# Patient Record
Sex: Female | Born: 1965
Health system: Southern US, Community
[De-identification: ages and names within clinical notes are randomized; demographics above are authoritative.]

## PROBLEM LIST (undated history)

## (undated) DIAGNOSIS — E785 Hyperlipidemia, unspecified: Secondary | ICD-10-CM

## (undated) DIAGNOSIS — N920 Excessive and frequent menstruation with regular cycle: Secondary | ICD-10-CM

## (undated) DIAGNOSIS — G5602 Carpal tunnel syndrome, left upper limb: Secondary | ICD-10-CM

## (undated) DIAGNOSIS — I10 Essential (primary) hypertension: Secondary | ICD-10-CM

## (undated) DIAGNOSIS — K439 Ventral hernia without obstruction or gangrene: Secondary | ICD-10-CM

## (undated) DIAGNOSIS — K5900 Constipation, unspecified: Secondary | ICD-10-CM

## (undated) DIAGNOSIS — M25511 Pain in right shoulder: Secondary | ICD-10-CM

## (undated) DIAGNOSIS — R0989 Other specified symptoms and signs involving the circulatory and respiratory systems: Secondary | ICD-10-CM

## (undated) DIAGNOSIS — M5030 Other cervical disc degeneration, unspecified cervical region: Secondary | ICD-10-CM

## (undated) DIAGNOSIS — R1013 Epigastric pain: Secondary | ICD-10-CM

## (undated) DIAGNOSIS — L0292 Furuncle, unspecified: Secondary | ICD-10-CM

## (undated) DIAGNOSIS — R011 Cardiac murmur, unspecified: Secondary | ICD-10-CM

## (undated) DIAGNOSIS — T7840XA Allergy, unspecified, initial encounter: Secondary | ICD-10-CM

## (undated) DIAGNOSIS — M25551 Pain in right hip: Secondary | ICD-10-CM

## (undated) DIAGNOSIS — M654 Radial styloid tenosynovitis [de Quervain]: Secondary | ICD-10-CM

## (undated) DIAGNOSIS — R12 Heartburn: Secondary | ICD-10-CM

## (undated) DIAGNOSIS — Z124 Encounter for screening for malignant neoplasm of cervix: Secondary | ICD-10-CM

## (undated) DIAGNOSIS — N83201 Unspecified ovarian cyst, right side: Secondary | ICD-10-CM

## (undated) DIAGNOSIS — R569 Unspecified convulsions: Secondary | ICD-10-CM

## (undated) DIAGNOSIS — M47812 Spondylosis without myelopathy or radiculopathy, cervical region: Secondary | ICD-10-CM

## (undated) DIAGNOSIS — R002 Palpitations: Secondary | ICD-10-CM

## (undated) DIAGNOSIS — R634 Abnormal weight loss: Secondary | ICD-10-CM

## (undated) DIAGNOSIS — R51 Headache: Secondary | ICD-10-CM

## (undated) HISTORY — DX: Essential (primary) hypertension: I10

## (undated) HISTORY — DX: Other specified symptoms and signs involving the circulatory and respiratory systems: R09.89

## (undated) HISTORY — DX: Allergy, unspecified, initial encounter: T78.40XA

## (undated) HISTORY — DX: Hyperlipidemia, unspecified: E78.5

## (undated) HISTORY — DX: Epigastric pain: R10.13

## (undated) HISTORY — DX: Encounter for screening for malignant neoplasm of cervix: Z12.4

## (undated) HISTORY — DX: Pain in right shoulder: M25.511

## (undated) HISTORY — DX: Cardiac murmur, unspecified: R01.1

---
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
## 1998-03-03 ENCOUNTER — Emergency Department (HOSPITAL_COMMUNITY): Admission: EM | Admit: 1998-03-03 | Discharge: 1998-03-03 | Payer: Self-pay | Admitting: Emergency Medicine

## 2000-04-14 ENCOUNTER — Emergency Department (HOSPITAL_COMMUNITY): Admission: EM | Admit: 2000-04-14 | Discharge: 2000-04-14 | Payer: Self-pay | Admitting: Emergency Medicine

## 2000-04-18 ENCOUNTER — Encounter: Admission: RE | Admit: 2000-04-18 | Discharge: 2000-04-18 | Payer: Self-pay | Admitting: Internal Medicine

## 2000-05-16 ENCOUNTER — Encounter: Admission: RE | Admit: 2000-05-16 | Discharge: 2000-05-16 | Payer: Self-pay | Admitting: Internal Medicine

## 2000-05-30 ENCOUNTER — Encounter: Admission: RE | Admit: 2000-05-30 | Discharge: 2000-05-30 | Payer: Self-pay | Admitting: Internal Medicine

## 2001-03-30 ENCOUNTER — Encounter: Admission: RE | Admit: 2001-03-30 | Discharge: 2001-03-30 | Payer: Self-pay

## 2001-06-29 ENCOUNTER — Encounter: Admission: RE | Admit: 2001-06-29 | Discharge: 2001-06-29 | Payer: Self-pay | Admitting: Obstetrics

## 2002-03-06 ENCOUNTER — Encounter: Admission: RE | Admit: 2002-03-06 | Discharge: 2002-03-06 | Payer: Self-pay | Admitting: Internal Medicine

## 2002-05-15 ENCOUNTER — Encounter: Admission: RE | Admit: 2002-05-15 | Discharge: 2002-05-15 | Payer: Self-pay | Admitting: Internal Medicine

## 2002-05-17 ENCOUNTER — Encounter: Admission: RE | Admit: 2002-05-17 | Discharge: 2002-05-17 | Payer: Self-pay | Admitting: Internal Medicine

## 2002-06-18 ENCOUNTER — Encounter: Admission: RE | Admit: 2002-06-18 | Discharge: 2002-06-18 | Payer: Self-pay | Admitting: Internal Medicine

## 2002-06-27 ENCOUNTER — Encounter: Admission: RE | Admit: 2002-06-27 | Discharge: 2002-06-27 | Payer: Self-pay | Admitting: Internal Medicine

## 2003-08-20 ENCOUNTER — Encounter: Admission: RE | Admit: 2003-08-20 | Discharge: 2003-08-20 | Payer: Self-pay | Admitting: Internal Medicine

## 2003-12-08 ENCOUNTER — Emergency Department (HOSPITAL_COMMUNITY): Admission: EM | Admit: 2003-12-08 | Discharge: 2003-12-08 | Payer: Self-pay | Admitting: *Deleted

## 2004-03-02 ENCOUNTER — Emergency Department (HOSPITAL_COMMUNITY): Admission: EM | Admit: 2004-03-02 | Discharge: 2004-03-03 | Payer: Self-pay | Admitting: Emergency Medicine

## 2004-03-03 ENCOUNTER — Emergency Department (HOSPITAL_COMMUNITY): Admission: EM | Admit: 2004-03-03 | Discharge: 2004-03-03 | Payer: Self-pay | Admitting: Family Medicine

## 2004-03-06 ENCOUNTER — Emergency Department (HOSPITAL_COMMUNITY): Admission: EM | Admit: 2004-03-06 | Discharge: 2004-03-06 | Payer: Self-pay | Admitting: Emergency Medicine

## 2004-10-29 ENCOUNTER — Encounter (INDEPENDENT_AMBULATORY_CARE_PROVIDER_SITE_OTHER): Payer: Self-pay | Admitting: Internal Medicine

## 2004-10-29 ENCOUNTER — Ambulatory Visit (HOSPITAL_COMMUNITY): Admission: RE | Admit: 2004-10-29 | Discharge: 2004-10-29 | Payer: Self-pay | Admitting: *Deleted

## 2004-11-27 ENCOUNTER — Ambulatory Visit: Payer: Self-pay | Admitting: Internal Medicine

## 2005-10-15 ENCOUNTER — Ambulatory Visit: Payer: Self-pay | Admitting: Internal Medicine

## 2005-11-17 ENCOUNTER — Ambulatory Visit: Payer: Self-pay | Admitting: Internal Medicine

## 2005-11-24 ENCOUNTER — Ambulatory Visit: Payer: Self-pay | Admitting: Internal Medicine

## 2005-11-24 ENCOUNTER — Encounter (INDEPENDENT_AMBULATORY_CARE_PROVIDER_SITE_OTHER): Payer: Self-pay | Admitting: Internal Medicine

## 2006-05-20 DIAGNOSIS — I10 Essential (primary) hypertension: Secondary | ICD-10-CM | POA: Insufficient documentation

## 2006-05-20 DIAGNOSIS — J301 Allergic rhinitis due to pollen: Secondary | ICD-10-CM | POA: Insufficient documentation

## 2006-05-20 DIAGNOSIS — R519 Headache, unspecified: Secondary | ICD-10-CM | POA: Insufficient documentation

## 2006-05-20 DIAGNOSIS — R51 Headache: Secondary | ICD-10-CM

## 2006-06-14 DIAGNOSIS — E785 Hyperlipidemia, unspecified: Secondary | ICD-10-CM

## 2006-06-14 DIAGNOSIS — G8929 Other chronic pain: Secondary | ICD-10-CM

## 2006-06-14 HISTORY — DX: Hyperlipidemia, unspecified: E78.5

## 2006-06-14 HISTORY — DX: Other chronic pain: G89.29

## 2007-01-17 ENCOUNTER — Telehealth (INDEPENDENT_AMBULATORY_CARE_PROVIDER_SITE_OTHER): Payer: Self-pay | Admitting: *Deleted

## 2007-02-15 ENCOUNTER — Telehealth (INDEPENDENT_AMBULATORY_CARE_PROVIDER_SITE_OTHER): Payer: Self-pay | Admitting: *Deleted

## 2007-03-06 ENCOUNTER — Encounter (INDEPENDENT_AMBULATORY_CARE_PROVIDER_SITE_OTHER): Payer: Self-pay | Admitting: Internal Medicine

## 2007-03-13 ENCOUNTER — Encounter (INDEPENDENT_AMBULATORY_CARE_PROVIDER_SITE_OTHER): Payer: Self-pay | Admitting: Internal Medicine

## 2007-03-13 ENCOUNTER — Ambulatory Visit: Payer: Self-pay | Admitting: Internal Medicine

## 2007-03-14 LAB — CONVERTED CEMR LAB
ALT: 18 units/L (ref 0–35)
AST: 16 units/L (ref 0–37)
Alkaline Phosphatase: 50 units/L (ref 39–117)
CO2: 24 meq/L (ref 19–32)
Calcium: 9.3 mg/dL (ref 8.4–10.5)
Chloride: 103 meq/L (ref 96–112)
Creatinine, Ser: 0.72 mg/dL (ref 0.40–1.20)
Eosinophils Relative: 3 % (ref 0–5)
Lymphocytes Relative: 25 % (ref 12–46)
Lymphs Abs: 3.6 10*3/uL — ABNORMAL HIGH (ref 0.7–3.3)
MCV: 94.1 fL (ref 78.0–100.0)
Monocytes Absolute: 0.7 10*3/uL (ref 0.2–0.7)
Monocytes Relative: 5 % (ref 3–11)
Platelets: 392 10*3/uL (ref 150–400)
Potassium: 3.9 meq/L (ref 3.5–5.3)
RBC: 4.21 M/uL (ref 3.87–5.11)
RDW: 14.1 % — ABNORMAL HIGH (ref 11.5–14.0)
TSH: 1.554 microintl units/mL (ref 0.350–5.50)
Total Bilirubin: 0.2 mg/dL — ABNORMAL LOW (ref 0.3–1.2)

## 2007-03-22 ENCOUNTER — Ambulatory Visit: Payer: Self-pay | Admitting: Infectious Diseases

## 2007-03-22 ENCOUNTER — Encounter (INDEPENDENT_AMBULATORY_CARE_PROVIDER_SITE_OTHER): Payer: Self-pay | Admitting: Internal Medicine

## 2007-03-23 LAB — CONVERTED CEMR LAB
Calcium: 10.4 mg/dL (ref 8.4–10.5)
HDL: 38 mg/dL — ABNORMAL LOW (ref >39)
LDL Cholesterol: 159 mg/dL — ABNORMAL HIGH (ref 0–99)
Sodium: 138 meq/L (ref 135–145)
Triglycerides: 111 mg/dL (ref <150)

## 2007-04-07 ENCOUNTER — Encounter (INDEPENDENT_AMBULATORY_CARE_PROVIDER_SITE_OTHER): Payer: Self-pay | Admitting: Internal Medicine

## 2007-04-07 ENCOUNTER — Ambulatory Visit: Payer: Self-pay | Admitting: Infectious Diseases

## 2007-04-07 DIAGNOSIS — N920 Excessive and frequent menstruation with regular cycle: Secondary | ICD-10-CM | POA: Insufficient documentation

## 2007-04-07 DIAGNOSIS — E785 Hyperlipidemia, unspecified: Secondary | ICD-10-CM | POA: Insufficient documentation

## 2007-04-13 ENCOUNTER — Ambulatory Visit (HOSPITAL_COMMUNITY): Admission: RE | Admit: 2007-04-13 | Discharge: 2007-04-13 | Payer: Self-pay | Admitting: Internal Medicine

## 2007-05-09 ENCOUNTER — Encounter: Admission: RE | Admit: 2007-05-09 | Discharge: 2007-05-09 | Payer: Self-pay | Admitting: Internal Medicine

## 2007-05-25 ENCOUNTER — Ambulatory Visit: Payer: Self-pay | Admitting: Internal Medicine

## 2007-05-26 ENCOUNTER — Encounter (INDEPENDENT_AMBULATORY_CARE_PROVIDER_SITE_OTHER): Payer: Self-pay | Admitting: Internal Medicine

## 2007-07-07 ENCOUNTER — Ambulatory Visit: Payer: Self-pay | Admitting: Infectious Disease

## 2007-07-12 ENCOUNTER — Ambulatory Visit: Payer: Self-pay | Admitting: Obstetrics and Gynecology

## 2007-07-14 ENCOUNTER — Telehealth (INDEPENDENT_AMBULATORY_CARE_PROVIDER_SITE_OTHER): Payer: Self-pay | Admitting: Internal Medicine

## 2007-07-17 ENCOUNTER — Telehealth: Payer: Self-pay | Admitting: Internal Medicine

## 2007-07-18 ENCOUNTER — Encounter (INDEPENDENT_AMBULATORY_CARE_PROVIDER_SITE_OTHER): Payer: Self-pay | Admitting: Internal Medicine

## 2007-09-19 ENCOUNTER — Encounter (INDEPENDENT_AMBULATORY_CARE_PROVIDER_SITE_OTHER): Payer: Self-pay | Admitting: Internal Medicine

## 2007-09-28 ENCOUNTER — Ambulatory Visit: Payer: Self-pay | Admitting: Infectious Disease

## 2007-11-14 ENCOUNTER — Telehealth (INDEPENDENT_AMBULATORY_CARE_PROVIDER_SITE_OTHER): Payer: Self-pay | Admitting: Internal Medicine

## 2007-12-27 ENCOUNTER — Ambulatory Visit: Payer: Self-pay | Admitting: Infectious Diseases

## 2008-01-16 ENCOUNTER — Telehealth: Payer: Self-pay | Admitting: *Deleted

## 2008-01-18 ENCOUNTER — Telehealth: Payer: Self-pay | Admitting: *Deleted

## 2008-02-22 ENCOUNTER — Telehealth: Payer: Self-pay | Admitting: *Deleted

## 2008-03-07 ENCOUNTER — Encounter (INDEPENDENT_AMBULATORY_CARE_PROVIDER_SITE_OTHER): Payer: Self-pay | Admitting: Internal Medicine

## 2008-03-07 ENCOUNTER — Ambulatory Visit: Payer: Self-pay | Admitting: Internal Medicine

## 2008-03-07 LAB — CONVERTED CEMR LAB
ALT: 14 units/L (ref 0–35)
BUN: 13 mg/dL (ref 6–23)
CO2: 23 meq/L (ref 19–32)
Chloride: 101 meq/L (ref 96–112)
Creatinine, Ser: 0.95 mg/dL (ref 0.40–1.20)
Glucose, Bld: 87 mg/dL (ref 70–99)
Potassium: 4.1 meq/L (ref 3.5–5.3)
Total Bilirubin: 0.3 mg/dL (ref 0.3–1.2)
Total Protein: 8 g/dL (ref 6.0–8.3)

## 2008-03-19 ENCOUNTER — Ambulatory Visit: Payer: Self-pay | Admitting: Internal Medicine

## 2008-03-19 ENCOUNTER — Encounter (INDEPENDENT_AMBULATORY_CARE_PROVIDER_SITE_OTHER): Payer: Self-pay | Admitting: Internal Medicine

## 2008-03-21 LAB — CONVERTED CEMR LAB
Cholesterol: 215 mg/dL — ABNORMAL HIGH (ref 0–200)
HDL: 38 mg/dL — ABNORMAL LOW (ref >39)
LDL Cholesterol: 151 mg/dL — ABNORMAL HIGH (ref 0–99)
Total CHOL/HDL Ratio: 5.7
VLDL: 26 mg/dL (ref 0–40)

## 2008-05-14 ENCOUNTER — Encounter: Admission: RE | Admit: 2008-05-14 | Discharge: 2008-05-14 | Payer: Self-pay | Admitting: Internal Medicine

## 2008-05-14 LAB — HM MAMMOGRAPHY

## 2008-05-21 ENCOUNTER — Encounter (INDEPENDENT_AMBULATORY_CARE_PROVIDER_SITE_OTHER): Payer: Self-pay | Admitting: Internal Medicine

## 2008-05-28 ENCOUNTER — Ambulatory Visit: Payer: Self-pay | Admitting: Internal Medicine

## 2008-05-28 ENCOUNTER — Ambulatory Visit (HOSPITAL_COMMUNITY): Admission: RE | Admit: 2008-05-28 | Discharge: 2008-05-28 | Payer: Self-pay | Admitting: Internal Medicine

## 2008-05-28 DIAGNOSIS — R002 Palpitations: Secondary | ICD-10-CM | POA: Insufficient documentation

## 2008-06-10 ENCOUNTER — Ambulatory Visit: Payer: Self-pay | Admitting: Infectious Diseases

## 2008-06-11 LAB — CONVERTED CEMR LAB
ALT: 14 units/L (ref 0–35)
AST: 13 units/L (ref 0–37)
Albumin: 4.5 g/dL (ref 3.5–5.2)
BUN: 11 mg/dL (ref 6–23)
CO2: 21 meq/L (ref 19–32)
Cholesterol: 180 mg/dL (ref 0–200)
Creatinine, Ser: 0.65 mg/dL (ref 0.40–1.20)
Eosinophils Relative: 3 % (ref 0–5)
HCT: 38.5 % (ref 36.0–46.0)
HDL: 38 mg/dL — ABNORMAL LOW (ref >39)
MCHC: 32.2 g/dL (ref 30.0–36.0)
Monocytes Absolute: 1.1 10*3/uL — ABNORMAL HIGH (ref 0.1–1.0)
Platelets: 351 10*3/uL (ref 150–400)
RBC: 4.14 M/uL (ref 3.87–5.11)
RDW: 13.6 % (ref 11.5–15.5)
Total Bilirubin: 0.2 mg/dL — ABNORMAL LOW (ref 0.3–1.2)
Total Protein: 7.6 g/dL (ref 6.0–8.3)
WBC: 13.5 10*3/uL — ABNORMAL HIGH (ref 4.0–10.5)

## 2008-07-15 DIAGNOSIS — M25511 Pain in right shoulder: Secondary | ICD-10-CM

## 2008-07-15 HISTORY — DX: Pain in right shoulder: M25.511

## 2008-07-23 ENCOUNTER — Encounter: Payer: Self-pay | Admitting: Internal Medicine

## 2008-07-23 ENCOUNTER — Ambulatory Visit (HOSPITAL_COMMUNITY): Admission: RE | Admit: 2008-07-23 | Discharge: 2008-07-23 | Payer: Self-pay | Admitting: Internal Medicine

## 2008-07-23 ENCOUNTER — Ambulatory Visit: Payer: Self-pay | Admitting: Internal Medicine

## 2008-07-23 DIAGNOSIS — M25519 Pain in unspecified shoulder: Secondary | ICD-10-CM | POA: Insufficient documentation

## 2008-07-23 LAB — CONVERTED CEMR LAB
AST: 18 units/L (ref 0–37)
Chloride: 102 meq/L (ref 96–112)
Glucose, Bld: 96 mg/dL (ref 70–99)
Sodium: 138 meq/L (ref 135–145)
Total Protein: 8.2 g/dL (ref 6.0–8.3)

## 2008-08-08 ENCOUNTER — Ambulatory Visit: Payer: Self-pay | Admitting: Internal Medicine

## 2008-08-08 ENCOUNTER — Encounter (INDEPENDENT_AMBULATORY_CARE_PROVIDER_SITE_OTHER): Payer: Self-pay | Admitting: Internal Medicine

## 2008-08-08 LAB — HM PAP SMEAR: HM Pap smear: NEGATIVE

## 2008-08-15 ENCOUNTER — Telehealth: Payer: Self-pay | Admitting: *Deleted

## 2008-08-16 ENCOUNTER — Telehealth (INDEPENDENT_AMBULATORY_CARE_PROVIDER_SITE_OTHER): Payer: Self-pay | Admitting: Internal Medicine

## 2008-08-26 ENCOUNTER — Telehealth (INDEPENDENT_AMBULATORY_CARE_PROVIDER_SITE_OTHER): Payer: Self-pay | Admitting: Internal Medicine

## 2008-10-31 ENCOUNTER — Ambulatory Visit: Payer: Self-pay | Admitting: Internal Medicine

## 2008-11-05 ENCOUNTER — Telehealth (INDEPENDENT_AMBULATORY_CARE_PROVIDER_SITE_OTHER): Payer: Self-pay | Admitting: Internal Medicine

## 2008-11-08 ENCOUNTER — Ambulatory Visit (HOSPITAL_COMMUNITY): Admission: RE | Admit: 2008-11-08 | Discharge: 2008-11-08 | Payer: Self-pay | Admitting: Internal Medicine

## 2008-11-12 ENCOUNTER — Encounter (INDEPENDENT_AMBULATORY_CARE_PROVIDER_SITE_OTHER): Payer: Self-pay | Admitting: Internal Medicine

## 2008-11-22 ENCOUNTER — Telehealth (INDEPENDENT_AMBULATORY_CARE_PROVIDER_SITE_OTHER): Payer: Self-pay | Admitting: Internal Medicine

## 2008-12-02 ENCOUNTER — Encounter: Admission: RE | Admit: 2008-12-02 | Discharge: 2008-12-11 | Payer: Self-pay | Admitting: Internal Medicine

## 2008-12-05 ENCOUNTER — Telehealth: Payer: Self-pay | Admitting: *Deleted

## 2008-12-17 ENCOUNTER — Telehealth: Payer: Self-pay | Admitting: Internal Medicine

## 2009-01-07 ENCOUNTER — Encounter: Payer: Self-pay | Admitting: Internal Medicine

## 2009-01-24 ENCOUNTER — Telehealth: Payer: Self-pay | Admitting: *Deleted

## 2009-01-24 ENCOUNTER — Emergency Department (HOSPITAL_COMMUNITY): Admission: EM | Admit: 2009-01-24 | Discharge: 2009-01-24 | Payer: Self-pay | Admitting: Emergency Medicine

## 2009-01-27 ENCOUNTER — Encounter: Payer: Self-pay | Admitting: Internal Medicine

## 2009-01-29 ENCOUNTER — Ambulatory Visit: Payer: Self-pay | Admitting: Internal Medicine

## 2009-03-03 ENCOUNTER — Ambulatory Visit: Payer: Self-pay | Admitting: Internal Medicine

## 2009-03-03 DIAGNOSIS — M25569 Pain in unspecified knee: Secondary | ICD-10-CM | POA: Insufficient documentation

## 2009-06-26 ENCOUNTER — Ambulatory Visit: Payer: Self-pay | Admitting: Internal Medicine

## 2009-08-01 ENCOUNTER — Ambulatory Visit: Payer: Self-pay | Admitting: Internal Medicine

## 2009-08-01 LAB — CONVERTED CEMR LAB
Albumin: 4.3 g/dL (ref 3.5–5.2)
Chloride: 106 meq/L (ref 96–112)
Cholesterol: 160 mg/dL (ref 0–200)
Creatinine, Ser: 0.69 mg/dL (ref 0.40–1.20)
Glucose, Bld: 82 mg/dL (ref 70–99)
HDL: 41 mg/dL (ref >39)
LDL Cholesterol: 97 mg/dL (ref 0–99)
Potassium: 4.3 meq/L (ref 3.5–5.3)
Sodium: 141 meq/L (ref 135–145)
Total CHOL/HDL Ratio: 3.9
Total Protein: 7.2 g/dL (ref 6.0–8.3)
Triglycerides: 111 mg/dL (ref <150)
VLDL: 22 mg/dL (ref 0–40)

## 2009-10-24 ENCOUNTER — Telehealth (INDEPENDENT_AMBULATORY_CARE_PROVIDER_SITE_OTHER): Payer: Self-pay | Admitting: Internal Medicine

## 2009-11-07 ENCOUNTER — Telehealth (INDEPENDENT_AMBULATORY_CARE_PROVIDER_SITE_OTHER): Payer: Self-pay | Admitting: Internal Medicine

## 2009-11-16 DIAGNOSIS — N83201 Unspecified ovarian cyst, right side: Secondary | ICD-10-CM

## 2009-11-16 HISTORY — DX: Unspecified ovarian cyst, right side: N83.201

## 2009-11-20 ENCOUNTER — Ambulatory Visit: Payer: Self-pay | Admitting: Internal Medicine

## 2009-11-20 LAB — CONVERTED CEMR LAB
HCT: 35.6 % — ABNORMAL LOW (ref 36.0–46.0)
Hemoglobin: 11.3 g/dL — ABNORMAL LOW (ref 12.0–15.0)
MCHC: 31.7 g/dL (ref 30.0–36.0)

## 2009-11-26 ENCOUNTER — Ambulatory Visit (HOSPITAL_COMMUNITY): Admission: RE | Admit: 2009-11-26 | Discharge: 2009-11-26 | Payer: Self-pay | Admitting: Internal Medicine

## 2010-01-15 ENCOUNTER — Other Ambulatory Visit: Admission: RE | Admit: 2010-01-15 | Discharge: 2010-01-15 | Payer: Self-pay | Admitting: Obstetrics & Gynecology

## 2010-01-15 ENCOUNTER — Ambulatory Visit: Payer: Self-pay | Admitting: Obstetrics & Gynecology

## 2010-05-15 ENCOUNTER — Telehealth: Payer: Self-pay | Admitting: Internal Medicine

## 2010-06-11 ENCOUNTER — Emergency Department (HOSPITAL_COMMUNITY)
Admission: EM | Admit: 2010-06-11 | Discharge: 2010-06-11 | Payer: Self-pay | Source: Home / Self Care | Admitting: Family Medicine

## 2010-06-17 ENCOUNTER — Telehealth: Payer: Self-pay | Admitting: Internal Medicine

## 2010-07-07 ENCOUNTER — Telehealth (INDEPENDENT_AMBULATORY_CARE_PROVIDER_SITE_OTHER): Payer: Self-pay | Admitting: *Deleted

## 2010-07-14 NOTE — Progress Notes (Signed)
Summary: refill/ hla  Phone Note Refill Request Message from:  Patient on Nov 07, 2009 11:18 AM  Refills Requested: Medication #1:  PRAVASTATIN SODIUM 20 MG TABS Take 1 tablet by mouth at bedtime for your cholesterol   Dosage confirmed as above?Dosage Confirmed pt has appt 6/9 at 1450, she will come to appt npo except h2o x 6-8 hrs for possible labs. last appt and labs 07/2009  Initial call taken by: Marin Roberts RN,  Nov 07, 2009 11:18 AM  Follow-up for Phone Call       Follow-up by: Silvestre Gunner MD,  Nov 09, 2009 3:36 PM    Prescriptions: PRAVASTATIN SODIUM 20 MG TABS (PRAVASTATIN SODIUM) Take 1 tablet by mouth at bedtime for your cholesterol  #30 x 6   Entered and Authorized by:   Silvestre Gunner MD   Signed by:   Silvestre Gunner MD on 11/09/2009   Method used:   Electronically to        [facility] [phone]* (retail)       [address]       [place], [place]  [address]       Ph: [phone]       Fax: [phone]   RxID:   [id number]

## 2010-07-14 NOTE — Progress Notes (Signed)
Summary: refill/gg  Phone Note Refill Request  on Oct 24, 2009 12:44 PM  Refills Requested: Medication #1:  LISINOPRIL 20 MG TABS Take one tablet daily for blood pressure..  Method Requested: Electronic Initial call taken by: Merrie Roof RN,  Oct 24, 2009 12:44 PM  Follow-up for Phone Call       Follow-up by: Silvestre Gunner MD,  Oct 25, 2009 10:34 AM    Prescriptions: LISINOPRIL 20 MG TABS (LISINOPRIL) Take one tablet daily for blood pressure.  #32 x 5   Entered and Authorized by:   Silvestre Gunner MD   Signed by:   Silvestre Gunner MD on 10/25/2009   Method used:   Electronically to        [facility] [phone]* (retail)       [address]       [place], [place]  [address]       Ph: [phone]       Fax: [phone]   RxID:   [id number]

## 2010-07-14 NOTE — Assessment & Plan Note (Signed)
Summary: checkup, labs/pcp-Audri Kozub/hla   Vital Signs:  Patient profile:   45 year old female Height:      66.75 inches Weight:      185.1 pounds BMI:     29.31 Temp:     98.3 degrees F oral Pulse rate:   92 / minute BP sitting:   117 / 75  (right arm)  Vitals Entered By: Filomena Jungling NT II (November 20, 2009 3:03 PM) CC: HEAVY PERIODS/ STAYED ON APRIL THROUH MAY /  CRAMPS/  KNOT  ABOVE  BELLY BOTTON Is Patient Diabetic? No Nutritional Status BMI of 25 - 29 = overweight  Have you ever been in a relationship where you felt threatened, hurt or afraid?No   Does patient need assistance? Functional Status Self care Ambulation Normal   CC:  HEAVY PERIODS/ STAYED ON APRIL THROUH MAY /  CRAMPS/  KNOT  ABOVE  BELLY BOTTON.  History of Present Illness: 44yof with pmh outlined in chart.  She is here with  1) has had heavy periods.  She reports starting menstrating in early April and bleeding continuously until mid May.  She started again recently.  She says that she has to change pads every 1-2 hours.  She is passing mostly bright red blood, no clots.  She has had heavy periods in the past, but usually they do not last this long.  She is feeling a little fatigued.  No syncope, palpatations.  No known family history of fibroids, endometrial or ovarian cancer.  No personal history of thyroid problems.  She does think that she is entering menopause because she is having night sweats.  2) a new bump on her abdomen that is occasionally tender.    Preventive Screening-Counseling & Management  Alcohol-Tobacco     Smoking Status: current     Smoking Cessation Counseling: yes     Packs/Day: 1/2     Year Started: AT THE AGE OF 16     Passive Smoke Exposure: yes  Caffeine-Diet-Exercise     Does Patient Exercise: no  Current Medications (verified): 1)  Multivitamins   Tabs (Multiple Vitamin) .... Take 1 Tablet By Mouth Once A Day 2)  Calcium 600/vitamin D 600-200 Mg-Unit  Tabs (Calcium  Carbonate-Vitamin D) .... Take 1 Tablet By Mouth Two Times A Day 3)  Pravastatin Sodium 20 Mg Tabs (Pravastatin Sodium) .... Take 1 Tablet By Mouth At Bedtime For Your Cholesterol 4)  Lisinopril 20 Mg Tabs (Lisinopril) .... Take One Tablet Daily For Blood Pressure.  Allergies (verified): 1)  ! Pcn 2)  ! * Bananas  Review of Systems       per hpi  Physical Exam  General:  alert and well-developed.   Lungs:  normal respiratory effort and normal breath sounds.   Heart:  normal rate, regular rhythm, no murmur, and no gallop.   Abdomen:  soft, non-tender, normal bowel sounds, and no distention.  there is a 4cmx4cm well circumscribed, mobile, soft mass in the midabdomenal adipose tissue, it does not protrude with sitting up, no tenderness, no skin changes. Pulses:  2+ Extremities:  no edema Neurologic:  alert & oriented X3, cranial nerves II-XII intact, and gait normal.   Psych:  Oriented X3, memory intact for recent and remote, normally interactive, good eye contact, and not anxious appearing.     Impression & Recommendations:  Problem # 1:  MENORRHAGIA (ICD-626.2) needs work up for AUB.  will order transvag ultrasound (she has not had one in 3 years),  CBC, TSH and FSH. Will refer to gyn for further eval and posible endometrial bx.  Orders: T-CBC No Diff ([id number]) T-FSH ([id number]) T-TSH ([id number]) Ultrasound (Ultrasound) Gynecologic Referral (Gyn)  Problem # 2:  HYPERLIPIDEMIA (ICD-272.4) lipids great at last check.  Her updated medication list for this problem includes:    Pravastatin Sodium 20 Mg Tabs (Pravastatin sodium) .Marland Kitchen... Take 1 tablet by mouth at bedtime for your cholesterol  Labs Reviewed: SGOT: 15 (08/01/2009)   SGPT: 17 (08/01/2009)   HDL:41 (08/01/2009), 38 (05/28/2008)  LDL:97 (08/01/2009), 128 (53/66/4403)  Chol:160 (08/01/2009), 180 (05/28/2008)  Trig:111 (08/01/2009), 72 (05/28/2008)  Problem # 3:  HYPERTENSION (ICD-401.9) BP controled.  No  changes.  Her updated medication list for this problem includes:    Lisinopril 20 Mg Tabs (Lisinopril) .Marland Kitchen... Take one tablet daily for blood pressure.  BP today: 117/75 Prior BP: 152/95 (08/01/2009)  Labs Reviewed: K+: 4.3 (08/01/2009) Creat: : 0.69 (08/01/2009)   Chol: 160 (08/01/2009)   HDL: 41 (08/01/2009)   LDL: 97 (08/01/2009)   TG: 111 (08/01/2009)  Problem # 4:  TOBACCO ABUSE (ICD-305.1) still smokes 1/2 ppd. not ready to quit.  Complete Medication List: 1)  Multivitamins Tabs (Multiple vitamin) .... Take 1 tablet by mouth once a day 2)  Calcium 600/vitamin D 600-200 Mg-unit Tabs (Calcium carbonate-vitamin d) .... Take 1 tablet by mouth two times a day 3)  Pravastatin Sodium 20 Mg Tabs (Pravastatin sodium) .... Take 1 tablet by mouth at bedtime for your cholesterol 4)  Lisinopril 20 Mg Tabs (Lisinopril) .... Take one tablet daily for blood pressure.  Patient Instructions: 1)  You had labwork done today, we will call you if there is anything that needs to be addressed. 2)  You will be set up for an ultrasound of your abdomen. 3)  You will be refered to [facility] for further evaluation of bleeding.  Prevention & Chronic Care Immunizations   Influenza vaccine: refuses  (03/07/2008)   Influenza vaccine deferral: Refused  (08/01/2009)    Tetanus booster: Not documented    Pneumococcal vaccine: Not documented  Other Screening   Pap smear: Specimen Adequacy: Satisfactory for evaluation.   Interpretation/Result:Negative for intraepithelial Lesion or Malignancy.   Location: [facility].    (08/08/2008)   Pap smear due: 08/2009    Mammogram: No specific mammographic evidence of malignancy.  Assessment: BIRADS 1.   (05/14/2008)   Mammogram action/deferral: Ordered  (08/01/2009)   Mammogram due: 06/04/2010   Smoking status: current  (11/20/2009)   Smoking cessation counseling: yes  (11/20/2009)  Lipids   Total Cholesterol: 160  (08/01/2009)   Lipid  panel action/deferral: Lipid Panel ordered   LDL: 97  (08/01/2009)   LDL Direct: Not documented   HDL: 41  (08/01/2009)   Triglycerides: 111  (08/01/2009)    SGOT (AST): 15  (08/01/2009)   BMP action: Ordered   SGPT (ALT): 17  (08/01/2009)   Alkaline phosphatase: 59  (08/01/2009)   Total bilirubin: 0.2  (08/01/2009)    Lipid flowsheet reviewed?: Yes   Progress toward LDL goal: Unchanged  Hypertension   Last Blood Pressure: 117 / 75  (11/20/2009)   Serum creatinine: 0.69  (08/01/2009)   BMP action: Ordered   Serum potassium 4.3  (08/01/2009)    Hypertension flowsheet reviewed?: Yes   Progress toward BP goal: At goal  Self-Management Support :    Patient will work on the following items until the next clinic visit to reach self-care goals:  Medications and monitoring: take my medicines every day, bring all of my medications to every visit  (11/20/2009)     Eating: drink diet soda or water instead of juice or soda, eat more vegetables, use fresh or frozen vegetables, eat foods that are low in salt, eat baked foods instead of fried foods, eat fruit for snacks and desserts  (11/20/2009)     Activity: take a 30 minute walk every day  (11/20/2009)    Hypertension self-management support: Education handout, Resources for patients handout  (11/20/2009)   Hypertension education handout printed    Lipid self-management support: Education handout, Resources for patients handout  (11/20/2009)     Lipid education handout printed      Resource handout printed.   Process Orders Check Orders Results:     [facility]: ABN not required for this insurance Tests Sent for requisitioning (November 21, 2009 3:55 PM):     11/20/2009: [facility] -- T-CBC No Diff [[id number]] (signed)     11/20/2009: [facility] -- T-FSH [[id number]] (signed)     11/20/2009: [facility] -- T-TSH [phone] (signed)

## 2010-07-14 NOTE — Assessment & Plan Note (Signed)
Summary: ACUTE-BP CHECK AND MEDICATION/([facility])/CFB   Vital Signs:  Patient profile:   45 year old female Height:      66.75 inches (169.55 cm) Weight:      183.8 pounds (83.55 kg) BMI:     29.11 Temp:     97.4 degrees F (36.33 degrees C) oral Pulse rate:   84 / minute BP sitting:   142 / 94  (right arm)  Vitals Entered By: Stanton Kidney Ditzler RN (June 26, 2009 4:23 PM) Is Patient Diabetic? No Pain Assessment Patient in pain? no      Nutritional Status BMI of 25 - 29 = overweight Nutritional Status Detail appetite good  Have you ever been in a relationship where you felt threatened, hurt or afraid?denies   Does patient need assistance? Functional Status Self care Ambulation Normal Comments FU BP - since off med - h/a are back.   History of Present Illness: This is a  45  year old woman with past medical history of   Hypertension Chronic headaches, in remission 9/09 Seasonal allergies Tobacco abuse, 1/2 ppd x 20yrs Hyperlipidemia, trying diet control Hx of a heart murmur as a child Right shoulder pain 07/2008  She is here today for BP check and to discuss headaches.  She was taken off of lisnipril hctz in 8/10 due to lower BP's.  Soon after being off the medications her headaches returned.  She has had no vision changes, no specific neurosymptoms.  She has no other complaints.  Headaches occur at anytime of day, occur in many different spots around her head, are usually throbbing and sharp.  No vision change, no aura or prodrome.  No photo or phono phobia.    Depression History:      The patient denies a depressed mood most of the day and a diminished interest in her usual daily activities.         Preventive Screening-Counseling & Management  Alcohol-Tobacco     Smoking Status: current     Smoking Cessation Counseling: yes     Packs/Day: 1/2     Year Started: AT THE AGE OF 16     Passive Smoke Exposure: yes  Caffeine-Diet-Exercise     Does Patient Exercise:  no  Current Medications (verified): 1)  Multivitamins   Tabs (Multiple Vitamin) .... Take 1 Tablet By Mouth Once A Day 2)  Calcium 600/vitamin D 600-200 Mg-Unit  Tabs (Calcium Carbonate-Vitamin D) .... Take 1 Tablet By Mouth Two Times A Day 3)  Pravastatin Sodium 20 Mg Tabs (Pravastatin Sodium) .... Take 1 Tablet By Mouth At Bedtime For Your Cholesterol  Allergies: 1)  ! Pcn 2)  ! * Bananas  Review of Systems       per hpi  Physical Exam  General:  alert and well-developed.   Eyes:  vision grossly intact, pupils equal, pupils round, and pupils reactive to light.   Mouth:  pharynx pink and moist.   Neck:  supple, full ROM, and no masses.   Lungs:  normal respiratory effort and normal breath sounds.   Heart:  normal rate, regular rhythm, and no murmur.   Extremities:  no edema Neurologic:  alert & oriented X3, cranial nerves II-XII intact, strength normal in all extremities, and gait normal.   Psych:  Oriented X3, memory intact for recent and remote, normally interactive, and good eye contact.     Impression & Recommendations:  Problem # 1:  HYPERTENSION (ICD-401.9) BP elevated.  Will restart lisinipril.  She will  rtc in 2 weeks for BP check.  Will need CMET and lipids at that time too so can check K that way.  BP today: 142/94 Prior BP: 147/91 (03/03/2009)  Labs Reviewed: K+: 4.4 (07/23/2008) Creat: : 0.60 (07/23/2008)   Chol: 180 (05/28/2008)   HDL: 38 (05/28/2008)   LDL: 128 (05/28/2008)   TG: 72 (05/28/2008)  Her updated medication list for this problem includes:    Lisinopril 20 Mg Tabs (Lisinopril) .Marland Kitchen... Take one tablet daily for blood pressure.  Problem # 2:  HYPERLIPIDEMIA (ICD-272.4)  Will need CMET and lipids at next visit in 2 weeks.  Her updated medication list for this problem includes:    Pravastatin Sodium 20 Mg Tabs (Pravastatin sodium) .Marland Kitchen... Take 1 tablet by mouth at bedtime for your cholesterol  Labs Reviewed: SGOT: 18 (07/23/2008)   SGPT: 24  (07/23/2008)   HDL:38 (05/28/2008), 38 (03/19/2008)  LDL:128 (05/28/2008), 151 (03/19/2008)  Chol:180 (05/28/2008), 215 (03/19/2008)  Trig:72 (05/28/2008), 130 (03/19/2008)  Future Orders: T-Lipid Profile ([id number]) ... 07/10/2009  Problem # 3:  HEADACHE, CHRONIC (ICD-784.0) Difficult to determine what kind of headache this is as symptoms are so variable.  Does not seem to be mrgraine.  Will treat BP and see if this improves the headache.  The following medications were removed from the medication list:    Propoxyphene N-apap 100-650 Mg Tabs (Propoxyphene n-apap) .Marland Kitchen... Take 1 tablet by mouth up to every 6 hours as needed for pain  Complete Medication List: 1)  Multivitamins Tabs (Multiple vitamin) .... Take 1 tablet by mouth once a day 2)  Calcium 600/vitamin D 600-200 Mg-unit Tabs (Calcium carbonate-vitamin d) .... Take 1 tablet by mouth two times a day 3)  Pravastatin Sodium 20 Mg Tabs (Pravastatin sodium) .... Take 1 tablet by mouth at bedtime for your cholesterol 4)  Lisinopril 20 Mg Tabs (Lisinopril) .... Take one tablet daily for blood pressure.  Other Orders: Future Orders: T-Comprehensive Metabolic Panel ([id number]) ... 07/10/2009  Patient Instructions: 1)  Please schedule a follow-up appointment in 2 weeks for blood pressure check and labs. 2)  You have a new prescription for lisinopril for blood pressure. Prescriptions: LISINOPRIL 20 MG TABS (LISINOPRIL) Take one tablet daily for blood pressure.  #32 x 3   Entered and Authorized by:   Elby Showers MD   Signed by:   Elby Showers MD on 06/26/2009   Method used:   Electronically to        [facility] [phone]* (retail)       [address]       [place], [place]  [address]       Ph: [phone]       Fax: [phone]   RxID:   [id number]  Process Orders Check Orders Results:     [facility]: Order checked:     Elby Showers MD NOT AUTHORIZED TO ORDER Tests Sent for requisitioning  (June 26, 2009 8:19 PM):     07/10/2009: [facility] -- T-Lipid Profile [phone] (signed)     07/10/2009: [facility] -- T-Comprehensive Metabolic Panel [phone] (signed)    Prevention & Chronic Care Immunizations   Influenza vaccine: refuses  (03/07/2008)    Tetanus booster: Not documented    Pneumococcal vaccine: Not documented  Other Screening   Pap smear: Specimen Adequacy: Satisfactory for evaluation.   Interpretation/Result:Negative for intraepithelial Lesion or Malignancy.   Location: [facility].    (08/08/2008)   Pap smear due: 08/2009  Mammogram: No specific mammographic evidence of malignancy.  Assessment: BIRADS 1.   (05/14/2008)   Mammogram action/deferral: Screening mammogram in 1 year.     (05/14/2008)   Mammogram due: 06/04/2010   Smoking status: current  (06/26/2009)   Smoking cessation counseling: yes  (06/26/2009)  Lipids   Total Cholesterol: 180  (05/28/2008)   Lipid panel action/deferral: Lipid Panel ordered   LDL: 128  (05/28/2008)   LDL Direct: Not documented   HDL: 38  (05/28/2008)   Triglycerides: 72  (05/28/2008)    SGOT (AST): 18  (07/23/2008)   BMP action: Ordered   SGPT (ALT): 24  (07/23/2008) CMP ordered    Alkaline phosphatase: 46  (07/23/2008)   Total bilirubin: 0.3  (07/23/2008)    Lipid flowsheet reviewed?: Yes   Progress toward LDL goal: Unchanged  Hypertension   Last Blood Pressure: 142 / 94  (06/26/2009)   Serum creatinine: 0.60  (07/23/2008)   BMP action: Ordered   Serum potassium 4.4  (07/23/2008) CMP ordered     Hypertension flowsheet reviewed?: Yes   Progress toward BP goal: Deteriorated  Self-Management Support :    Patient will work on the following items until the next clinic visit to reach self-care goals:     Medications and monitoring: take my medicines every day, check my blood pressure, bring all of my medications to every visit  (06/26/2009)      Eating: drink diet soda or water instead of juice or soda, eat more vegetables, use fresh or frozen vegetables, eat foods that are low in salt, eat fruit for snacks and desserts, limit or avoid alcohol  (06/26/2009)     Activity: take a 30 minute walk every day  (06/26/2009)    Hypertension self-management support: Written self-care plan  (03/03/2009)    Lipid self-management support: Not documented

## 2010-07-14 NOTE — Progress Notes (Signed)
Summary: refill/ hla  Phone Note Refill Request Message from:  Patient on May 15, 2010 3:03 PM  Refills Requested: Medication #1:  LISINOPRIL 20 MG TABS Take one tablet daily for blood pressure..   Dosage confirmed as above?Dosage Confirmed   Supply Requested: 3 months last visit/ labs 11/20/2009  Initial call taken by: Marin Roberts RN,  May 15, 2010 3:03 PM  Follow-up for Phone Call        Please schedule a follow-up appointment with Dr. Allena Katz at his next available appointment.  Thank You. Follow-up by: Doneen Poisson MD,  May 15, 2010 3:09 PM  Additional Follow-up for Phone Call Additional follow up Details #1::        Patient has been sch for an appointment with Dr. Allena Katz on 07/22/10 at 02/:15p.m.  Patient has also been notified by mail as well. Additional Follow-up by: Shon Hough,  May 18, 2010 2:52 PM    Prescriptions: LISINOPRIL 20 MG TABS (LISINOPRIL) Take one tablet daily for blood pressure.  #32 x 2   Entered and Authorized by:   Doneen Poisson MD   Signed by:   Doneen Poisson MD on 05/15/2010   Method used:   Electronically to        [facility] [phone]* (retail)       [address]       [place], [place]  [address]       Ph: [phone]       Fax: [phone]   RxID:   [id number]

## 2010-07-14 NOTE — Assessment & Plan Note (Signed)
Summary: 2wks f/u bp & lab [mkj]   Vital Signs:  Patient profile:   45 year old female Height:      66.75 inches Weight:      182.2 pounds BMI:     28.85 Temp:     98.1 degrees F oral Pulse rate:   91 / minute BP sitting:   152 / 95  (right arm)  Vitals Entered By: Filomena Jungling NT II (August 01, 2009 10:00 AM) CC: blood pressure and labs Is Patient Diabetic? No Pain Assessment Patient in pain? no      Nutritional Status BMI of 25 - 29 = overweight  Does patient need assistance? Functional Status Self care Ambulation Normal   CC:  blood pressure and labs.  History of Present Illness: This is a  year old woman with past medical history outlined in chart.  She is here for bp recheck and labs, has no new complaints.  Preventive Screening-Counseling & Management  Alcohol-Tobacco     Smoking Status: current     Smoking Cessation Counseling: yes     Packs/Day: 1/2     Year Started: AT THE AGE OF 16     Passive Smoke Exposure: yes  Caffeine-Diet-Exercise     Does Patient Exercise: no  Current Medications (verified): 1)  Multivitamins   Tabs (Multiple Vitamin) .... Take 1 Tablet By Mouth Once A Day 2)  Calcium 600/vitamin D 600-200 Mg-Unit  Tabs (Calcium Carbonate-Vitamin D) .... Take 1 Tablet By Mouth Two Times A Day 3)  Pravastatin Sodium 20 Mg Tabs (Pravastatin Sodium) .... Take 1 Tablet By Mouth At Bedtime For Your Cholesterol 4)  Lisinopril 20 Mg Tabs (Lisinopril) .... Take One Tablet Daily For Blood Pressure.  Allergies (verified): 1)  ! Pcn 2)  ! * Bananas  Review of Systems       per hpi  Physical Exam  General:  alert and well-developed.   Head:  normocephalic and atraumatic.   Eyes:  vision grossly intact and pupils equal.   Mouth:  pharynx pink and moist.   Lungs:  normal respiratory effort and normal breath sounds.   Heart:  normal rate, regular rhythm, and no murmur.   Pulses:  2+ Extremities:  no edema Neurologic:  alert & oriented X3,  cranial nerves II-XII intact, strength normal in all extremities, and gait normal.   Psych:  Oriented X3, memory intact for recent and remote, and normally interactive.     Impression & Recommendations:  Problem # 1:  HYPERLIPIDEMIA (ICD-272.4) check labs today.  Her updated medication list for this problem includes:    Pravastatin Sodium 20 Mg Tabs (Pravastatin sodium) .Marland Kitchen... Take 1 tablet by mouth at bedtime for your cholesterol  Orders: T-Lipid Profile ([id number])  Problem # 2:  TOBACCO ABUSE (ICD-305.1) still smoking, not ready to stop.  Problem # 3:  HYPERTENSION (ICD-401.9) initially 152/95, 130/80 on recheck. no changes.  Her updated medication list for this problem includes:    Lisinopril 20 Mg Tabs (Lisinopril) .Marland Kitchen... Take one tablet daily for blood pressure.  BP today: 152/95 recheck 130/80.  Prior BP: 142/94 (06/26/2009)  Labs Reviewed: K+: 4.4 (07/23/2008) Creat: : 0.60 (07/23/2008)   Chol: 180 (05/28/2008)   HDL: 38 (05/28/2008)   LDL: 128 (05/28/2008)   TG: 72 (05/28/2008)  Problem # 4:  SHOULDER PAIN, RIGHT (ICD-719.41) chronic, she is managing this on her own.  does not wish for further work up or treatment.  Complete Medication List: 1)  Multivitamins Tabs (  Multiple vitamin) .... Take 1 tablet by mouth once a day 2)  Calcium 600/vitamin D 600-200 Mg-unit Tabs (Calcium carbonate-vitamin d) .... Take 1 tablet by mouth two times a day 3)  Pravastatin Sodium 20 Mg Tabs (Pravastatin sodium) .... Take 1 tablet by mouth at bedtime for your cholesterol 4)  Lisinopril 20 Mg Tabs (Lisinopril) .... Take one tablet daily for blood pressure.  Other Orders: T-Comprehensive Metabolic Panel [id number])  Patient Instructions: 1)  You had lab work done today we will call you if there is anything that needs to be addressed. 2)  Please read the article about low salt diet. 3)  Please schedule a follow-up appointment in 3 months.  Prevention & Chronic  Care Immunizations   Influenza vaccine: refuses  (03/07/2008)   Influenza vaccine deferral: Refused  (08/01/2009)    Tetanus booster: Not documented    Pneumococcal vaccine: Not documented  Other Screening   Pap smear: Specimen Adequacy: Satisfactory for evaluation.   Interpretation/Result:Negative for intraepithelial Lesion or Malignancy.   Location: [facility].    (08/08/2008)   Pap smear due: 08/2009    Mammogram: No specific mammographic evidence of malignancy.  Assessment: BIRADS 1.   (05/14/2008)   Mammogram action/deferral: Ordered  (08/01/2009)   Mammogram due: 06/04/2010   Smoking status: current  (08/01/2009)   Smoking cessation counseling: yes  (08/01/2009)  Lipids   Total Cholesterol: 180  (05/28/2008)   Lipid panel action/deferral: Lipid Panel ordered   LDL: 128  (05/28/2008)   LDL Direct: Not documented   HDL: 38  (05/28/2008)   Triglycerides: 72  (05/28/2008)    SGOT (AST): 18  (07/23/2008)   BMP action: Ordered   SGPT (ALT): 24  (07/23/2008) CMP ordered    Alkaline phosphatase: 46  (07/23/2008)   Total bilirubin: 0.3  (07/23/2008)    Lipid flowsheet reviewed?: Yes   Progress toward LDL goal: Unchanged  Hypertension   Last Blood Pressure: 152 / 95  (08/01/2009)   Serum creatinine: 0.60  (07/23/2008)   BMP action: Ordered   Serum potassium 4.4  (07/23/2008) CMP ordered     Hypertension flowsheet reviewed?: Yes   Progress toward BP goal: Deteriorated  Self-Management Support :    Patient will work on the following items until the next clinic visit to reach self-care goals:     Medications and monitoring: take my medicines every day, check my blood pressure, bring all of my medications to every visit  (08/01/2009)     Eating: drink diet soda or water instead of juice or soda, eat more vegetables, use fresh or frozen vegetables, eat foods that are low in salt, eat baked foods instead of fried foods, eat fruit for snacks and desserts   (08/01/2009)     Activity: take a 30 minute walk every day  (08/01/2009)    Hypertension self-management support: Written self-care plan  (03/03/2009)    Lipid self-management support: Not documented    Nursing Instructions: Schedule screening mammogram (see order)    Process Orders Check Orders Results:     [facility]: ABN not required for this insurance Tests Sent for requisitioning (August 01, 2009 2:06 PM):     08/01/2009: [facility] -- T-Lipid Profile [phone] (signed)     08/01/2009: [facility] -- T-Comprehensive Metabolic Panel [phone] (signed)

## 2010-07-16 NOTE — Progress Notes (Signed)
Summary: refill/ hla  Phone Note Refill Request Message from:  Patient on June 17, 2010 5:26 PM  Refills Requested: Medication #1:  PRAVASTATIN SODIUM 20 MG TABS Take 1 tablet by mouth at bedtime for your cholesterol   Dosage confirmed as above?Dosage Confirmed   Last Refilled: 05/13/2010 Last office visit and labs was 11/20/2009.  Initial call taken by: Marin Roberts RN,  June 17, 2010 5:26 PM  Follow-up for Phone Call        Has Feb appt. Will refill. Follow-up by: Blanch Media MD,  June 18, 2010 10:07 AM    Prescriptions: PRAVASTATIN SODIUM 20 MG TABS (PRAVASTATIN SODIUM) Take 1 tablet by mouth at bedtime for your cholesterol  #30 x 1   Entered and Authorized by:   Blanch Media MD   Signed by:   Blanch Media MD on 06/18/2010   Method used:   Electronically to        [facility] [phone]* (retail)       [address]       [place], [place]  [address]       Ph: [phone]       Fax: [phone]   RxID:   [id number]

## 2010-07-16 NOTE — Progress Notes (Signed)
Summary: PAP result information  Phone Note Outgoing Call   Summary of Call: PAP new results found per [facility] August 4,2011 @ [facility]  Tracey Fulcher  July 07, 2010 11:54 AM

## 2010-07-22 ENCOUNTER — Ambulatory Visit (INDEPENDENT_AMBULATORY_CARE_PROVIDER_SITE_OTHER): Payer: PRIVATE HEALTH INSURANCE | Admitting: Internal Medicine

## 2010-07-22 ENCOUNTER — Encounter: Payer: Self-pay | Admitting: Internal Medicine

## 2010-07-22 VITALS — BP 133/86 | HR 88 | Temp 97.4°F | Ht 66.0 in | Wt 178.5 lb

## 2010-07-22 DIAGNOSIS — R51 Headache: Secondary | ICD-10-CM

## 2010-07-22 DIAGNOSIS — E785 Hyperlipidemia, unspecified: Secondary | ICD-10-CM

## 2010-07-22 DIAGNOSIS — N920 Excessive and frequent menstruation with regular cycle: Secondary | ICD-10-CM

## 2010-07-22 DIAGNOSIS — I10 Essential (primary) hypertension: Secondary | ICD-10-CM

## 2010-07-22 LAB — CBC
MCH: 29.2 pg (ref 26.0–34.0)
RDW: 13.8 % (ref 11.5–15.5)

## 2010-07-22 MED ORDER — PRAVASTATIN SODIUM 20 MG PO TABS: 20.0000 mg | ORAL_TABLET | Freq: Every day | ORAL | Status: DC

## 2010-07-22 MED ORDER — LISINOPRIL 20 MG PO TABS: 20.0000 mg | ORAL_TABLET | Freq: Every day | ORAL | Status: DC

## 2010-07-22 NOTE — Assessment & Plan Note (Signed)
Pt was not fasting, so will check FLP at next visit. Her last lipid panel was normal. Will refill her pravastatin for now.

## 2010-07-22 NOTE — Patient Instructions (Addendum)
Please make a follow up appointment in 3 months. If your bleeding gets worse next month, please follow up with your [facility] GYN doctor. Make an early appointment if felt needed.

## 2010-07-22 NOTE — Assessment & Plan Note (Signed)
No new complaint today.

## 2010-07-22 NOTE — Progress Notes (Signed)
  Subjective:    Patient ID: Lauren [name], female    DOB: May 20, 1966, 45 y.o.   MRN: [id number]  HPI Pt is a 45 yo woman with PMH of Menorrhagia, Hyperlipidemia, who comes to the clinic for a f/u appointment after she was seen at [facility] last month for her heavy menstrual periods. She has menorrhagia for about 6-7 months now. She started her last periods on 2/2 and they were very heavy and lasted for about 4 days. She had to change about 3-4 pads a day and was constantly bleeding. She also has crampy abdominal pain along with that. She had 5-6 days of bleeding last month when she went to [facility] hospital- where she was extensively worked up- per her- and had normal tests. She also feels more fatigue than before, but is not more sleepy.  Review of Systems  Constitutional: Positive for fatigue. Negative for fever, chills, diaphoresis, activity change, appetite change and unexpected weight change.  HENT: Negative.   Respiratory: Negative.   Cardiovascular: Negative.   Gastrointestinal: Negative.   Musculoskeletal: Negative.   Neurological: Negative.   Hematological: Negative.   Psychiatric/Behavioral: Negative.        Objective:   Physical Exam  Constitutional: She is oriented to person, place, and time. She appears well-developed and well-nourished.  HENT:  Head: Normocephalic and atraumatic.  Right Ear: External ear normal.  Left Ear: External ear normal.  Nose: Nose normal.  Mouth/Throat: Oropharynx is clear and moist.  Eyes: Conjunctivae and EOM are normal. Pupils are equal, round, and reactive to light. No scleral icterus.  Neck: Normal range of motion. Neck supple. No JVD present. No thyromegaly present.  Cardiovascular: Normal rate, regular rhythm, normal heart sounds and intact distal pulses.  Exam reveals no gallop and no friction rub.   No murmur heard. Pulmonary/Chest: Effort normal and breath sounds normal. No respiratory distress. She has no wheezes. She has  no rales.  Abdominal: Soft. Bowel sounds are normal.  Musculoskeletal: Normal range of motion. She exhibits no edema and no tenderness.  Neurological: She is alert and oriented to person, place, and time. She has normal reflexes.  Skin: Skin is warm and dry. No rash noted. No erythema.  Psychiatric: She has a normal mood and affect. Her behavior is normal.          Assessment & Plan:

## 2010-07-22 NOTE — Assessment & Plan Note (Signed)
BP well controlled with Lisinopril- will refill it and will not make any new changes.

## 2010-07-22 NOTE — Assessment & Plan Note (Signed)
Pt was followed up at [facility] last month for her heavy periods and so advised her to follow up with her OB GYN doctor for her Menorrhagia, if gets worse during next menstruation.  She had trans abdominal and vaginal U/S done- which showed early fibroid changes. Also she had pap smear and other diagnostic procedures done at [facility] hospital, which all were normal per pt. So she might be peri-menopausal considering her normal tests and her age.  She was offered Depo- provera shots by her OB GYN doc, but she is not willing to start it again. So will wait for nest period for now and see how she does at that time. Will check CBC for Hb as she is feeling more tired than usual.

## 2010-07-23 NOTE — Progress Notes (Signed)
I have reviewed and discussed the care of this patient in detail with the resident physician including pertinent patient records, physical exam findings and data. I agree with details of this encounter.   

## 2010-08-22 ENCOUNTER — Other Ambulatory Visit: Payer: Self-pay | Admitting: Internal Medicine

## 2010-09-04 ENCOUNTER — Inpatient Hospital Stay (INDEPENDENT_AMBULATORY_CARE_PROVIDER_SITE_OTHER)
Admission: RE | Admit: 2010-09-04 | Discharge: 2010-09-04 | Disposition: A | Discharge status: Home / Self Care | Payer: PRIVATE HEALTH INSURANCE | Source: Ambulatory Visit | Attending: Emergency Medicine | Admitting: Emergency Medicine

## 2010-09-04 ENCOUNTER — Ambulatory Visit (INDEPENDENT_AMBULATORY_CARE_PROVIDER_SITE_OTHER): Payer: PRIVATE HEALTH INSURANCE

## 2010-09-04 DIAGNOSIS — J039 Acute tonsillitis, unspecified: Secondary | ICD-10-CM

## 2010-09-04 LAB — POCT RAPID STREP A (OFFICE): Streptococcus, Group A Screen (Direct): NEGATIVE

## 2010-09-19 LAB — COMPREHENSIVE METABOLIC PANEL
ALT: 18 U/L (ref 0–35)
AST: 24 U/L (ref 0–37)
Alkaline Phosphatase: 48 U/L (ref 39–117)
BUN: 10 mg/dL (ref 6–23)
Calcium: 9.1 mg/dL (ref 8.4–10.5)
Chloride: 100 mEq/L (ref 96–112)
Creatinine, Ser: 0.62 mg/dL (ref 0.4–1.2)
Glucose, Bld: 112 mg/dL — ABNORMAL HIGH (ref 70–99)
Potassium: 3.4 mEq/L — ABNORMAL LOW (ref 3.5–5.1)
Sodium: 135 mEq/L (ref 135–145)
Total Protein: 7.7 g/dL (ref 6.0–8.3)

## 2010-09-19 LAB — URINALYSIS, ROUTINE W REFLEX MICROSCOPIC
Glucose, UA: NEGATIVE mg/dL
Specific Gravity, Urine: 1.011 (ref 1.005–1.030)
Urobilinogen, UA: 0.2 mg/dL (ref 0.0–1.0)
pH: 6 (ref 5.0–8.0)

## 2010-09-19 LAB — URINE MICROSCOPIC-ADD ON

## 2010-09-19 LAB — DIFFERENTIAL
Basophils Absolute: 0.1 10*3/uL (ref 0.0–0.1)
Eosinophils Absolute: 0.3 10*3/uL (ref 0.0–0.7)

## 2010-09-19 LAB — CBC
HCT: 36.7 % (ref 36.0–46.0)
Hemoglobin: 12.7 g/dL (ref 12.0–15.0)
RBC: 3.98 MIL/uL (ref 3.87–5.11)

## 2010-09-19 LAB — PROTIME-INR
INR: 0.9 (ref 0.00–1.49)
Prothrombin Time: 12.5 seconds (ref 11.6–15.2)

## 2010-09-19 LAB — POCT PREGNANCY, URINE: Preg Test, Ur: NEGATIVE

## 2010-10-27 NOTE — Group Therapy Note (Signed)
NAME:  JANEQUA, KIPNIS NO.:  [id number]   MEDICAL RECORD NO.:  [id number]          PATIENT TYPE:  [facility]   LOCATION:  [facility]                   FACILITY:  [facility]   PHYSICIAN:  Argentina Donovan, MD        DATE OF BIRTH:  1965/08/31   DATE OF SERVICE:  07/12/2007                                  CLINIC NOTE   The patient is a 45 year old African American female who has been four  years on Depo-Provera.  She is a gravida 3, para 1-0-2-1 who is on Depo-  Provera for 4 years. She had one episode of irregular bleeding several  months ago but has had no bleeding since that time. Was sent in by her  family practice doctor with a question whether she should could continue  on it.  She desires to continue if she can. She has been taking calcium  600 mg a day. I have told her to increase that to 1200 and the next time  she gets a mammogram to get a bone density scan that should be all she  should need.  I see no harm in her continuing on it.  However, I have  told her that irregular bleeding at her age could not be acceptable and  if she has more of that irregular starting again that she should come  and get an endometrial biopsy. Seems to understand that well.   IMPRESSION:  A 45 year old lady on Depo-Provera without problems.           ______________________________  Argentina Donovan, MD     PR/MEDQ  D:  07/12/2007  T:  07/13/2007  Job:  [id number]

## 2010-11-08 ENCOUNTER — Encounter: Payer: Self-pay | Admitting: Internal Medicine

## 2010-12-15 ENCOUNTER — Encounter: Payer: Self-pay | Admitting: Internal Medicine

## 2010-12-15 ENCOUNTER — Ambulatory Visit (INDEPENDENT_AMBULATORY_CARE_PROVIDER_SITE_OTHER): Payer: PRIVATE HEALTH INSURANCE | Admitting: Internal Medicine

## 2010-12-15 DIAGNOSIS — L0292 Furuncle, unspecified: Secondary | ICD-10-CM

## 2010-12-15 DIAGNOSIS — L02439 Carbuncle of limb, unspecified: Secondary | ICD-10-CM

## 2010-12-15 DIAGNOSIS — L02429 Furuncle of limb, unspecified: Secondary | ICD-10-CM | POA: Insufficient documentation

## 2010-12-15 DIAGNOSIS — I10 Essential (primary) hypertension: Secondary | ICD-10-CM

## 2010-12-15 HISTORY — DX: Furuncle, unspecified: L02.92

## 2010-12-15 MED ORDER — DOXYCYCLINE HYCLATE 100 MG PO TABS: 100.0000 mg | ORAL_TABLET | Freq: Two times a day (BID) | ORAL | Status: AC

## 2010-12-15 NOTE — Patient Instructions (Signed)
Please take Doxycycline 100mg  one tablet twice daily with meals x 2 weeks Follow up with Dr. Anselm Jungling in 2 weeks

## 2010-12-15 NOTE — Assessment & Plan Note (Signed)
Slightly elevated.  Likely 2/2 hot weather.  She reports medication compliance with her Lisinopril. -Will continue current med for now.

## 2010-12-15 NOTE — Progress Notes (Signed)
HPI: 45 yo woman presents with a boils under her right axilla x 2 weeks in duration.  She states that they are painful, erythematous but denies any fever, chills, n/v, or any other systemic symptoms.  She has never had similar symptoms in the past.  No other complaints today.  She is compliant with her medications.  She states that it is time for her mammogram and she will schedule an appointment soon.  ROS:  Negative for fever, chills, n/v, SOB, chestpain  PE: General: alert, well-developed, and cooperative to examination.  Lungs: normal respiratory effort, no accessory muscle use, normal breath sounds, no crackles, and no wheezes. Heart: normal rate, regular rhythm, no murmur, no gallop, and no rub.  Abdomen: soft, non-tender, normal bowel sounds, no distention, no guarding, no rebound tenderness, no hepatomegaly, and no splenomegaly.  Extremities: No cyanosis, clubbing, edema, Right axilla: multiple indurated skin lesion that is erythematous, tender, with purulent drainage in various sizes- 1-2cm.  Neurologic: alert & oriented X3, cranial nerves II-XII intact, strength normal in all extremities, sensation intact to light touch, and gait normal.

## 2010-12-15 NOTE — Assessment & Plan Note (Addendum)
Multiple boils under right axilla (5 total).  I manually drained one of the boils on medial aspect with purulent and bloody contents.  Will treat with Doxycycline 100mg  po bid with meals x 2 weeks. Will recheck in 2 weeks, if not resolved, may need to I&D.

## 2010-12-29 ENCOUNTER — Ambulatory Visit: Payer: PRIVATE HEALTH INSURANCE | Admitting: Internal Medicine

## 2010-12-31 ENCOUNTER — Encounter: Payer: PRIVATE HEALTH INSURANCE | Admitting: Internal Medicine

## 2011-08-31 ENCOUNTER — Other Ambulatory Visit: Payer: Self-pay | Admitting: Internal Medicine

## 2011-09-14 ENCOUNTER — Telehealth: Payer: Self-pay | Admitting: *Deleted

## 2011-09-14 NOTE — Telephone Encounter (Signed)
Call from pt. C/o headache; ears stopped up esp on the right side; started w/"tickle" in her throat yesterday. Not feeling any better.  No fever. Appt given for Thursday at 1:45PM.

## 2011-09-14 NOTE — Telephone Encounter (Signed)
Agree with the plan of giving patient a future appointment to reassess her clinically.

## 2011-09-16 ENCOUNTER — Ambulatory Visit (INDEPENDENT_AMBULATORY_CARE_PROVIDER_SITE_OTHER): Payer: PRIVATE HEALTH INSURANCE | Admitting: Internal Medicine

## 2011-09-16 ENCOUNTER — Encounter: Payer: Self-pay | Admitting: Internal Medicine

## 2011-09-16 VITALS — BP 144/92 | HR 98 | Temp 98.2°F | Ht 66.0 in | Wt 182.6 lb

## 2011-09-16 DIAGNOSIS — J019 Acute sinusitis, unspecified: Secondary | ICD-10-CM

## 2011-09-16 MED ORDER — SODIUM CHLORIDE 0.65 % NA SOLN: 1.0000 | NASAL | Status: DC | PRN

## 2011-09-16 MED ORDER — CYCLOBENZAPRINE HCL 5 MG PO TABS: 5.0000 mg | ORAL_TABLET | Freq: Three times a day (TID) | ORAL | Status: AC | PRN

## 2011-09-16 MED ORDER — PSEUDOEPHEDRINE-CODEINE-GG 30-10-100 MG/5ML PO SOLN: 10.0000 mL | Freq: Four times a day (QID) | ORAL | Status: AC | PRN

## 2011-09-16 NOTE — Progress Notes (Signed)
Patient ID: Lauren [name], female   DOB: 02/17/1966, 46 y.o.   MRN: [id number] 46 year old woman with past medical history listed below comes for a clinic visit Complaining of ear discomfort bilaterally, sore throat, runny nose, dry cough, fatigue for about 3 days Also complaining of left back/ flank pain on movement for past 2 days. Does not remember how it started. Hurts when she presses on that area. Never happened to her before.  Physical exam   General Appearance:     Filed Vitals:   09/16/11 1332  BP: 144/92  Pulse: 98  Temp: 98.2 F (36.8 C)  TempSrc: Oral  Height: 5\' 6"  (1.676 m)  Weight: 182 lb 9.6 oz (82.827 kg)     Alert, cooperative, no distress, appears stated age  Head:    Normocephalic, without obvious abnormality, atraumatic  Eyes:    PERRL, conjunctiva/corneas clear, EOM's intact, fundi    benign, both eyes       Neck:   Supple, symmetrical, trachea midline, no adenopathy;       thyroid:  No enlargement/tenderness/nodules; no carotid   bruit or JVD  Back    pain and muscle spasm on the left back , pain with movement   Lungs:     Clear to auscultation bilaterally, respirations unlabored  Chest wall:    No tenderness or deformity  Heart:    Regular rate and rhythm, S1 and S2 normal, no murmur, rub   or gallop  Abdomen:     Soft, non-tender, bowel sounds active all four quadrants,    no masses, no organomegaly  Extremities:   Extremities normal, atraumatic, no cyanosis or edema  Pulses:   2+ and symmetric all extremities  Skin:   Skin color, texture, turgor normal, no rashes or lesions  Neurologic:  nonfocal grossly   Review of system- as per history of present illness

## 2011-09-16 NOTE — Assessment & Plan Note (Signed)
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

## 2011-09-16 NOTE — Patient Instructions (Signed)

## 2011-09-20 ENCOUNTER — Encounter: Payer: PRIVATE HEALTH INSURANCE | Admitting: Internal Medicine

## 2011-11-03 ENCOUNTER — Other Ambulatory Visit: Payer: Self-pay | Admitting: *Deleted

## 2011-11-03 MED ORDER — PRAVASTATIN SODIUM 20 MG PO TABS: 20.0000 mg | ORAL_TABLET | Freq: Every day | ORAL | Status: DC

## 2011-12-22 ENCOUNTER — Encounter: Payer: Self-pay | Admitting: Internal Medicine

## 2011-12-22 ENCOUNTER — Ambulatory Visit (INDEPENDENT_AMBULATORY_CARE_PROVIDER_SITE_OTHER): Payer: PRIVATE HEALTH INSURANCE | Admitting: Internal Medicine

## 2011-12-22 ENCOUNTER — Ambulatory Visit (HOSPITAL_COMMUNITY)
Admission: RE | Admit: 2011-12-22 | Discharge: 2011-12-22 | Disposition: A | Discharge status: Home / Self Care | Payer: PRIVATE HEALTH INSURANCE | Source: Ambulatory Visit | Attending: Internal Medicine | Admitting: Internal Medicine

## 2011-12-22 VITALS — BP 156/99 | HR 90 | Temp 98.6°F | Ht 66.0 in | Wt 178.8 lb

## 2011-12-22 DIAGNOSIS — I10 Essential (primary) hypertension: Secondary | ICD-10-CM

## 2011-12-22 DIAGNOSIS — M25559 Pain in unspecified hip: Secondary | ICD-10-CM | POA: Insufficient documentation

## 2011-12-22 DIAGNOSIS — R51 Headache: Secondary | ICD-10-CM

## 2011-12-22 DIAGNOSIS — G8929 Other chronic pain: Secondary | ICD-10-CM

## 2011-12-22 DIAGNOSIS — M25551 Pain in right hip: Secondary | ICD-10-CM

## 2011-12-22 MED ORDER — ATENOLOL 100 MG PO TABS: 100.0000 mg | ORAL_TABLET | Freq: Every day | ORAL | Status: DC

## 2011-12-22 NOTE — Patient Instructions (Signed)
Make followup appointment in 6-8 weeks- earlier if needed. Please get the x-ray done for her right hip today. I will call you if the results are abnormal. Also start taking atenolol 100 mg once daily for her blood pressure and headaches. Keep taking all the other medications regularly.

## 2011-12-22 NOTE — Assessment & Plan Note (Signed)
Unclear etiology. Patient works at [employer]- in [place]. Has to stand 12 hours a day when she works. She's been working there for 10 years Unclear if patient has any arthritis- but wear and tear of joint along with muscle strain in differential. Discussed with her about this and will get right hip x-ray today to rule out arthritis/fracture. Meanwhile advised her to continue taking when necessary pain medications. -Discussed about physical therapy- the patient says that is not covered by insurance and she cannot afford it. Will evaluate her soon.

## 2011-12-22 NOTE — Assessment & Plan Note (Signed)
Lab Results  Component Value Date   NA 141 08/01/2009   K 4.3 08/01/2009   CL 106 08/01/2009   CO2 26 08/01/2009   BUN 13 08/01/2009   CREATININE 0.69 08/01/2009    BP Readings from Last 3 Encounters:  12/22/11 156/99  09/16/11 144/92  12/15/10 145/91    Assessment: Hypertension control:  mildly elevated  Progress toward goals:  deteriorated Barriers to meeting goals:  no barriers identified  Plan: Hypertension treatment:  We'll continue lisinopril and add atenolol 100 mg daily- heart rate 90s and also patient has chronic migraine like symptoms.

## 2011-12-22 NOTE — Assessment & Plan Note (Signed)
Chronic headache which resolved and now came back for last few months. History supports migraine-like headaches which gets better with Excedrin but not with Tylenol, ibuprofen or Aleve.   Patient has never tried sumatriptan or other abortive medications. Although her insurance doesn't cover for any prescriptions. Discussed with her in detail about acute and chronic management of migraine headaches. We'll start her on atenolol 100 mg daily for her mildly elevated blood pressure and migraine prophylaxis. Discussed with her that she has to wait for few weeks to see the results if any.  She verbalized understanding. Will evaluate in 6-8 weeks.

## 2011-12-22 NOTE — Progress Notes (Signed)
  Subjective:    Patient ID: Lauren [name], female    DOB: 1966/03/09, 46 y.o.   MRN: [id number]  HPI patient is a pleasant 50 year woman with past history of menorrhagia, hypertension, bilateral knee pain who comes the clinic with chief complaint of headache and right hip pain for past few months.  Headaches : She complains of migraine-like headache symptoms everyday for past few months which are intermittently associated with nausea but no vision changes, vomiting, weakness, neurological changes.  Right hip pain: Almost every day- has about 2-3 pain-free days a month. Usually starts with long-standing at work. Exacerbated by twisting or movements of right hip. Resting helps, although she also has hip pain a few days when she wakes up in morning. No radiation to the leg. Also has intermittent left hip pain which is not as frequent as right.  Also complains of enlarging right knee skin lesion. Present for many years-recently enlarging. Does not leg melanoma. Light cherry-colored Cystic lesion.  Denies any fever, chills, vomiting, abdominal pain, diarrhea, palpitations, chest pain, short of breath.  Review of Systems    As per history of present illness, all other systems reviewed and negative. Objective:   Physical Exam  General: NAD HEENT: PERRL, EOMI, no scleral icterus Cardiac: RRR, no rubs, murmurs or gallops Pulm: clear to auscultation bilaterally, moving normal volumes of air Abd: soft, nontender, nondistended, BS present Musculoskeletal: No tenderness to palpation of entire spine. Mild paraspinal tenderness to deep palpation right side- especially buttock area. Ext: warm and well perfused, no pedal edema. Right knee 0.5 x 0.5 cm cystic lesion. Nontender. Neuro: alert and oriented X3, cranial nerves II-XII grossly intact        Assessment & Plan:

## 2011-12-31 ENCOUNTER — Telehealth: Payer: Self-pay | Admitting: *Deleted

## 2011-12-31 NOTE — Telephone Encounter (Signed)
Pt states since starting atenolol he heart "has started fluttering", i am making her an appt for next week, anything else that you would like to do?

## 2011-12-31 NOTE — Telephone Encounter (Signed)
She should be seen soon and if any dizziness or LOC or SOB go to ED.

## 2011-12-31 NOTE — Telephone Encounter (Signed)
OK 

## 2012-01-05 ENCOUNTER — Encounter: Payer: PRIVATE HEALTH INSURANCE | Admitting: Internal Medicine

## 2012-03-14 ENCOUNTER — Other Ambulatory Visit: Payer: Self-pay | Admitting: *Deleted

## 2012-03-14 DIAGNOSIS — I10 Essential (primary) hypertension: Secondary | ICD-10-CM

## 2012-03-14 MED ORDER — ATENOLOL 100 MG PO TABS: 100.0000 mg | ORAL_TABLET | Freq: Every day | ORAL | Status: DC

## 2012-03-15 ENCOUNTER — Encounter: Payer: PRIVATE HEALTH INSURANCE | Admitting: Internal Medicine

## 2012-03-22 ENCOUNTER — Ambulatory Visit (INDEPENDENT_AMBULATORY_CARE_PROVIDER_SITE_OTHER): Payer: PRIVATE HEALTH INSURANCE | Admitting: Internal Medicine

## 2012-03-22 ENCOUNTER — Encounter: Payer: Self-pay | Admitting: Internal Medicine

## 2012-03-22 ENCOUNTER — Ambulatory Visit (HOSPITAL_COMMUNITY)
Admission: RE | Admit: 2012-03-22 | Discharge: 2012-03-22 | Disposition: A | Discharge status: Home / Self Care | Payer: PRIVATE HEALTH INSURANCE | Source: Ambulatory Visit | Attending: Internal Medicine | Admitting: Internal Medicine

## 2012-03-22 VITALS — BP 160/93 | HR 66 | Temp 98.3°F | Ht 66.0 in | Wt 179.9 lb

## 2012-03-22 DIAGNOSIS — R51 Headache: Secondary | ICD-10-CM

## 2012-03-22 DIAGNOSIS — M25559 Pain in unspecified hip: Secondary | ICD-10-CM

## 2012-03-22 DIAGNOSIS — R9431 Abnormal electrocardiogram [ECG] [EKG]: Secondary | ICD-10-CM | POA: Insufficient documentation

## 2012-03-22 DIAGNOSIS — I10 Essential (primary) hypertension: Secondary | ICD-10-CM

## 2012-03-22 DIAGNOSIS — I4902 Ventricular flutter: Secondary | ICD-10-CM | POA: Insufficient documentation

## 2012-03-22 DIAGNOSIS — R002 Palpitations: Secondary | ICD-10-CM | POA: Insufficient documentation

## 2012-03-22 DIAGNOSIS — M25551 Pain in right hip: Secondary | ICD-10-CM

## 2012-03-22 MED ORDER — LISINOPRIL 40 MG PO TABS: 40.0000 mg | ORAL_TABLET | Freq: Every day | ORAL | Status: DC

## 2012-03-22 MED ORDER — ATENOLOL 100 MG PO TABS: 100.0000 mg | ORAL_TABLET | Freq: Every day | ORAL | Status: DC

## 2012-03-22 NOTE — Assessment & Plan Note (Signed)
Patient reports heart palpitations or flutters sensations after starting atenolol. Unclear etiology.  Discussed with Dr. Eben Burow in detail. Patient does not have any history of A. fib or other rhythm abnormalities. She is a smoker- advised her to quit smoking. Will get 12-lead EKG today- which shows normal sinus rhythm, with possible left atrial enlargement per P wave in V1 but no other significant new changes. She might need further evaluation with event monitor, 2-D echo if she continues to have issues.

## 2012-03-22 NOTE — Assessment & Plan Note (Addendum)
Lab Results  Component Value Date   NA 141 08/01/2009   K 4.3 08/01/2009   CL 106 08/01/2009   CO2 26 08/01/2009   BUN 13 08/01/2009   CREATININE 0.69 08/01/2009    BP Readings from Last 3 Encounters:  03/22/12 160/93  12/22/11 156/99  09/16/11 144/92    Assessment: Hypertension control:  mildly elevated  Progress toward goals:  deteriorated Barriers to meeting goals:  no barriers identified  Plan: Hypertension treatment:  Continue atenolol at 100 mg daily and increase lisinopril to 40 mg daily. Check CMP today before increasing dose of lisinopril.

## 2012-03-22 NOTE — Assessment & Plan Note (Signed)
Unclear etiology. Differentials include referred pain from knee joint/back, intrinsic hip pathology ( ischemic necrosis- smoking), osteoarthritis. X-ray negative for any changes in July 2013. Will refer to sports medicine for further help in management and evaluation. No ROM abnormalities observed today.

## 2012-03-22 NOTE — Patient Instructions (Signed)
Please make a followup appointment in 4-6 weeks. Continue taking atenolol 100 mg daily.  increase the dose of lisinopril to 40 mg daily. Take 2 pills of 20 mg until your done with the pill box you have now and then get the new prescription refill. If you have further episodes of flutter, call us and make an early appointment or go to the ED if you have any chest pain, dizziness along with that. Also followup with sports medicine clinic for the hip pain.

## 2012-03-22 NOTE — Assessment & Plan Note (Signed)
Headaches improved post prophylaxis with atenolol. Continued as needed Excedrin and daily atenolol.

## 2012-03-22 NOTE — Progress Notes (Signed)
  Subjective:    Patient ID: Lauren [name], female    DOB: May 05, 1966, 46 y.o.   MRN: [id number]  HPI patient's pleasant 46 year old woman with past history of hypertension, lipidemia, migraine headaches who comes the clinic for followup visit.  Right hip pain- she still has this same right hip pain- which mostly stays in that area- and intermittently radiates to her outer thigh or sometimes to lower back. Sometimes also has left hip pain. X-ray done on 12/22/2011 was negative for any acute or chronic changes.  Headaches: Improved with atenolol. She still uses Excedrin which helps her pain. Now has 1-2 episodes per week which is better from everyday headaches.  Also she complains of heart flutter sensation since she started atenolol. She had about 4-5 spells- not associated with chest pain or dizziness. But does complain of mild short of breath with the episodes. Did not pass out.  She denies any fever, chills, nausea, vomiting, abdominal pain, diarrhea.    Review of Systems    as per history of present illness, all other systems reviewed and negative. Objective:   Physical Exam  General: NAD HEENT: PERRL, EOMI, no scleral icterus Cardiac: S1, S2, RRR, no rubs, murmurs or gallops Pulm: clear to auscultation bilaterally, moving normal volumes of air Abd: soft, nontender, nondistended, BS present Ext: warm and well perfused, no pedal edema Neuro: alert and oriented X3, cranial nerves II-XII grossly intact       Assessment & Plan:

## 2012-03-23 LAB — COMPLETE METABOLIC PANEL WITH GFR
ALT: 13 U/L (ref 0–35)
AST: 15 U/L (ref 0–37)
Creat: 0.62 mg/dL (ref 0.50–1.10)
Total Bilirubin: 0.3 mg/dL (ref 0.3–1.2)

## 2012-03-24 NOTE — Progress Notes (Signed)
Pt called and asked for her atenolol Rx be changed to # 30 a month instead of # 90 a month.   Change has been made with [facility].

## 2012-04-17 NOTE — Addendum Note (Signed)
Addended by: Neomia Dear on: 04/17/2012 07:03 PM   Modules accepted: Orders

## 2012-05-19 ENCOUNTER — Other Ambulatory Visit: Payer: Self-pay | Admitting: *Deleted

## 2012-05-19 MED ORDER — PRAVASTATIN SODIUM 20 MG PO TABS: 20.0000 mg | ORAL_TABLET | Freq: Every day | ORAL | Status: DC

## 2012-07-12 ENCOUNTER — Encounter: Payer: PRIVATE HEALTH INSURANCE | Admitting: Internal Medicine

## 2012-08-30 ENCOUNTER — Ambulatory Visit (INDEPENDENT_AMBULATORY_CARE_PROVIDER_SITE_OTHER): Payer: BC Managed Care – PPO | Admitting: Internal Medicine

## 2012-08-30 ENCOUNTER — Encounter: Payer: Self-pay | Admitting: Internal Medicine

## 2012-08-30 VITALS — BP 140/93 | HR 75 | Temp 98.4°F | Ht 66.0 in | Wt 185.9 lb

## 2012-08-30 DIAGNOSIS — Z1231 Encounter for screening mammogram for malignant neoplasm of breast: Secondary | ICD-10-CM

## 2012-08-30 DIAGNOSIS — I10 Essential (primary) hypertension: Secondary | ICD-10-CM

## 2012-08-30 DIAGNOSIS — R51 Headache: Secondary | ICD-10-CM

## 2012-08-30 DIAGNOSIS — Z0000 Encounter for general adult medical examination without abnormal findings: Secondary | ICD-10-CM

## 2012-08-30 NOTE — Assessment & Plan Note (Signed)
Pap smear due later this year. Mammogram will be scheduled today. Refused flu and tetanus shot.

## 2012-08-30 NOTE — Assessment & Plan Note (Signed)
Stable.  Probably from wrong prescription glasses. Waiting for the new ones to arrive. Will reevaluate if headaches continues after that.

## 2012-08-30 NOTE — Patient Instructions (Signed)
Please make a followup appointment in 6-8 months. Get the mammogram done. Keep taking all medications regularly. Call the clinic for any further questions, concerns or any refills.

## 2012-08-30 NOTE — Assessment & Plan Note (Signed)
BP Readings from Last 3 Encounters:  08/30/12 140/93  03/22/12 160/93  12/22/11 156/99    Lab Results  Component Value Date   NA 136 03/22/2012   K 4.4 03/22/2012   CREATININE 0.62 03/22/2012    Assessment:  Blood pressure control:   fair  Progress toward BP goal:    stable  Comments: Blood pressure marginally elevated. Would not make any new blood pressure medication changed today.   Plan:  Medications:  continue current medications. Continue atenolol, lisinopril.  Educational resources provided: Psychologist, counselling tools provided:    Other plans: none.

## 2012-08-30 NOTE — Progress Notes (Signed)
  Subjective:    Patient ID: Lauren [name], female    DOB: Feb 10, 1966, 47 y.o.   MRN: [id number]  HPI patient is a pleasant 47 year woman with past history of hypertension, hyperlipidemia, chronic headaches and other problems as per problem list who comes the clinic for regular followup visit. She denies any significant symptoms. Denies any fever, chills, nausea vomiting or abdominal pain, chest pain, short of breath, diarrhea. Her headaches are same as before, but she thinks it is probably from wrong prescriptions in her glasses. She is waiting for the prescriptions to arrive and hopes that would help her headaches.  Her last Pap smear along with endometrial biopsy was done in August 2011 at [facility] and was negative for malignancy.  Her last mammogram was in 2009- will schedule one for her today. She does not want to take flu or tetanus shot.   Review of Systems    as per history of present illness. Objective:   Physical Exam  General: NAD HEENT: PERRL, EOMI, no scleral icterus Cardiac: S1, S2, RRR, no rubs, murmurs or gallops Pulm: clear to auscultation bilaterally, moving normal volumes of air Abd: soft, nontender, nondistended, BS present Ext: warm and well perfused, no pedal edema Neuro: alert and oriented X3, cranial nerves II-XII grossly intact       Assessment & Plan:

## 2012-09-01 NOTE — Progress Notes (Signed)
Talked with pt about mammogram - will sch appt at [facility] due to work sch. Lauren Dullea RN 3.21/14 1:15PM

## 2012-12-12 ENCOUNTER — Ambulatory Visit
Admission: RE | Admit: 2012-12-12 | Discharge: 2012-12-12 | Disposition: A | Discharge status: Home / Self Care | Payer: PRIVATE HEALTH INSURANCE | Source: Ambulatory Visit | Attending: Internal Medicine | Admitting: Internal Medicine

## 2012-12-12 DIAGNOSIS — Z1231 Encounter for screening mammogram for malignant neoplasm of breast: Secondary | ICD-10-CM

## 2013-03-19 ENCOUNTER — Other Ambulatory Visit: Payer: Self-pay | Admitting: *Deleted

## 2013-03-19 DIAGNOSIS — I10 Essential (primary) hypertension: Secondary | ICD-10-CM

## 2013-03-19 MED ORDER — ATENOLOL 100 MG PO TABS: 100.0000 mg | ORAL_TABLET | Freq: Every day | ORAL | Status: DC

## 2013-04-11 ENCOUNTER — Other Ambulatory Visit: Payer: Self-pay | Admitting: *Deleted

## 2013-04-11 DIAGNOSIS — I10 Essential (primary) hypertension: Secondary | ICD-10-CM

## 2013-04-11 MED ORDER — LISINOPRIL 40 MG PO TABS: 40.0000 mg | ORAL_TABLET | Freq: Every day | ORAL | Status: DC

## 2013-04-12 ENCOUNTER — Encounter: Payer: Self-pay | Admitting: Internal Medicine

## 2013-06-01 ENCOUNTER — Ambulatory Visit (INDEPENDENT_AMBULATORY_CARE_PROVIDER_SITE_OTHER): Payer: BC Managed Care – PPO | Admitting: Internal Medicine

## 2013-06-01 ENCOUNTER — Encounter: Payer: Self-pay | Admitting: Internal Medicine

## 2013-06-01 VITALS — HR 68 | Temp 98.5°F | Wt 190.7 lb

## 2013-06-01 DIAGNOSIS — N920 Excessive and frequent menstruation with regular cycle: Secondary | ICD-10-CM

## 2013-06-01 DIAGNOSIS — K439 Ventral hernia without obstruction or gangrene: Secondary | ICD-10-CM

## 2013-06-01 DIAGNOSIS — M25519 Pain in unspecified shoulder: Secondary | ICD-10-CM

## 2013-06-01 DIAGNOSIS — M25551 Pain in right hip: Secondary | ICD-10-CM

## 2013-06-01 DIAGNOSIS — M25559 Pain in unspecified hip: Secondary | ICD-10-CM

## 2013-06-01 DIAGNOSIS — E785 Hyperlipidemia, unspecified: Secondary | ICD-10-CM

## 2013-06-01 DIAGNOSIS — I10 Essential (primary) hypertension: Secondary | ICD-10-CM

## 2013-06-01 DIAGNOSIS — Z0000 Encounter for general adult medical examination without abnormal findings: Secondary | ICD-10-CM

## 2013-06-01 DIAGNOSIS — Z23 Encounter for immunization: Secondary | ICD-10-CM

## 2013-06-01 LAB — LIPID PANEL
Cholesterol: 180 mg/dL (ref 0–200)
HDL: 42 mg/dL (ref >39)
LDL Cholesterol: 113 mg/dL — ABNORMAL HIGH (ref 0–99)
Total CHOL/HDL Ratio: 4.3 Ratio
Triglycerides: 127 mg/dL (ref <150)
VLDL: 25 mg/dL (ref 0–40)

## 2013-06-01 LAB — BASIC METABOLIC PANEL WITH GFR
BUN: 18 mg/dL (ref 6–23)
Chloride: 106 mEq/L (ref 96–112)
GFR, Est Non African American: >89 mL/min
Glucose, Bld: 87 mg/dL (ref 70–99)
Potassium: 4.4 mEq/L (ref 3.5–5.3)
Sodium: 140 mEq/L (ref 135–145)

## 2013-06-01 MED ORDER — AMLODIPINE BESYLATE 5 MG PO TABS: 5.0000 mg | ORAL_TABLET | Freq: Every day | ORAL | Status: DC

## 2013-06-01 NOTE — Progress Notes (Signed)
Patient ID: Lauren [name], [name]   DOB: [birth year], 47 y.o.   MRN: [id number]   Subjective:   Patient ID: Lauren [name]   DOB: Jul 15, 1965 47 y.o.   MRN: [id number]  HPI: [name] is a 47 y.o. woman with history of HTN, HL, chronic headaches who presents for follow-up.   Patient states that she has been having difficulty with her chronic bilateral hip pain for the past 2 weeks as well as her chronic right shoulder pain for the past week; no red flags.  She states she takes an Excedrin from time to time which relieves the pain, but she does not like to take medicines she doesn't absolutely need.   Patient's BP is elevated today at 173/97.  She reports good compliance with atenolol 100 mg daily and lisinopril 40 mg daily.  She has a BP cuff at home but has not been checking her BP at home.  No dizziness or nausea.  She still gets tension headaches about twice a week; only happens at work, resolves with Excedrin, no red flags.   Patient has not had a regular period in 6 months.  She has also been having hot flashes thus thinks she is entering menopause.  She knows she is due for a Pap smear but would rather do it at her next follow-up appointment.  She is unwilling to get a flu shot but will have a Tdap today.    Past Medical History  Diagnosis Date  . Hypertension   . HLD (hyperlipidemia) 2008  . Headache(784.0) 2008     improving  . Shoulder pain, right 07/2008  . Allergy     seasonal  . Heart murmur     History of Heart murmur as child.   Current Outpatient Prescriptions  Medication Sig Dispense Refill  . atenolol (TENORMIN) 100 MG tablet Take 1 tablet (100 mg total) by mouth daily.  90 tablet  4  . lisinopril (PRINIVIL,ZESTRIL) 40 MG tablet Take 1 tablet (40 mg total) by mouth daily.  30 tablet  1  . pravastatin (PRAVACHOL) 20 MG tablet Take 1 tablet (20 mg total) by mouth daily.  30 tablet  11  . amLODipine (NORVASC) 5 MG tablet Take 1 tablet (5 mg total) by  mouth daily.  30 tablet  11  . sodium chloride (OCEAN) 0.65 % nasal spray Place 1 spray into the nose as needed for congestion.  15 mL  12   No current facility-administered medications for this visit.   Family History  Problem Relation Age of Onset  . Heart disease Mother    History   Social History  . Marital Status: Single    Spouse Name: N/A    Number of Children: N/A  . Years of Education: N/A   Social History Main Topics  . Smoking status: Current Every Day Smoker -- 0.50 packs/day for 30 years    Types: Cigarettes  . Smokeless tobacco: None  . Alcohol Use: No  . Drug Use: No  . Sexual Activity: None   Other Topics Concern  . None   Social History Narrative   Works as Government social research officer at [employer], lives with significant other in [place]   No EToh, former beer drinker   No illicit drug use.   Current smoker, 1/2 PPDx 26 years   Review of Systems: Review of Systems  Constitutional: Negative for fever, chills, weight loss and malaise/fatigue.  Eyes: Negative for blurred vision.  Respiratory: Negative for cough and shortness of breath.   Cardiovascular: Negative for chest pain, palpitations and leg swelling.  Gastrointestinal: Negative for nausea, vomiting, abdominal pain, diarrhea, constipation and blood in stool.  Genitourinary: Negative for dysuria.  Musculoskeletal: Negative for falls.  Neurological: Negative for dizziness, loss of consciousness and headaches.    Objective:  Physical Exam: Filed Vitals:   06/01/13 1037  Pulse: 68  Temp: 98.5 F (36.9 C)  TempSrc: Oral  Weight: 190 lb 11.2 oz (86.501 kg)  SpO2: 100%   General: alert, cooperative, and in no apparent distress HEENT: pupils equal round and reactive to light, vision grossly intact, oropharynx clear and non-erythematous  Neck: supple, no lymphadenopathy Lungs: clear to ascultation bilaterally, normal work of respiration, no wheezes, rales, ronchi Heart: regular rate and  rhythm, no murmurs, gallops, or rubs Abdomen: soft, non-tender, non-distended, normal bowel sounds; small ventral hernia in upper abdomen, nontender Extremities: 2+ DP/PT pulses bilaterally, no cyanosis, clubbing, or edema Neurologic: alert & oriented X3, cranial nerves II-XII intact, strength grossly intact, sensation intact to light touch   Assessment & Plan:  Patient discussed with Dr. Kem Kays.  Please see problem-based assessment and plan.

## 2013-06-01 NOTE — Patient Instructions (Addendum)
Please follow-up with Dr. Aundria Rud in February.  We are prescribing you a new blood pressure medicine today called AMLODIPINE.  Please take this daily and try to record your blood pressures daily.   You got your tetanus booster shot today!  We are checking some lab tests today as well.  I will call you if the results are abnormal.   Please take ibuprofen 600 mg three times daily for the next 2 days to work on your joint pain.   We will do a Pap smear at your next visit. We will also refer you for a screening mammogram at that time.    Hypertension As your heart beats, it forces blood through your arteries. This force is your blood pressure. If the pressure is too high, it is called hypertension (HTN) or high blood pressure. HTN is dangerous because you may have it and not know it. High blood pressure may mean that your heart has to work harder to pump blood. Your arteries may be narrow or stiff. The extra work puts you at risk for heart disease, stroke, and other problems.  Blood pressure consists of two numbers, a higher number over a lower, 110/72, for example. It is stated as "110 over 72." The ideal is below 120 for the top number (systolic) and under 80 for the bottom (diastolic). Write down your blood pressure today. You should pay close attention to your blood pressure if you have certain conditions such as:  Heart failure.  Prior heart attack.  Diabetes  Chronic kidney disease.  Prior stroke.  Multiple risk factors for heart disease. To see if you have HTN, your blood pressure should be measured while you are seated with your arm held at the level of the heart. It should be measured at least twice. A one-time elevated blood pressure reading (especially in the Emergency Department) does not mean that you need treatment. There may be conditions in which the blood pressure is different between your right and left arms. It is important to see your caregiver soon for a recheck. Most  people have essential hypertension which means that there is not a specific cause. This type of high blood pressure may be lowered by changing lifestyle factors such as:  Stress.  Smoking.  Lack of exercise.  Excessive weight.  Drug/tobacco/alcohol use.  Eating less salt. Most people do not have symptoms from high blood pressure until it has caused damage to the body. Effective treatment can often prevent, delay or reduce that damage. TREATMENT  When a cause has been identified, treatment for high blood pressure is directed at the cause. There are a large number of medications to treat HTN. These fall into several categories, and your caregiver will help you select the medicines that are best for you. Medications may have side effects. You should review side effects with your caregiver. If your blood pressure stays high after you have made lifestyle changes or started on medicines,   Your medication(s) may need to be changed.  Other problems may need to be addressed.  Be certain you understand your prescriptions, and know how and when to take your medicine.  Be sure to follow up with your caregiver within the time frame advised (usually within two weeks) to have your blood pressure rechecked and to review your medications.  If you are taking more than one medicine to lower your blood pressure, make sure you know how and at what times they should be taken. Taking two medicines at the  same time can result in blood pressure that is too low. SEEK IMMEDIATE MEDICAL CARE IF:  You develop a severe headache, blurred or changing vision, or confusion.  You have unusual weakness or numbness, or a faint feeling.  You have severe chest or abdominal pain, vomiting, or breathing problems. MAKE SURE YOU:   Understand these instructions.  Will watch your condition.  Will get help right away if you are not doing well or get worse. Document Released: 05/31/2005 Document Revised: 08/23/2011  Document Reviewed: 01/19/2008 [facility] Patient Information 2014 [place], [place].

## 2013-06-02 DIAGNOSIS — K439 Ventral hernia without obstruction or gangrene: Secondary | ICD-10-CM | POA: Insufficient documentation

## 2013-06-02 NOTE — Assessment & Plan Note (Addendum)
Lipid profile today. Continue pravastatin 20 mg daily.  ADDENDUM: LDL 113 today.  Increase pravastatin dose to 40 mg daily at next visit.

## 2013-06-02 NOTE — Assessment & Plan Note (Signed)
Small, in upper abdomen.  Instructed patient to seek medical attention if she develops pain.

## 2013-06-02 NOTE — Assessment & Plan Note (Signed)
Tdap today.  Patient unwilling to take flu shot.  Normal screening mammogram in 08/2011.  Will do Pap smear at follow-up appointment in February and refer for for repeat screening mammogram at that time.

## 2013-06-02 NOTE — Assessment & Plan Note (Signed)
Instructed patient to take ibuprofen 600 mg TID x 2-3 days.  She should call and make sooner follow-up appt if pain not resolved.

## 2013-06-02 NOTE — Assessment & Plan Note (Signed)
Patient has not had a period in 6 months and also having hot flashes.  Will do Pap smear at next visit as patient deferred today.

## 2013-06-02 NOTE — Assessment & Plan Note (Signed)
Hip pain x 2 weeks.  No red flags or abnormalities on exam.  Negative xrays in 12/2011.  Instructed patient to take ibuprofen 600 mg TID x 2-3 days.  She should call and make sooner follow-up appt if pain not resolved.

## 2013-06-02 NOTE — Assessment & Plan Note (Signed)
Patient reports compliance with atenolol and lisinopril.  BP still elevated today.  Add amlodipine 5 mg daily.  Provided BP log, patient willing to check her BP daily and record it then bring log into next visit.  BMP and lipid profile today.  Follow-up in February.

## 2013-06-04 ENCOUNTER — Other Ambulatory Visit: Payer: Self-pay | Admitting: *Deleted

## 2013-06-05 MED ORDER — PRAVASTATIN SODIUM 40 MG PO TABS: 40.0000 mg | ORAL_TABLET | Freq: Every day | ORAL | Status: DC

## 2013-06-05 NOTE — Progress Notes (Signed)
I saw and evaluated the patient.  I personally confirmed the key portions of Dr. Roger's history and exam and reviewed pertinent patient test results.  The assessment, diagnosis, and plan were formulated together and I agree with the documentation in the resident's note. 

## 2013-06-18 ENCOUNTER — Other Ambulatory Visit: Payer: Self-pay | Admitting: Internal Medicine

## 2013-08-03 ENCOUNTER — Encounter: Payer: BC Managed Care – PPO | Admitting: Internal Medicine

## 2013-09-06 ENCOUNTER — Encounter: Payer: Self-pay | Admitting: Internal Medicine

## 2013-09-06 ENCOUNTER — Ambulatory Visit (INDEPENDENT_AMBULATORY_CARE_PROVIDER_SITE_OTHER): Payer: BC Managed Care – PPO | Admitting: Internal Medicine

## 2013-09-06 VITALS — BP 149/94 | HR 71 | Temp 98.3°F | Ht 66.0 in | Wt 189.3 lb

## 2013-09-06 DIAGNOSIS — Z0000 Encounter for general adult medical examination without abnormal findings: Secondary | ICD-10-CM

## 2013-09-06 DIAGNOSIS — B372 Candidiasis of skin and nail: Secondary | ICD-10-CM | POA: Insufficient documentation

## 2013-09-06 DIAGNOSIS — I10 Essential (primary) hypertension: Secondary | ICD-10-CM

## 2013-09-06 MED ORDER — AMLODIPINE BESYLATE 10 MG PO TABS: 5.0000 mg | ORAL_TABLET | Freq: Every day | ORAL | Status: DC

## 2013-09-06 MED ORDER — NYSTATIN 100000 UNIT/GM EX POWD: CUTANEOUS | Status: DC

## 2013-09-06 NOTE — Assessment & Plan Note (Signed)
BP Readings from Last 3 Encounters:  09/06/13 149/94  08/30/12 140/93  03/22/12 160/93    Lab Results  Component Value Date   NA 140 06/01/2013   K 4.4 06/01/2013   CREATININE 0.76 06/01/2013    Assessment: Blood pressure control:  slightly elevated Progress toward BP goal:   improved Comments: Started amlodipine 5 mg daily at last visit, patient reports good compliance with all anti-hypertensives.   Plan: Medications:  continue current medications with amlodipine increased to 10 mg daily (if she starts feeling dizzy or lightheaded, she will go back to amlodipine 5 mg daily and call clinic) Self management tools provided:  BP log Other plans: Follow-up in 2 months.  Reminded patient to bring her BP log at that time.

## 2013-09-06 NOTE — Assessment & Plan Note (Signed)
Pap smear, GC/chlamydia today.  Need to refer patient for screening mammogram at next visit (not showing up in health maintenance).

## 2013-09-06 NOTE — Progress Notes (Signed)
Patient ID: Lauren [name], female   DOB: 08-Jul-1965, 48 y.o.   MRN: [id number]   Subjective:   Patient ID: Lauren [name] female   DOB: [birth year] 48 y.o.   MRN: [id number]  HPI: [name] is a 48 y.o. woman with history of HTN, HL who presents for routine follow-up.   Patient states she is doing well overall, no complaints.  BP elevated at 148/92 today, repeat 149/94 but much improved from last visit (379K systolic).  She reports good compliance with atenolol, lisinopril, and amlodipine.  She has been keeping BP log at home but forgot to bring it with her today.  Reports BPs of 120s/70s at night and on weekends but up to 150s/80s-90s immediately after she gets home from work which she attributes to work stress.  Denies headache, nausea, dizziness.    States she has had an itchy rash on her upper right thigh in skin fold x 1 week which she thinks may be due to her laundry detergent though no recent change in detergent or soap, no rash elsewhere on her body.  She has been applying "medicated Vaseline" to the area with only some relief.  She is willing to get Pap today.  No period in 8 months now.  No vaginal discharge.  No history of abnormal Pap smear.  She is not interested in quitting smoking or in meeting with Butch Penny Plyler to discuss diet and weight loss.    Past Medical History  Diagnosis Date  . Hypertension   . HLD (hyperlipidemia) 2008  . Headache(784.0) 2008     improving  . Shoulder pain, right 07/2008  . Allergy     seasonal  . Heart murmur     History of Heart murmur as child.   Current Outpatient Prescriptions  Medication Sig Dispense Refill  . amLODipine (NORVASC) 10 MG tablet Take 0.5 tablets (5 mg total) by mouth daily.  30 tablet  11  . atenolol (TENORMIN) 100 MG tablet Take 1 tablet (100 mg total) by mouth daily.  90 tablet  4  . lisinopril (PRINIVIL,ZESTRIL) 40 MG tablet TAKE ONE TABLET BY MOUTH ONCE DAILY  30 tablet  5  . pravastatin (PRAVACHOL) 40 MG  tablet Take 1 tablet (40 mg total) by mouth daily.  30 tablet  11  . nystatin (MYCOSTATIN/NYSTOP) 100000 UNIT/GM POWD Apply to skin of upper right thigh twice daily.  60 g  1  . sodium chloride (OCEAN) 0.65 % nasal spray Place 1 spray into the nose as needed for congestion.  15 mL  12   No current facility-administered medications for this visit.   Family History  Problem Relation Age of Onset  . Heart disease Mother    History   Social History  . Marital Status: Single    Spouse Name: N/A    Number of Children: N/A  . Years of Education: N/A   Social History Main Topics  . Smoking status: Current Every Day Smoker -- 0.50 packs/day for 30 years    Types: Cigarettes  . Smokeless tobacco: None  . Alcohol Use: No  . Drug Use: No  . Sexual Activity: None   Other Topics Concern  . None   Social History Narrative   Works as Investment banker, corporate at [employer], lives with significant other in [place]   No EToh, former beer drinker   No illicit drug use.   Current smoker, 1/2 PPDx 26 years   Review of Systems: Review  of Systems  Constitutional: Negative for fever and weight loss.  Eyes: Negative for blurred vision.  Respiratory: Negative for cough and shortness of breath.   Cardiovascular: Negative for chest pain, palpitations and leg swelling.  Gastrointestinal: Negative for nausea, vomiting, abdominal pain, diarrhea and constipation.  Genitourinary: Negative for dysuria.  Musculoskeletal: Negative for falls.  Skin: Positive for itching and rash.       Right upper thigh in skin fold  Neurological: Negative for dizziness, loss of consciousness, weakness and headaches.    Objective:  Physical Exam: Filed Vitals:   09/06/13 1405 09/06/13 1420  BP: 148/92 149/94  Pulse: 71 71  Temp: 98.3 F (36.8 C)   TempSrc: Oral   Height: 5\' 6"  (1.676 m)   Weight: 189 lb 4.8 oz (85.866 kg)   SpO2: 100%    General: alert, cooperative, and in no apparent distress HEENT:  NCAT, vision grossly intact, oropharynx clear and non-erythematous  Neck: supple, no lymphadenopathy Lungs: clear to ascultation bilaterally, normal work of respiration, no wheezes, rales, ronchi Heart: regular rate and rhythm, no murmurs, gallops, or rubs Abdomen: soft, non-tender, non-distended, normal bowel sounds Extremities: 2+ DP/PT pulses bilaterally, no cyanosis, clubbing, or edema Neurologic: alert & oriented X3, cranial nerves II-XII intact, strength grossly intact, sensation intact to light touch Skin: hyperpigmented area with satellite lesions in skin fold of right upper thigh   Assessment & Plan:  Patient discussed with Dr. Lynnae January. Please see problem-based assessment and plan.

## 2013-09-06 NOTE — Patient Instructions (Addendum)
Follow-up with Dr. Stann Mainland in 1-2 months for blood pressure check.   Keep up the good work taking your medicines as prescribed.  We are increasing the dose of one of your blood pressure medicines called AMLODIPINE to 10 mg daily (sent in to [facility]).  If you feel dizzy or lightheaded while on this dose, please go back to 5 mg daily and call the clinic.  Work on exercising regularly and making healthy choices to keep your weight down.    We sent in a prescription for NYSTATIN powder that you should apply to the rash on your upper thigh.   We did a Pap smear and STD testing today.  I will call you if the results are abnormal.    Exercise to Lose Weight Exercise and a healthy diet may help you lose weight. Your doctor may suggest specific exercises. EXERCISE IDEAS AND TIPS  Choose low-cost things you enjoy doing, such as walking, bicycling, or exercising to workout videos.  Take stairs instead of the elevator.  Walk during your lunch break.  Park your car further away from work or school.  Go to a gym or an exercise class.  Start with 5 to 10 minutes of exercise each day. Build up to 30 minutes of exercise 4 to 6 days a week.  Wear shoes with good support and comfortable clothes.  Stretch before and after working out.  Work out until you breathe harder and your heart beats faster.  Drink extra water when you exercise.  Do not do so much that you hurt yourself, feel dizzy, or get very short of breath. Exercises that burn about 150 calories:  Running 1  miles in 15 minutes.  Playing volleyball for 45 to 60 minutes.  Washing and waxing a car for 45 to 60 minutes.  Playing touch football for 45 minutes.  Walking 1  miles in 35 minutes.  Pushing a stroller 1  miles in 30 minutes.  Playing basketball for 30 minutes.  Raking leaves for 30 minutes.  Bicycling 5 miles in 30 minutes.  Walking 2 miles in 30 minutes.  Dancing for 30 minutes.  Shoveling snow for 15  minutes.  Swimming laps for 20 minutes.  Walking up stairs for 15 minutes.  Bicycling 4 miles in 15 minutes.  Gardening for 30 to 45 minutes.  Jumping rope for 15 minutes.  Washing windows or floors for 45 to 60 minutes. Document Released: 07/03/2010 Document Revised: 08/23/2011 Document Reviewed: 07/03/2010 [facility] Patient Information 2014 [place], [place].  Hypotension As your heart beats, it forces blood through your body. This force is called blood pressure. If you have hypotension, you have low blood pressure. When your blood pressure is too low, you may not get enough blood to your brain. You may feel weak, feel lightheaded, have a fast heartbeat, or even pass out (faint). HOME CARE  Drink enough fluids to keep your pee (urine) clear or pale yellow.  Take all medicines as told by your doctor.  Get up slowly after sitting or lying down.  Wear support stockings as told by your doctor.  Maintain a healthy diet by including foods such as fruits, vegetables, nuts, whole grains, and lean meats. GET HELP IF:  You are throwing up (vomiting) or have watery poop (diarrhea).  You have a fever for more than 2 3 days.  You feel more thirsty than usual.  You feel weak and tired. GET HELP RIGHT AWAY IF:   You pass out (faint).  You  have chest pain or a fast or irregular heartbeat.  You lose feeling in part of your body.  You cannot move your arms or legs.  You have trouble speaking.  You get sweaty or feel lightheaded. MAKE SURE YOU:   Understand these instructions.  Will watch your condition.  Will get help right away if you are not doing well or get worse. Document Released: 08/25/2009 Document Revised: 01/31/2013 Document Reviewed: 12/01/2012 [facility] Patient Information 2014 [place].

## 2013-09-06 NOTE — Assessment & Plan Note (Signed)
Of right upper thigh skin fold.  Sent in nystatin powder, instructed patient to apply to affected area twice daily.

## 2013-09-07 LAB — CERVICOVAGINAL ANCILLARY ONLY
Chlamydia: NEGATIVE
NEISSERIA GONORRHEA: NEGATIVE

## 2013-09-10 NOTE — Progress Notes (Signed)
Case discussed with Dr. Rogers at the time of the visit.  We reviewed the resident's history and exam and pertinent patient test results.  I agree with the assessment, diagnosis, and plan of care documented in the resident's note. 

## 2013-10-25 ENCOUNTER — Encounter (HOSPITAL_COMMUNITY): Payer: Self-pay | Admitting: Emergency Medicine

## 2013-10-25 ENCOUNTER — Emergency Department (INDEPENDENT_AMBULATORY_CARE_PROVIDER_SITE_OTHER)
Admission: EM | Admit: 2013-10-25 | Discharge: 2013-10-25 | Disposition: A | Discharge status: Home / Self Care | Payer: PRIVATE HEALTH INSURANCE | Source: Home / Self Care | Attending: Family Medicine | Admitting: Family Medicine

## 2013-10-25 DIAGNOSIS — R002 Palpitations: Secondary | ICD-10-CM

## 2013-10-25 NOTE — ED Provider Notes (Signed)
CSN: [id number]     Arrival date & time 10/25/13  1457 History   None    Chief Complaint  Patient presents with  . URI   (Consider location/radiation/quality/duration/timing/severity/associated sxs/prior Treatment) HPI Comments: Patient reports at home today she had a few brief episodes of tachypalpitations with associated mild dizziness. Has not experienced these symptoms in the past. Denies dyspnea, chest pain, syncope or near syncope.  Does not drink alcohol or use excessive caffeine. Is a smoker PCP: [place] Also mentions several days of nasal congestion with hx of hayfever.  Reports herself to currently be symptom free.   The history is provided by the patient.    Past Medical History  Diagnosis Date  . Hypertension   . HLD (hyperlipidemia) 2008  . Headache(784.0) 2008     improving  . Shoulder pain, right 07/2008  . Allergy     seasonal  . Heart murmur     History of Heart murmur as child.   History reviewed. No pertinent past surgical history. Family History  Problem Relation Age of Onset  . Heart disease Mother    History  Substance Use Topics  . Smoking status: Current Every Day Smoker -- 0.50 packs/day for 30 years    Types: Cigarettes  . Smokeless tobacco: Not on file  . Alcohol Use: No   OB History   Grav Para Term Preterm Abortions TAB SAB Ect Mult Living                 Review of Systems  Constitutional: Negative.   HENT: Positive for congestion.   Eyes: Negative.   Respiratory: Negative.   Cardiovascular: Positive for palpitations. Negative for chest pain and leg swelling.  Gastrointestinal: Negative.   Endocrine: Negative.   Musculoskeletal: Negative.   Skin: Negative.   Neurological: Positive for dizziness. Negative for seizures, syncope, weakness, light-headedness, numbness and headaches.  Psychiatric/Behavioral: Negative for confusion and agitation. The patient is not nervous/anxious.     Allergies  Penicillins  Home Medications    Prior to Admission medications   Medication Sig Start Date End Date Taking? Authorizing Provider  amLODipine (NORVASC) 10 MG tablet Take 0.5 tablets (5 mg total) by mouth daily. 09/06/13 09/06/14  Ivin Poot, MD  atenolol (TENORMIN) 100 MG tablet Take 1 tablet (100 mg total) by mouth daily. 03/19/13 03/19/14  Ivin Poot, MD  lisinopril (PRINIVIL,ZESTRIL) 40 MG tablet TAKE ONE TABLET BY MOUTH ONCE DAILY 06/18/13   Ivin Poot, MD  nystatin (MYCOSTATIN/NYSTOP) 100000 UNIT/GM POWD Apply to skin of upper right thigh twice daily. 09/06/13   Ivin Poot, MD  pravastatin (PRAVACHOL) 40 MG tablet Take 1 tablet (40 mg total) by mouth daily. 06/04/13   Ivin Poot, MD  sodium chloride (OCEAN) 0.65 % nasal spray Place 1 spray into the nose as needed for congestion. 09/16/11 09/15/12  Coralee Pesa, MD   BP 158/94  Pulse 68  Temp(Src) 98.6 F (37 C) (Oral)  Resp 16  SpO2 97%  LMP 08/09/2012 Physical Exam  Nursing note and vitals reviewed. Constitutional: She is oriented to person, place, and time. She appears well-developed and well-nourished. No distress.  HENT:  Head: Normocephalic and atraumatic.  Eyes: Conjunctivae are normal. No scleral icterus.  Neck: Normal range of motion. Neck supple. No JVD present.  Cardiovascular: Normal rate, regular rhythm and normal heart sounds.  Exam reveals no gallop and no friction rub.   No murmur heard. Pulmonary/Chest: Effort normal and breath sounds normal. No respiratory distress. She has  no wheezes.  Abdominal: Soft. Bowel sounds are normal. She exhibits no distension. There is no tenderness.  Musculoskeletal: Normal range of motion.  Neurological: She is alert and oriented to person, place, and time.  Skin: Skin is warm and dry. No rash noted. No erythema.  Psychiatric: She has a normal mood and affect. Her behavior is normal.    ED Course  Procedures (including critical care time) Labs Review Labs Reviewed - No data to display  Imaging  Review No results found.   MDM   1. Palpitations    ECG: NSR at 64 bpm without acute ST/T wave changes or ectopy. I suggested to patient that she contact her PCP for follow up and possible placement of 24-48 hour Holter Monitor if symptoms reoccur. States she has an appointment on 11/09/2013 with her PCP and will mention symptoms then. Advised her that if symptoms become severe or persistent, she should be re-evaluated at her nearest ER.     [place], [place] 10/25/13 [phone]

## 2013-10-25 NOTE — ED Notes (Signed)
Pt  Reports  Symptoms  Of a  Fluttering  Sensation in her  Chest   With  Nasal  stuffyness       And   Congested           denys  Any   Pain   At  This  Time

## 2013-10-25 NOTE — Discharge Instructions (Signed)
Cardiac Event Monitoring A cardiac event monitor is a small recording device used to help detect abnormal heart rhythms (arrhythmias). The monitor is used to record heart rhythm when noticeable symptoms such as the following occur:  Fast heart beats (palpitations), such as heart racing or fluttering.  Dizziness.  Fainting or lightheadedness.  Unexplained weakness. The monitor is wired to two electrodes placed on your chest. Electrodes are flat, sticky disks that attach to your skin. The monitor can be worn for up to 30 days. You will wear the monitor at all times, except when bathing.  HOW TO USE YOUR CARDIAC EVENT MONITOR A technician will prepare your chest for the electrode placement. The technician will show you how to place the electrodes, how to work the monitor, and how to replace the batteries. Take time to practice using the monitor before you leave the office. Make sure you understand how to send the information from the monitor to your health care provider. This requires a telephone with a landline, not a cellphone. You need to:  Wear your monitor at all times, except when you are in water:  Do not get the monitor wet.  Take the monitor off when bathing. Do not swim or use a hot tub with it on.  Keep your skin clean. Do not put body lotion or moisturizer on your chest.  Change the electrodes daily or any time they stop sticking to your skin. You might need to use tape to keep them on.  It is possible that your skin under the electrodes could become irritated. To keep this from happening, try to put the electrodes in slightly different places on your chest. However, they must remain in the area under your left breast and in the upper right section of your chest.  Make sure the monitor is safely clipped to your clothing or in a location close to your body that your health care provider recommends.  Press the button to record when you feel symptoms of heart trouble, such as  dizziness, weakness, lightheadedness, palpitations, thumping, shortness of breath, unexplained weakness, or a fluttering or racing heart. The monitor is always on and records what happened slightly before you pressed the button, so do not worry about being too late to get good information.  Keep a diary of your activities, such as walking, doing chores, and taking medicine. It is especially important to note what you were doing when you pushed the button to record your symptoms. This will help your health care provider determine what might be contributing to your symptoms. The information stored in your monitor will be reviewed by your health care provider alongside your diary entries.  Send the recorded information as recommended by your health care provider. It is important to understand that it will take some time for your health care provider to process the results.  Change the batteries as recommended by your health care provider. SEEK IMMEDIATE MEDICAL CARE IF:   You have chest pain.  You have extreme difficulty breathing or shortness of breath.  You develop a very fast heartbeat that persists.  You develop dizziness that does not go away .  You faint or constantly feel you are about to faint. Document Released: 03/09/2008 Document Revised: 01/31/2013 Document Reviewed: 11/27/2012 [facility] Patient Information 2014 [place], [place].  Palpitations  A palpitation is the feeling that your heartbeat is irregular or is faster than normal. It may feel like your heart is fluttering or skipping a beat. Palpitations are usually not  a serious problem. However, in some cases, you may need further medical evaluation. CAUSES  Palpitations can be caused by:  Smoking.  Caffeine or other stimulants, such as diet pills or energy drinks.  Alcohol.  Stress and anxiety.  Strenuous physical activity.  Fatigue.  Certain medicines.  Heart disease, especially if you have a history of arrhythmias.  This includes atrial fibrillation, atrial flutter, or supraventricular tachycardia.  An improperly working pacemaker or defibrillator. DIAGNOSIS  To find the cause of your palpitations, your caregiver will take your history and perform a physical exam. Tests may also be done, including:  Electrocardiography (ECG). This test records the heart's electrical activity.  Cardiac monitoring. This allows your caregiver to monitor your heart rate and rhythm in real time.  Holter monitor. This is a portable device that records your heartbeat and can help diagnose heart arrhythmias. It allows your caregiver to track your heart activity for several days, if needed.  Stress tests by exercise or by giving medicine that makes the heart beat faster. TREATMENT  Treatment of palpitations depends on the cause of your symptoms and can vary greatly. Most cases of palpitations do not require any treatment other than time, relaxation, and monitoring your symptoms. Other causes, such as atrial fibrillation, atrial flutter, or supraventricular tachycardia, usually require further treatment. HOME CARE INSTRUCTIONS   Avoid:  Caffeinated coffee, tea, soft drinks, diet pills, and energy drinks.  Chocolate.  Alcohol.  Stop smoking if you smoke.  Reduce your stress and anxiety. Things that can help you relax include:  A method that measures bodily functions so you can learn to control them (biofeedback).  Yoga.  Meditation.  Physical activity such as swimming, jogging, or walking.  Get plenty of rest and sleep. SEEK MEDICAL CARE IF:   You continue to have a fast or irregular heartbeat beyond 24 hours.  Your palpitations occur more often. SEEK IMMEDIATE MEDICAL CARE IF:  You develop chest pain or shortness of breath.  You have a severe headache.  You feel dizzy, or you faint. MAKE SURE YOU:  Understand these instructions.  Will watch your condition.  Will get help right away if you are not  doing well or get worse. Document Released: 05/28/2000 Document Revised: 09/25/2012 Document Reviewed: 07/30/2011 [facility] Patient Information 2014 [place].

## 2013-10-28 NOTE — ED Provider Notes (Signed)
Medical screening examination/treatment/procedure(s) were performed by a resident physician or non-physician practitioner and as the supervising physician I was immediately available for consultation/collaboration.  Deontrey Massi, MD    Garima Chronis S Curlee Bogan, MD 10/28/13 0843 

## 2013-11-09 ENCOUNTER — Ambulatory Visit (INDEPENDENT_AMBULATORY_CARE_PROVIDER_SITE_OTHER): Payer: PRIVATE HEALTH INSURANCE | Admitting: Internal Medicine

## 2013-11-09 ENCOUNTER — Encounter: Payer: Self-pay | Admitting: Internal Medicine

## 2013-11-09 VITALS — BP 112/74 | HR 70 | Temp 98.2°F | Ht 66.0 in | Wt 190.2 lb

## 2013-11-09 DIAGNOSIS — B372 Candidiasis of skin and nail: Secondary | ICD-10-CM

## 2013-11-09 DIAGNOSIS — Z0000 Encounter for general adult medical examination without abnormal findings: Secondary | ICD-10-CM

## 2013-11-09 DIAGNOSIS — I10 Essential (primary) hypertension: Secondary | ICD-10-CM

## 2013-11-09 DIAGNOSIS — E785 Hyperlipidemia, unspecified: Secondary | ICD-10-CM

## 2013-11-09 NOTE — Patient Instructions (Signed)
Follow-up in 6 months.   Keep up the good work taking your medicines as prescribed.  Please try to remember to bring your medicine bottles to your next appointment.  Congratulations on decreasing your smoking!  As we discussed, you can call 1-800-QUIT-NOW for more information and to see about getting nicotine patches.      Smoking Cessation, Tips for Success If you are ready to quit smoking, congratulations! You have chosen to help yourself be healthier. Cigarettes bring nicotine, tar, carbon monoxide, and other irritants into your body. Your lungs, heart, and blood vessels will be able to work better without these poisons. There are many different ways to quit smoking. Nicotine gum, nicotine patches, a nicotine inhaler, or nicotine nasal spray can help with physical craving. Hypnosis, support groups, and medicines help break the habit of smoking. WHAT THINGS CAN I DO TO MAKE QUITTING EASIER?  Here are some tips to help you quit for good:  Pick a date when you will quit smoking completely. Tell all of your friends and family about your plan to quit on that date.  Do not try to slowly cut down on the number of cigarettes you are smoking. Pick a quit date and quit smoking completely starting on that day.  Throw away all cigarettes.   Clean and remove all ashtrays from your home, work, and car.   On a card, write down your reasons for quitting. Carry the card with you and read it when you get the urge to smoke.   Cleanse your body of nicotine. Drink enough water and fluids to keep your urine clear or pale yellow. Do this after quitting to flush the nicotine from your body.   Learn to predict your moods. Do not let a bad situation be your excuse to have a cigarette. Some situations in your life might tempt you into wanting a cigarette.   Never have "just one" cigarette. It leads to wanting another and another. Remind yourself of your decision to quit.   Change habits associated with  smoking. If you smoked while driving or when feeling stressed, try other activities to replace smoking. Stand up when drinking your coffee. Brush your teeth after eating. Sit in a different chair when you read the paper. Avoid alcohol while trying to quit, and try to drink fewer caffeinated beverages. Alcohol and caffeine may urge you to smoke.   Avoid foods and drinks that can trigger a desire to smoke, such as sugary or spicy foods and alcohol.   Ask people who smoke not to smoke around you.   Have something planned to do right after eating or having a cup of coffee. For example, plan to take a walk or exercise.   Try a relaxation exercise to calm you down and decrease your stress. Remember, you may be tense and nervous for the first 2 weeks after you quit, but this will pass.   Find new activities to keep your hands busy. Play with a pen, coin, or rubber band. Doodle or draw things on paper.   Brush your teeth right after eating. This will help cut down on the craving for the taste of tobacco after meals. You can also try mouthwash.   Use oral substitutes in place of cigarettes. Try using lemon drops, carrots, cinnamon sticks, or chewing gum. Keep them handy so they are available when you have the urge to smoke.   When you have the urge to smoke, try deep breathing.   Designate your home  as a nonsmoking area.   If you are a heavy smoker, ask your health care provider about a prescription for nicotine chewing gum. It can ease your withdrawal from nicotine.   Reward yourself. Set aside the cigarette money you save and buy yourself something nice.   Look for support from others. Join a support group or smoking cessation program. Ask someone at home or at work to help you with your plan to quit smoking.   Always ask yourself, "Do I need this cigarette or is this just a reflex?" Tell yourself, "Today, I choose not to smoke," or "I do not want to smoke." You are reminding yourself  of your decision to quit.  Do not replace cigarette smoking with electronic cigarettes (commonly called e-cigarettes). The safety of e-cigarettes is unknown, and some may contain harmful chemicals.  If you relapse, do not give up! Plan ahead and think about what you will do the next time you get the urge to smoke.  HOW WILL I FEEL WHEN I QUIT SMOKING? You may have symptoms of withdrawal because your body is used to nicotine (the addictive substance in cigarettes). You may crave cigarettes, be irritable, feel very hungry, cough often, get headaches, or have difficulty concentrating. The withdrawal symptoms are only temporary. They are strongest when you first quit but will go away within 10 14 days. When withdrawal symptoms occur, stay in control. Think about your reasons for quitting. Remind yourself that these are signs that your body is healing and getting used to being without cigarettes. Remember that withdrawal symptoms are easier to treat than the major diseases that smoking can cause.  Even after the withdrawal is over, expect periodic urges to smoke. However, these cravings are generally short lived and will go away whether you smoke or not. Do not smoke!  WHAT RESOURCES ARE AVAILABLE TO HELP ME QUIT SMOKING? Your health care provider can direct you to community resources or hospitals for support, which may include:  Group support.  Education.  Hypnosis.  Therapy. Document Released: 02/27/2004 Document Revised: 03/21/2013 Document Reviewed: 11/16/2012 [facility] Patient Information 2014 [place], [place].  Menopause Menopause is the normal time of life when menstrual periods stop completely. Menopause is complete when you have missed 12 consecutive menstrual periods. It usually occurs between the ages of 73 years and 86 years. Very rarely does a woman develop menopause before the age of 29 years. At menopause, your ovaries stop producing the female hormones estrogen and progesterone. This  can cause undesirable symptoms and also affect your health. Sometimes the symptoms may occur 4 5 years before the menopause begins. There is no relationship between menopause and:  Oral contraceptives.  Number of children you had.  Race.  The age your menstrual periods started (menarche). Heavy smokers and very thin women may develop menopause earlier in life. CAUSES  The ovaries stop producing the female hormones estrogen and progesterone.  Other causes include:  Surgery to remove both ovaries.  The ovaries stop functioning for no known reason.  Tumors of the pituitary gland in the brain.  Medical disease that affects the ovaries and hormone production.  Radiation treatment to the abdomen or pelvis.  Chemotherapy that affects the ovaries. SYMPTOMS   Hot flashes.  Night sweats.  Decrease in sex drive.  Vaginal dryness and thinning of the vagina causing painful intercourse.  Dryness of the skin and developing wrinkles.  Headaches.  Tiredness.  Irritability.  Memory problems.  Weight gain.  Bladder infections.  Hair growth  of the face and chest.  Infertility. More serious symptoms include:  Loss of bone (osteoporosis) causing breaks (fractures).  Depression.  Hardening and narrowing of the arteries (atherosclerosis) causing heart attacks and strokes. DIAGNOSIS   When the menstrual periods have stopped for 12 straight months.  Physical exam.  Hormone studies of the blood. TREATMENT  There are many treatment choices and nearly as many questions about them. The decisions to treat or not to treat menopausal changes is an individual choice made with your health care provider. Your health care provider can discuss the treatments with you. Together, you can decide which treatment will work best for you. Your treatment choices may include:   Hormone therapy (estrogen and progesterone).  Non-hormonal medicines.  Treating the individual symptoms with  medicine (for example antidepressants for depression).  Herbal medicines that may help specific symptoms.  Counseling by a psychiatrist or psychologist.  Group therapy.  Lifestyle changes including:  Eating healthy.  Regular exercise.  Limiting caffeine and alcohol.  Stress management and meditation.  No treatment. HOME CARE INSTRUCTIONS   Take the medicine your health care provider gives you as directed.  Get plenty of sleep and rest.  Exercise regularly.  Eat a diet that contains calcium (good for the bones) and soy products (acts like estrogen hormone).  Avoid alcoholic beverages.  Do not smoke.  If you have hot flashes, dress in layers.  Take supplements, calcium, and vitamin D to strengthen bones.  You can use over-the-counter lubricants or moisturizers for vaginal dryness.  Group therapy is sometimes very helpful.  Acupuncture may be helpful in some cases. SEEK MEDICAL CARE IF:   You are not sure you are in menopause.  You are having menopausal symptoms and need advice and treatment.  You are still having menstrual periods after age 16 years.  You have pain with intercourse.  Menopause is complete (no menstrual period for 12 months) and you develop vaginal bleeding.  You need a referral to a specialist (gynecologist, psychiatrist, or psychologist) for treatment. SEEK IMMEDIATE MEDICAL CARE IF:   You have severe depression.  You have excessive vaginal bleeding.  You fell and think you have a broken bone.  You have pain when you urinate.  You develop leg or chest pain.  You have a fast pounding heart beat (palpitations).  You have severe headaches.  You develop vision problems.  You feel a lump in your breast.  You have abdominal pain or severe indigestion. Document Released: 08/21/2003 Document Revised: 01/31/2013 Document Reviewed: 12/28/2012 [facility] Patient Information 2014 [place], [place].

## 2013-11-09 NOTE — Assessment & Plan Note (Signed)
BP Readings from Last 3 Encounters:  11/09/13 112/74  10/25/13 158/94  09/06/13 149/94    Lab Results  Component Value Date   NA 140 06/01/2013   K 4.4 06/01/2013   CREATININE 0.76 06/01/2013    Assessment: Blood pressure control:  controlled Progress toward BP goal:   improved  Plan: Medications:  continue current medications amlodipine 10 mg daily, atenolol 100 mg daily, lisinopril 40 mg daily Self management tools provided: home blood pressure logbook Other plans: Follow-up in 6 months.

## 2013-11-09 NOTE — Progress Notes (Signed)
Patient ID: Lauren [name], female   DOB: [birth year], 48 y.o.   MRN: [id number]   Subjective:   Patient ID: Lauren [name] female   DOB: August 26, 1965 48 y.o.   MRN: [id number]  HPI: Lauren [name] is a 48 y.o. woman with history of HTN, HL who presents for routine follow-up.   Patient states she is doing quite well overall.  BP 112/74 today.  She reports good compliance with amlodipine, atenolol, and lisinopril.  She has not been checking her blood pressure at home but has been working on eating a healthier diet as well as decreasing smoking (down to 4 cigarettes/day).  Denies headache, nausea, dizziness.    Her right upper thigh rash has resolved.  She was not able to fill the prescription for Nystatin due to financial constraints but applied Blue Star ointment that she got over the counter, and rash resolved after.    Her last mammogram was in 12/2012 so she will be due for repeat at next visit.   Past Medical History  Diagnosis Date  . Hypertension   . HLD (hyperlipidemia) 2008  . Headache(784.0) 2008     improving  . Shoulder pain, right 07/2008  . Allergy     seasonal  . Heart murmur     History of Heart murmur as child.   Current Outpatient Prescriptions  Medication Sig Dispense Refill  . amLODipine (NORVASC) 10 MG tablet Take 0.5 tablets (5 mg total) by mouth daily.  30 tablet  11  . atenolol (TENORMIN) 100 MG tablet Take 1 tablet (100 mg total) by mouth daily.  90 tablet  4  . lisinopril (PRINIVIL,ZESTRIL) 40 MG tablet TAKE ONE TABLET BY MOUTH ONCE DAILY  30 tablet  5  . nystatin (MYCOSTATIN/NYSTOP) 100000 UNIT/GM POWD Apply to skin of upper right thigh twice daily.  60 g  1  . pravastatin (PRAVACHOL) 40 MG tablet Take 1 tablet (40 mg total) by mouth daily.  30 tablet  11  . sodium chloride (OCEAN) 0.65 % nasal spray Place 1 spray into the nose as needed for congestion.  15 mL  12   No current facility-administered medications for this visit.   Family History  Problem  Relation Age of Onset  . Heart disease Mother    History   Social History  . Marital Status: Single    Spouse Name: N/A    Number of Children: N/A  . Years of Education: N/A   Social History Main Topics  . Smoking status: Current Every Day Smoker -- 0.50 packs/day for 30 years    Types: Cigarettes  . Smokeless tobacco: None     Comment: Cutting back  . Alcohol Use: No  . Drug Use: No  . Sexual Activity: None   Other Topics Concern  . 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   Current smoker, 1/2 PPDx 26 years   Review of Systems: Review of Systems  Constitutional: Negative for fever.  Eyes: Negative for blurred vision.  Respiratory: Negative for cough and shortness of breath.   Cardiovascular: Negative for chest pain and leg swelling.  Gastrointestinal: Negative for nausea, vomiting, abdominal pain, diarrhea and constipation.  Genitourinary: Negative for dysuria.  Musculoskeletal: Negative for falls.  Neurological: Negative for dizziness, loss of consciousness and headaches.    Objective:  Physical Exam: Filed Vitals:   11/09/13 1328  BP: 112/74  Pulse: 70  Temp: 98.2 F (36.8 C)  TempSrc: Oral  Height: 5\' 6"  (1.676 m)  Weight: 190 lb 3.2 oz (86.274 kg)  SpO2: 99%   General: alert, cooperative, and in no apparent distress HEENT: NCAT, vision grossly intact, oropharynx clear and non-erythematous  Neck: supple, no lymphadenopathy Lungs: clear to ascultation bilaterally, normal work of respiration, no wheezes, rales, ronchi Heart: regular rate and rhythm, no murmurs, gallops, or rubs Abdomen: soft, non-tender, non-distended, normal bowel sounds Extremities: 2+ DP/PT pulses bilaterally, no cyanosis, clubbing, or edema Neurologic: alert & oriented X3, cranial nerves II-XII intact, strength grossly intact, sensation intact  to light touch  Assessment & Plan:  Patient discussed with Dr. Dareen Piano. Please see problem-based assessment and plan.

## 2013-11-09 NOTE — Assessment & Plan Note (Signed)
Continue pravastatin 40 mg daily. 

## 2013-11-09 NOTE — Assessment & Plan Note (Signed)
Resolved, see HPI.

## 2013-11-09 NOTE — Assessment & Plan Note (Signed)
Mammogram referral at next visit.

## 2013-12-13 ENCOUNTER — Other Ambulatory Visit: Payer: Self-pay | Admitting: Internal Medicine

## 2014-01-29 ENCOUNTER — Other Ambulatory Visit (HOSPITAL_COMMUNITY): Payer: Self-pay | Admitting: Internal Medicine

## 2014-01-29 DIAGNOSIS — Z1231 Encounter for screening mammogram for malignant neoplasm of breast: Secondary | ICD-10-CM

## 2014-02-06 ENCOUNTER — Ambulatory Visit
Admission: RE | Admit: 2014-02-06 | Discharge: 2014-02-06 | Disposition: A | Discharge status: Home / Self Care | Payer: BC Managed Care – PPO | Source: Ambulatory Visit | Attending: Internal Medicine | Admitting: Internal Medicine

## 2014-02-06 DIAGNOSIS — Z1231 Encounter for screening mammogram for malignant neoplasm of breast: Secondary | ICD-10-CM

## 2014-03-14 ENCOUNTER — Ambulatory Visit (HOSPITAL_COMMUNITY)
Admission: RE | Admit: 2014-03-14 | Discharge: 2014-03-14 | Disposition: A | Discharge status: Home / Self Care | Payer: BC Managed Care – PPO | Source: Ambulatory Visit | Attending: Internal Medicine | Admitting: Internal Medicine

## 2014-03-14 ENCOUNTER — Ambulatory Visit (INDEPENDENT_AMBULATORY_CARE_PROVIDER_SITE_OTHER): Payer: BC Managed Care – PPO | Admitting: Internal Medicine

## 2014-03-14 ENCOUNTER — Encounter: Payer: Self-pay | Admitting: Internal Medicine

## 2014-03-14 VITALS — BP 129/79 | HR 69 | Temp 98.1°F | Ht 66.0 in | Wt 182.5 lb

## 2014-03-14 DIAGNOSIS — Z720 Tobacco use: Secondary | ICD-10-CM | POA: Diagnosis not present

## 2014-03-14 DIAGNOSIS — I10 Essential (primary) hypertension: Secondary | ICD-10-CM

## 2014-03-14 DIAGNOSIS — M25511 Pain in right shoulder: Secondary | ICD-10-CM

## 2014-03-14 DIAGNOSIS — M25552 Pain in left hip: Secondary | ICD-10-CM | POA: Insufficient documentation

## 2014-03-14 DIAGNOSIS — M25551 Pain in right hip: Secondary | ICD-10-CM | POA: Insufficient documentation

## 2014-03-14 DIAGNOSIS — M5032 Other cervical disc degeneration, mid-cervical region: Secondary | ICD-10-CM | POA: Insufficient documentation

## 2014-03-14 DIAGNOSIS — M4602 Spinal enthesopathy, cervical region: Secondary | ICD-10-CM | POA: Diagnosis not present

## 2014-03-14 DIAGNOSIS — M542 Cervicalgia: Secondary | ICD-10-CM | POA: Diagnosis present

## 2014-03-14 DIAGNOSIS — F17210 Nicotine dependence, cigarettes, uncomplicated: Secondary | ICD-10-CM | POA: Insufficient documentation

## 2014-03-14 DIAGNOSIS — M25512 Pain in left shoulder: Secondary | ICD-10-CM | POA: Insufficient documentation

## 2014-03-14 MED ORDER — MELOXICAM 7.5 MG PO TABS: 7.5000 mg | ORAL_TABLET | Freq: Every day | ORAL | Status: AC

## 2014-03-14 NOTE — Assessment & Plan Note (Signed)
Assessment: Most likely diagnosis: Left hip osteoarthritis.  She has no risk factors for AVN of the left hip besides smoking. I do not suspect septic arthritis. She had similar symptoms in the right hip and x-ray in 2013 was unremarkable.   Plan: 1. Labs/imaging: Left hip complete x-ray 2. Therapy: Mobic 7.5 mg 15 mg daily 3. Follow up: Follow up in 2-3 weeks.

## 2014-03-14 NOTE — Progress Notes (Signed)
Case discussed with Dr. Alice Rieger soon after the resident saw the patient. We reviewed the resident's history and exam and pertinent patient test results. I agree with the assessment, diagnosis, and plan of care documented in the resident's note.  Will empirically treat for osteoarthritis of the hip and shoulder with an NSAID class she has not tried as of yet.  If no relief after 2 weeks will consider alternative diagnoses including tendonitis or bursitis.  Especially in the shoulder, an injection may be indicated.  The shoulder X-ray is to look for either osteoarthritis or calcific tendonitis.

## 2014-03-14 NOTE — Patient Instructions (Signed)
General Instructions: Please take Mobic 7.5 mg daily  I will send you for x rays of the left hip, right shoulder and neck  I will call you with the results Please come back in 2-3 weeks if your pain persists Please stop smoking and if you need help, please call [phone] [phone]    Treatment Goals:  Goals (1 Years of Data) as of 03/14/14   None      Progress Toward Treatment Goals:  Treatment Goal 03/14/2014  Blood pressure at goal  Stop smoking smoking the same amount    Self Care Goals & Plans:  Self Care Goal 03/14/2014  Manage my medications take my medicines as prescribed; bring my medications to every visit; refill my medications on time  Monitor my health -  Eat healthy foods -  Be physically active -  Stop smoking -    No flowsheet data found.   Care Management & Community Referrals:  No flowsheet data found.

## 2014-03-14 NOTE — Assessment & Plan Note (Signed)
Smokes 8cig/day. She want to quit but she is not sure when she will do that.  Encouraged her to quit and indicated that this will help reduce her risk for cancer.  Gave her QUIT-NOW information.

## 2014-03-14 NOTE — Progress Notes (Signed)
Patient ID: Lauren [name], female   DOB: 08/17/1965, 48 y.o.   MRN: [id number]   Subjective:   HPI: Lauren [name] is a 48 y.o. woman with history of HTN, HL who presents with pain in the left hip and right shoulder.  Last office visit was 11/09/2013.   Left hip pain:  She complains of several weeks of increasing left hip pain. She describes her pain as dull, and present all the time without relieving or exacerbating factors. She rates her pain as a 10 out of 10. The pain does not wake her up in the middle of the night. She has tried 2 pills of extra strength Tylenol 3 times daily without much relief. She has also tried Aleve without improvement. She had had similar pain of the right hip, but this has somehow improved. She denies back pain. She reports that even though she is in pain, she has continued doing things she enjoys doing and it has not been limiting her work. Right hip x-ray in 2013 was normal.  Right shoulder pain: At the same, patient is complaining of right shoulder pain, mainly located in the posterior aspect of her right shoulder joint, extending from the neck, down into the shoulder joint. She has a history of tendinitis of the shoulder joint, which initially had improved, but now it seems like similar pain has recurred. The pain is increased by moving her arm. No fevers or chills. No history of trauma. She endorses some neck pain, but without history of neck problems or trauma. She denies abdominal pain, nausea, vomiting, or other abdominal symptoms.  She is compliant with her antihypertensive medications.  She continues to smoke about 8 cigarettes/day since age 71.  She is concerned about cancer since her mother died from brain cancer (she was a smoker) and she has an aunt who died from colon cancer at age 88. She is up-to-date with her regular cancer screening including mammograms and Pap smears. I discussed with her that when she turns 48, she will need to get a  colonoscopy. I discussed about screening for other malignancies. Encouraged her to stop smoking as this will reduce her risk of cancer.   ROS: Constitutional: Denies fever, chills, diaphoresis, appetite change and fatigue.  Respiratory: Denies SOB, DOE, cough, chest tightness, and wheezing. Denies chest pain. CVS: No chest pain, palpitations and leg swelling.  GI: No abdominal pain, nausea, vomiting, bloody stools GU: No dysuria, frequency, hematuria, or flank pain.  MSK: No other joints involved.  Psych: No depression symptoms. No SI or SA.   Past Medical History  Diagnosis Date  . Hypertension   . HLD (hyperlipidemia) 2008  . Headache(784.0) 2008     improving  . Shoulder pain, right 07/2008  . Allergy     seasonal  . Heart murmur     History of Heart murmur as child.   Current Outpatient Prescriptions  Medication Sig Dispense Refill  . amLODipine (NORVASC) 10 MG tablet Take 0.5 tablets (5 mg total) by mouth daily.  30 tablet  11  . atenolol (TENORMIN) 100 MG tablet Take 1 tablet (100 mg total) by mouth daily.  90 tablet  4  . lisinopril (PRINIVIL,ZESTRIL) 40 MG tablet TAKE ONE TABLET BY MOUTH ONCE DAILY  30 tablet  6  . nystatin (MYCOSTATIN/NYSTOP) 100000 UNIT/GM POWD Apply to skin of upper right thigh twice daily.  60 g  1  . pravastatin (PRAVACHOL) 40 MG tablet Take 1 tablet (40 mg total) by mouth  daily.  30 tablet  11  . sodium chloride (OCEAN) 0.65 % nasal spray Place 1 spray into the nose as needed for congestion.  15 mL  12   No current facility-administered medications for this visit.   Family History  Problem Relation Age of Onset  . Heart disease Mother    History   Social History  . Marital Status: Single    Spouse Name: N/A    Number of Children: N/A  . Years of Education: N/A   Social History Main Topics  . Smoking status: Current Every Day Smoker -- 0.50 packs/day for 30 years    Types: Cigarettes  . Smokeless tobacco: Not on file     Comment: Cutting  back  . Alcohol Use: No  . Drug Use: No  . Sexual Activity: Not on file   Other Topics Concern  . 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   Current smoker, 1/2 PPDx 26 years    Objective:  Physical Exam: Filed Vitals:   03/14/14 1032  BP: 129/79  Pulse: 69  Temp: 98.1 F (36.7 C)  TempSrc: Oral  Height: 5\' 6"  (1.676 m)  Weight: 182 lb 8 oz (82.781 kg)  SpO2: 100%   General: Well nourished. No acute distress.  HEENT: Normal oral mucosa. MMM.  Lungs: CTA bilaterally. Heart: RRR; no extra sounds or murmurs  Abdomen: Non-distended, normal BS, soft, nontender; no hepatosplenomegaly  Extremities: left hip with significant tenderness on internal and external rotation. No localized tenderness of the left hip. Noted antalgic gait with left LE preference.  Right shoulder: Tenderness over the trapezius muscle, and the mostly in the posterior aspect of her shoulder joint. Not much tenderness on the anterior side. External rotation of the shoulder produces marked tenderness. No drop arm test is negative. She is able to touch on the tip of the contralateral shoulder without much difficulties.  Otherwise, extremities without pedal edema or joint swelling  Back: no tenderness noted. Neurologic: Normal EOM,  Alert and oriented x3. No obvious neurologic/cranial nerve deficits.  Assessment & Plan:  I have discussed my assessment and plan  with  my attending in the clinic, Dr. Eppie Gibson  as detailed under problem based charting.

## 2014-03-14 NOTE — Assessment & Plan Note (Addendum)
Assessment: Most likely diagnosis: osteoarthritis in view of recurrence of her pain and involvement of her hip joints. Adhesive capsulitis and cervical radiculopathy cannot be excluded. Right shoulder plain x-ray in 03/2009 was unremarkable.  Plan: 1. Labs/imaging: Right shoulder x-ray and cervical spine x-ray 2. Therapy: Trial of Mobic 7.5-15 mg daily.  3. Follow up: If NSAIDs do not help her pain, we can consider steroid injection, especially if x-ray reveals adhesive capsulitis. Will follow up in 2-3 weeks.

## 2014-03-14 NOTE — Assessment & Plan Note (Signed)
BP Readings from Last 3 Encounters:  03/14/14 129/79  11/09/13 112/74  10/25/13 158/94    Lab Results  Component Value Date   NA 140 06/01/2013   K 4.4 06/01/2013   CREATININE 0.76 06/01/2013    Assessment: Blood pressure control: controlled Progress toward BP goal:  at goal Comments: compliant with medications  Plan: Medications:  Lisinopril 40 mg daily, amlodipine, 5 mg daily, and Atenolol 100 mg daily. Educational resources provided:   Self management tools provided:   Other plans: regular follow up.

## 2014-03-15 ENCOUNTER — Telehealth: Payer: Self-pay | Admitting: Internal Medicine

## 2014-03-15 NOTE — Telephone Encounter (Signed)
Called the patient about the results and agreed to try Meloxicam for 2-3 weeks and see how the pain is doing. She was initially fearful of the side effects of the medication after reading the inserts.

## 2014-03-15 NOTE — Telephone Encounter (Signed)
Tried to call her to provided results from x rays performed yesterday but no answer on her cell phone [phone]. I will try to call again later.

## 2014-04-04 ENCOUNTER — Ambulatory Visit (INDEPENDENT_AMBULATORY_CARE_PROVIDER_SITE_OTHER): Payer: BC Managed Care – PPO | Admitting: Internal Medicine

## 2014-04-04 ENCOUNTER — Encounter: Payer: Self-pay | Admitting: Internal Medicine

## 2014-04-04 VITALS — BP 120/70 | HR 70 | Temp 98.0°F | Ht 66.0 in | Wt 181.0 lb

## 2014-04-04 DIAGNOSIS — I10 Essential (primary) hypertension: Secondary | ICD-10-CM | POA: Diagnosis not present

## 2014-04-04 DIAGNOSIS — M25511 Pain in right shoulder: Secondary | ICD-10-CM

## 2014-04-04 DIAGNOSIS — M25552 Pain in left hip: Secondary | ICD-10-CM | POA: Diagnosis not present

## 2014-04-04 DIAGNOSIS — Z0000 Encounter for general adult medical examination without abnormal findings: Secondary | ICD-10-CM

## 2014-04-04 NOTE — Progress Notes (Signed)
Patient ID: Lauren [name], female   DOB: May 18, 1966, 48 y.o.   MRN: [id number]    Subjective:   Patient ID: Lauren [name] female    DOB: Apr 26, 1966 48 y.o.    MRN: [id number] Health Maintenance Due: Health Maintenance Due  Topic Date Due  . Tetanus/tdap  09/28/1984    _________________________________________________  HPI: Lauren [name] is a 48 y.o. female here for a follow up visit of right shoulder and left hip pain.  Pt has a PMH outlined below.  Please see problem-based charting assessment and plan note for further details of medical issues addressed at today's visit.  PMH: Past Medical History  Diagnosis Date  . Hypertension   . HLD (hyperlipidemia) 2008  . Headache(784.0) 2008     improving  . Shoulder pain, right 07/2008  . Allergy     seasonal  . Heart murmur     History of Heart murmur as child.    Medications: Current Outpatient Prescriptions on File Prior to Visit  Medication Sig Dispense Refill  . amLODipine (NORVASC) 10 MG tablet Take 0.5 tablets (5 mg total) by mouth daily.  30 tablet  11  . atenolol (TENORMIN) 100 MG tablet Take 1 tablet (100 mg total) by mouth daily.  90 tablet  4  . lisinopril (PRINIVIL,ZESTRIL) 40 MG tablet TAKE ONE TABLET BY MOUTH ONCE DAILY  30 tablet  6  . meloxicam (MOBIC) 7.5 MG tablet Take 1 tablet (7.5 mg total) by mouth daily.  30 tablet  2  . nystatin (MYCOSTATIN/NYSTOP) 100000 UNIT/GM POWD Apply to skin of upper right thigh twice daily.  60 g  1  . pravastatin (PRAVACHOL) 40 MG tablet Take 1 tablet (40 mg total) by mouth daily.  30 tablet  11  . sodium chloride (OCEAN) 0.65 % nasal spray Place 1 spray into the nose as needed for congestion.  15 mL  12   No current facility-administered medications on file prior to visit.    Allergies: Allergies  Allergen Reactions  . Penicillins     REACTION: Hives and swelling    FH: Family History  Problem Relation Age of Onset  . Heart disease Mother     SH: History     Social History  . Marital Status: Single    Spouse Name: N/A    Number of Children: N/A  . Years of Education: N/A   Social History Main Topics  . Smoking status: Current Every Day Smoker -- 0.50 packs/day for 30 years    Types: Cigarettes  . Smokeless tobacco: None     Comment: Cutting back  . Alcohol Use: No  . Drug Use: No  . Sexual Activity: None   Other Topics Concern  . 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   Current smoker, 1/2 PPDx 26 years    Review of Systems: Constitutional: Negative for fever, chills and weight loss.  Eyes: Negative for blurred vision.  Respiratory: Negative for cough and shortness of breath.  Cardiovascular: Negative for chest pain, palpitations and leg swelling.  Gastrointestinal: Negative for nausea, vomiting, abdominal pain, diarrhea, constipation and blood in stool.  Genitourinary: Negative for dysuria, urgency and frequency.  Musculoskeletal: Negative for myalgias and back pain.  Neurological: Negative for dizziness, weakness and headaches.     Objective:   Vital Signs:  Filed Vitals:   04/04/14 1030 04/04/14 1115  BP: 155/73 120/70  Pulse: 68 70  Temp: 98 F (36.7 C)   TempSrc: Oral   Height: 5\' 6"  (1.676 m)   Weight: 181 lb (82.101 kg)   SpO2: 100%       BP Readings from Last 3 Encounters:  04/04/14 120/70  03/14/14 129/79  11/09/13 112/74    Physical Exam: Constitutional: Vital signs reviewed.  Patient is well-developed and well-nourished in NAD and cooperative with exam.  Head: Normocephalic and atraumatic. Eyes: PERRL, EOMI, conjunctivae nl, no scleral icterus.  Neck: Supple. Cardiovascular: RRR, no MRG. Pulmonary/Chest: normal effort, non-tender to palpation, CTAB, no wheezes, rales, or rhonchi. Abdominal: Soft. NT/ND +BS. Musculoskeletal: Full range ofmotion of  right shoulder and left hip.  No pain,edema,or deformity.  Neurological: A&O x3, cranial nerves II-XII are grossly intact, moving all extremities. Extremities: 2+DP b/l; no pitting edema. Skin: Warm, dry and intact. No rash.   Assessment & Plan:   Assessment and plan was discussed and formulated with my attending.

## 2014-04-04 NOTE — Assessment & Plan Note (Addendum)
Pain is completely gone with mobic daily.  -continue mobic as needed  -follow up with PCP

## 2014-04-04 NOTE — Patient Instructions (Addendum)
Thank you for your visit today.   Please return to the internal medicine clinic in as needed.  Please make a follow-up appointment with your primary doctor.   You may take mobic as needed for pain.   Continue to take your blood pressure medication.    Your current medical regimen is effective;  continue present plan and take all medications as prescribed.    Please be sure to bring all of your medications with you to every visit; this includes herbal supplements, vitamins, eye drops, and any over-the-counter medications.   Should you have any questions regarding your medications and/or any new or worsening symptoms, please be sure to call the clinic at [phone].   If you believe that you are suffering from a life threatening condition or one that may result in the loss of limb or function, then you should call 911 or proceed to the nearest Emergency Department.     A healthy lifestyle and preventative care can promote health and wellness.   Maintain regular health, dental, and eye exams.  Eat a healthy diet. Foods like vegetables, fruits, whole grains, low-fat dairy products, and lean protein foods contain the nutrients you need without too many calories. Decrease your intake of foods high in solid fats, added sugars, and salt. Get information about a proper diet from your caregiver, if necessary.  Regular physical exercise is one of the most important things you can do for your health. Most adults should get at least 150 minutes of moderate-intensity exercise (any activity that increases your heart rate and causes you to sweat) each week. In addition, most adults need muscle-strengthening exercises on 2 or more days a week.   Maintain a healthy weight. The body mass index (BMI) is a screening tool to identify possible weight problems. It provides an estimate of body fat based on height and weight. Your caregiver can help determine your BMI, and can help you achieve or maintain a  healthy weight. For adults 20 years and older:  A BMI below 18.5 is considered underweight.  A BMI of 18.5 to 24.9 is normal.  A BMI of 25 to 29.9 is considered overweight.  A BMI of 30 and above is considered obese.

## 2014-04-04 NOTE — Assessment & Plan Note (Signed)
Pain is completely gone with mobic once daily. -continue mobic as needed for pain -follow up with PCP

## 2014-04-04 NOTE — Assessment & Plan Note (Addendum)
BP mildly elevated today-->will recheck was 120/70.  -continue meds of lisinopril 40mg  and amlodipine 10mg  daily

## 2014-04-04 NOTE — Assessment & Plan Note (Addendum)
-  declined influenza vaccine today

## 2014-04-08 NOTE — Progress Notes (Signed)
Internal Medicine Clinic Attending  Case discussed with Dr. Gill soon after the resident saw the patient.  We reviewed the resident's history and exam and pertinent patient test results.  I agree with the assessment, diagnosis, and plan of care documented in the resident's note.  

## 2014-06-18 ENCOUNTER — Other Ambulatory Visit: Payer: Self-pay | Admitting: *Deleted

## 2014-06-18 ENCOUNTER — Telehealth: Payer: Self-pay | Admitting: *Deleted

## 2014-06-18 DIAGNOSIS — I10 Essential (primary) hypertension: Secondary | ICD-10-CM

## 2014-06-18 MED ORDER — ATENOLOL 100 MG PO TABS: 100.0000 mg | ORAL_TABLET | Freq: Every day | ORAL | Status: DC

## 2014-06-18 MED ORDER — PRAVASTATIN SODIUM 40 MG PO TABS: 40.0000 mg | ORAL_TABLET | Freq: Every day | ORAL | Status: DC

## 2014-06-18 NOTE — Telephone Encounter (Signed)
Needs refill on Rx for Atenolol to [facility]. [name] RN 06/18/14 2:30PM

## 2014-07-16 ENCOUNTER — Other Ambulatory Visit: Payer: Self-pay | Admitting: Internal Medicine

## 2014-09-18 ENCOUNTER — Other Ambulatory Visit: Payer: Self-pay | Admitting: *Deleted

## 2014-09-18 DIAGNOSIS — I10 Essential (primary) hypertension: Secondary | ICD-10-CM

## 2014-09-19 MED ORDER — AMLODIPINE BESYLATE 10 MG PO TABS: 5.0000 mg | ORAL_TABLET | Freq: Every day | ORAL | Status: DC

## 2014-11-07 ENCOUNTER — Emergency Department (HOSPITAL_COMMUNITY)
Admission: EM | Admit: 2014-11-07 | Discharge: 2014-11-07 | Disposition: A | Discharge status: Home / Self Care | Payer: 59 | Attending: Emergency Medicine | Admitting: Emergency Medicine

## 2014-11-07 DIAGNOSIS — R011 Cardiac murmur, unspecified: Secondary | ICD-10-CM | POA: Diagnosis not present

## 2014-11-07 DIAGNOSIS — Z720 Tobacco use: Secondary | ICD-10-CM | POA: Insufficient documentation

## 2014-11-07 DIAGNOSIS — Y998 Other external cause status: Secondary | ICD-10-CM | POA: Insufficient documentation

## 2014-11-07 DIAGNOSIS — Z79899 Other long term (current) drug therapy: Secondary | ICD-10-CM | POA: Diagnosis not present

## 2014-11-07 DIAGNOSIS — Y9289 Other specified places as the place of occurrence of the external cause: Secondary | ICD-10-CM | POA: Diagnosis not present

## 2014-11-07 DIAGNOSIS — Z791 Long term (current) use of non-steroidal anti-inflammatories (NSAID): Secondary | ICD-10-CM | POA: Insufficient documentation

## 2014-11-07 DIAGNOSIS — E785 Hyperlipidemia, unspecified: Secondary | ICD-10-CM | POA: Diagnosis not present

## 2014-11-07 DIAGNOSIS — Y9389 Activity, other specified: Secondary | ICD-10-CM | POA: Insufficient documentation

## 2014-11-07 DIAGNOSIS — Z880 Allergy status to penicillin: Secondary | ICD-10-CM | POA: Diagnosis not present

## 2014-11-07 DIAGNOSIS — Z8739 Personal history of other diseases of the musculoskeletal system and connective tissue: Secondary | ICD-10-CM | POA: Insufficient documentation

## 2014-11-07 DIAGNOSIS — S6992XA Unspecified injury of left wrist, hand and finger(s), initial encounter: Secondary | ICD-10-CM | POA: Diagnosis present

## 2014-11-07 DIAGNOSIS — S61219A Laceration without foreign body of unspecified finger without damage to nail, initial encounter: Secondary | ICD-10-CM

## 2014-11-07 DIAGNOSIS — W260XXA Contact with knife, initial encounter: Secondary | ICD-10-CM | POA: Insufficient documentation

## 2014-11-07 DIAGNOSIS — S61217A Laceration without foreign body of left little finger without damage to nail, initial encounter: Secondary | ICD-10-CM | POA: Diagnosis not present

## 2014-11-07 DIAGNOSIS — I10 Essential (primary) hypertension: Secondary | ICD-10-CM | POA: Insufficient documentation

## 2014-11-07 MED ORDER — LIDOCAINE HCL (PF) 1 % IJ SOLN
5.0000 mL | Freq: Once | INTRAMUSCULAR | Status: AC
  Administered 2014-11-07: 5 mL via INTRADERMAL
  Filled 2014-11-07: qty 5

## 2014-11-07 NOTE — Discharge Instructions (Signed)
Keep wound area clean. Return to the ED in 10 days for suture removal. Refer to attached documents for more information.

## 2014-11-07 NOTE — ED Provider Notes (Signed)
CSN: [id number]     Arrival date & time 11/07/14  1002 History  This chart was scribed for non-physician practitioner, Alvina Chou, PA-C, working with Alfonzo Beers, MD, by Ian Bushman, ED Scribe. This patient was seen in room TR08C/TR08C and the patient's care was started at 11:07 AM.   First MD Initiated Contact with Patient 11/07/14 1034     Chief Complaint  Patient presents with  . Finger Injury     (Consider location/radiation/quality/duration/timing/severity/associated sxs/prior Treatment) The history is provided by the patient. No language interpreter was used.    HPI Comments: Lauren [name] is a 49 y.o. female who presents to the Emergency Department for a finger injury on her left pinky which happened this morning when she was using a butcher knife.  She has no other concerns today.    Past Medical History  Diagnosis Date  . Hypertension   . HLD (hyperlipidemia) 2008  . Headache(784.0) 2008     improving  . Shoulder pain, right 07/2008  . Allergy     seasonal  . Heart murmur     History of Heart murmur as child.   No past surgical history on file. Family History  Problem Relation Age of Onset  . Heart disease Mother    History  Substance Use Topics  . Smoking status: Current Every Day Smoker -- 0.50 packs/day for 30 years    Types: Cigarettes  . Smokeless tobacco: Not on file     Comment: Cutting back  . Alcohol Use: No   OB History    No data available     Review of Systems  Constitutional: Negative for fever and chills.  Gastrointestinal: Negative for nausea and vomiting.  Musculoskeletal: Negative for neck pain and neck stiffness.  Skin: Positive for wound.  All other systems reviewed and are negative.     Allergies  Penicillins  Home Medications   Prior to Admission medications   Medication Sig Start Date End Date Taking? Authorizing Provider  amLODipine (NORVASC) 10 MG tablet Take 0.5 tablets (5 mg total) by mouth daily. 09/19/14  09/18/15 Yes Alexa Sherral Hammers, MD  atenolol (TENORMIN) 100 MG tablet Take 1 tablet (100 mg total) by mouth daily. 06/18/14 06/18/15 Yes Alexa Sherral Hammers, MD  lisinopril (PRINIVIL,ZESTRIL) 40 MG tablet TAKE ONE TABLET BY MOUTH ONCE DAILY 07/16/14  Yes Alexa Sherral Hammers, MD  meloxicam (MOBIC) 7.5 MG tablet Take 1 tablet (7.5 mg total) by mouth daily. 03/14/14 03/14/15 Yes Jessee Avers, MD  pravastatin (PRAVACHOL) 40 MG tablet Take 1 tablet (40 mg total) by mouth daily. 06/18/14  Yes Alexa Sherral Hammers, MD   BP 131/78 mmHg  Pulse 67  Temp(Src) 98.5 F (36.9 C) (Oral)  Resp 18  Ht 5\' 6"  (1.676 m)  Wt 183 lb (83.008 kg)  BMI 29.55 kg/m2  SpO2 99%  LMP 08/09/2012 Physical Exam  Constitutional: She is oriented to person, place, and time. She appears well-developed and well-nourished. No distress.  HENT:  Head: Normocephalic and atraumatic.  Eyes: Conjunctivae and EOM are normal.  Neck: Neck supple.  Cardiovascular: Normal rate.   Pulmonary/Chest: Effort normal. No respiratory distress.  Musculoskeletal: Normal range of motion.  Neurological: She is alert and oriented to person, place, and time.  Skin: Skin is warm and dry.  1.5 cm laceration to left 5th finger pad.   Psychiatric: She has a normal mood and affect. Her behavior is normal.  Nursing note and vitals reviewed.   ED Course  Procedures (including  critical care time) DIAGNOSTIC STUDIES: Oxygen Saturation is 99% on RA,  normal by my interpretation.    COORDINATION OF CARE: 11:08 AM Discussed treatment plan with patient at beside, the patient agrees with the plan and has no further questions at this time.  LACERATION REPAIR Performed by: Alvina Chou Authorized by: Alvina Chou Consent: Verbal consent obtained. Risks and benefits: risks, benefits and alternatives were discussed Consent given by: patient Patient identity confirmed: provided demographic data Prepped and Draped in normal sterile fashion Wound  explored  Laceration Location: left fifth fingerpad  Laceration Length: 1.5cm  No Foreign Bodies seen or palpated  Anesthesia: local infiltration  Local anesthetic: lidocaine 1% without epinephrine  Anesthetic total: 1 ml  Irrigation method: syringe Amount of cleaning: standard  Skin closure: 5-0 prolene  Number of sutures: 3  Technique: simple  Patient tolerance: Patient tolerated the procedure well with no immediate complications.   Labs Review Labs Reviewed - No data to display  Imaging Review No results found.   EKG Interpretation None      MDM   Final diagnoses:  Finger laceration, initial encounter   11:44 AM Laceration repaired without difficulty. Patient instructed to return to the ED in 10 days for suture removal.   I personally performed the services described in this documentation, which was scribed in my presence. The recorded information has been reviewed and is accurate.    Alvina Chou, PA-C 11/07/14 [place], MD 11/07/14 1151

## 2015-03-13 ENCOUNTER — Other Ambulatory Visit: Payer: Self-pay

## 2015-03-13 DIAGNOSIS — Z1231 Encounter for screening mammogram for malignant neoplasm of breast: Secondary | ICD-10-CM

## 2015-03-19 ENCOUNTER — Ambulatory Visit (INDEPENDENT_AMBULATORY_CARE_PROVIDER_SITE_OTHER): Payer: 59 | Admitting: Internal Medicine

## 2015-03-19 ENCOUNTER — Encounter: Payer: Self-pay | Admitting: Internal Medicine

## 2015-03-19 VITALS — BP 146/75 | HR 68 | Temp 98.0°F | Ht 66.0 in | Wt 178.7 lb

## 2015-03-19 DIAGNOSIS — Z0000 Encounter for general adult medical examination without abnormal findings: Secondary | ICD-10-CM

## 2015-03-19 DIAGNOSIS — I10 Essential (primary) hypertension: Secondary | ICD-10-CM | POA: Diagnosis not present

## 2015-03-19 DIAGNOSIS — M546 Pain in thoracic spine: Secondary | ICD-10-CM

## 2015-03-19 DIAGNOSIS — K5900 Constipation, unspecified: Secondary | ICD-10-CM

## 2015-03-19 DIAGNOSIS — G44219 Episodic tension-type headache, not intractable: Secondary | ICD-10-CM

## 2015-03-19 DIAGNOSIS — R51 Headache: Secondary | ICD-10-CM | POA: Diagnosis not present

## 2015-03-19 DIAGNOSIS — M549 Dorsalgia, unspecified: Secondary | ICD-10-CM

## 2015-03-19 DIAGNOSIS — K5901 Slow transit constipation: Secondary | ICD-10-CM

## 2015-03-19 DIAGNOSIS — Z1211 Encounter for screening for malignant neoplasm of colon: Secondary | ICD-10-CM | POA: Insufficient documentation

## 2015-03-19 HISTORY — DX: Constipation, unspecified: K59.00

## 2015-03-19 MED ORDER — POLYETHYLENE GLYCOL 3350 17 GM/SCOOP PO POWD: 17.0000 g | Freq: Once | ORAL | Status: DC

## 2015-03-19 NOTE — Progress Notes (Signed)
Subjective:    Patient ID: Lauren [name], female    DOB: 25-Apr-1966, 49 y.o.   MRN: [id number]  HPI Lauren [name] is a 49 y.o. female with PMHx of HTN, HLD, chronic pain, allergies and tobacco abuse who presents to the clinic with complaint of headaches, constipation and right breast pain. Please see A&P for the status of the patient's chronic medical problems.   Patient reports a history of chronic headaches. She has been compliant with atenolol 100 mg daily for ppx. Previously, her headaches were only controlled with excedrin migraine since naproxen and advil didn't work for her. She reports headaches for the last weeks, pressure like, frontal, with occasional photophobia and phonophobia. She denies any nausea or vomiting. Patient hasn't tried any Excedrin migraine yet.   Patient complains of several weeks of increased constipation. She normally only goes 2x/week, but has only been going 1x/week and reports hard stool. No abdominal pain, nausea, vomiting, or blood in her stools. She has not tried anything for her symptoms.   Patient complains of right sided trunk pain, inferior to axilla, near ribs 4-6 for the past 1-2 weeks. It comes and goes. Worse with movement and better with rest. She cannot correlate it to any particular activity although she is very active at work. It does not currently hurt. No urinary symptoms, nausea, vomiting or abdominal pain.   Past Medical History  Diagnosis Date  . Hypertension   . HLD (hyperlipidemia) 2008  . Headache(784.0) 2008     improving  . Shoulder pain, right 07/2008  . Allergy     seasonal  . Heart murmur     History of Heart murmur as child.    Outpatient Encounter Prescriptions as of 03/19/2015  Medication Sig  . amLODipine (NORVASC) 10 MG tablet Take 0.5 tablets (5 mg total) by mouth daily.  Marland Kitchen atenolol (TENORMIN) 100 MG tablet Take 1 tablet (100 mg total) by mouth daily.  Marland Kitchen lisinopril (PRINIVIL,ZESTRIL) 40 MG tablet TAKE ONE TABLET BY  MOUTH ONCE DAILY  . polyethylene glycol powder (GLYCOLAX/MIRALAX) powder Take 17 g by mouth once.  . pravastatin (PRAVACHOL) 40 MG tablet Take 1 tablet (40 mg total) by mouth daily.   No facility-administered encounter medications on file as of 03/19/2015.    Family History  Problem Relation Age of Onset  . Heart disease Mother     Social History   Social History  . Marital Status: Single    Spouse Name: N/A  . Number of Children: N/A  . Years of Education: N/A   Occupational History  . Not on file.   Social History Main Topics  . Smoking status: Current Every Day Smoker -- 0.50 packs/day for 30 years    Types: Cigarettes  . Smokeless tobacco: Not on file     Comment: Cutting back - less than 1/2 pack.  . Alcohol Use: No  . Drug Use: No  . Sexual Activity: Not on file   Other Topics Concern  . Not on file   Social History Narrative   Works as Investment banker, corporate at [employer], lives with significant other in [place]   No EToh, former beer drinker   No illicit drug use.   Current smoker, 1/2 PPDx 26 years   Review of Systems General: Denies fever, chills, fatigue, change in appetite Respiratory: Denies SOB, cough, DOE   Cardiovascular: Denies chest pain and palpitations.  Gastrointestinal: Admits to constipation. Denies nausea, vomiting, abdominal pain, diarrhea, blood  in stool and abdominal distention.  Genitourinary: Denies dysuria, urgency, frequency Musculoskeletal: Admits to myalgias. Denies back pain, joint swelling, arthralgias Skin: Denies pallor, rash and wounds.  Neurological: Admits to headaches. Denies dizziness, weakness, lightheadedness     Objective:   Physical Exam Filed Vitals:   03/19/15 1355  BP: 146/75  Pulse: 68  Temp: 98 F (36.7 C)  TempSrc: Oral  Height: 5\' 6"  (1.676 m)  Weight: 178 lb 11.2 oz (81.058 kg)  SpO2: 100%   General: Vital signs reviewed.  Patient is well-developed and well-nourished, in no acute distress and  cooperative with exam.  Cardiovascular: RRR, S1 normal, S2 normal, no murmurs, gallops, or rubs. Pulmonary/Chest: Clear to auscultation bilaterally, no wheezes, rales, or rhonchi. Abdominal: Soft, non-tender, non-distended, BS +, no masses, organomegaly, or guarding present.  Musculoskeletal: No tenderness on palpation of right lateral trunk. Extremities: No lower extremity edema bilaterally Skin: Warm, dry and intact. No rashes or erythema. Psychiatric: Normal mood and affect. speech and behavior is normal. Cognition and memory are normal.      Assessment & Plan:   Please see problem based assessment and plan.

## 2015-03-19 NOTE — Assessment & Plan Note (Signed)
Patient reports a history of chronic headaches. She has been compliant with atenolol 100 mg daily for ppx. Previously, her headaches were only controlled with excedrin migraine since naproxen and advil didn't work for her. She reports headaches for the last weeks, pressure like, frontal, with occasional photophobia and phonophobia. She denies any nausea or vomiting. Patient hasn't tried any Excedrin migraine yet.   Plan: -Excedrin migraine prn

## 2015-03-19 NOTE — Patient Instructions (Signed)
Continue trying to cut back on smoking.  Take Excedrin as needed for migraine headaches.   Take Miralax about 17 grams a day as needed for constipation.   Follow up one year.

## 2015-03-19 NOTE — Progress Notes (Signed)
Medicine attending: Medical history, presenting problems, physical findings, and medications, reviewed with Dr Alexa Richardson and I concur with her evaluation and management plan. 

## 2015-03-19 NOTE — Assessment & Plan Note (Addendum)
Patient complains of right sided trunk pain, inferior to axilla, near ribs 4-6 for the past 1-2 weeks. It comes and goes. Worse with movement and better with rest. She cannot correlate it to any particular activity although she is very active at work. It does not currently hurt. No urinary symptoms, nausea, vomiting or abdominal pain. No evidence of rash, skin changes or trauma. Likely musculoskeletal in nature.  Plan: -Conservative management -NSAIDS prn, heat/ice as needed

## 2015-03-19 NOTE — Assessment & Plan Note (Signed)
BP Readings from Last 3 Encounters:  03/19/15 146/75  11/07/14 167/87  04/04/14 120/70    Lab Results  Component Value Date   NA 140 06/01/2013   K 4.4 06/01/2013   CREATININE 0.76 06/01/2013    Assessment: Blood pressure control:  Just above goal Progress toward BP goal:   Improved Comments: Compliant with amlodipine 10 mg daily, atenolol 100 mg daily and lisinopril 40 mg daily  Plan: Medications:  continue current medications

## 2015-03-19 NOTE — Assessment & Plan Note (Signed)
HIV: Screen complete 10/5 Mammogram: Appt on 10/13 Colonoscopy: Due 4/17 Pap: Due 3/18 Flu shot: Refused

## 2015-03-19 NOTE — Assessment & Plan Note (Signed)
Patient complains of several weeks of increased constipation. She normally only goes 2x/week, but has only been going 1x/week and reports hard stool. No abdominal pain, nausea, vomiting, or blood in her stools. She has not tried anything for her symptoms.   Plan: -Miralax QD prn

## 2015-03-20 LAB — CBC
HEMATOCRIT: 39.2 % (ref 34.0–46.6)
Hemoglobin: 12.8 g/dL (ref 11.1–15.9)
MCH: 29.6 pg (ref 26.6–33.0)
MCHC: 32.7 g/dL (ref 31.5–35.7)
MCV: 91 fL (ref 79–97)
Platelets: 300 10*3/uL (ref 150–379)
RBC: 4.32 x10E6/uL (ref 3.77–5.28)
RDW: 13.4 % (ref 12.3–15.4)
WBC: 10.5 10*3/uL (ref 3.4–10.8)

## 2015-03-20 LAB — BMP8+ANION GAP
ANION GAP: 19 mmol/L — AB (ref 10.0–18.0)
BUN / CREAT RATIO: 19 (ref 9–23)
BUN: 13 mg/dL (ref 6–24)
CHLORIDE: 103 mmol/L (ref 97–108)
CO2: 24 mmol/L (ref 18–29)
CREATININE: 0.67 mg/dL (ref 0.57–1.00)
Calcium: 9.4 mg/dL (ref 8.7–10.2)
GFR calc Af Amer: 119 mL/min/{1.73_m2} (ref >59)
GFR calc non Af Amer: 104 mL/min/{1.73_m2} (ref >59)
GLUCOSE: 99 mg/dL (ref 65–99)
Potassium: 4 mmol/L (ref 3.5–5.2)
Sodium: 146 mmol/L — ABNORMAL HIGH (ref 134–144)

## 2015-03-20 LAB — HIV ANTIBODY (ROUTINE TESTING W REFLEX): HIV SCREEN 4TH GENERATION: NONREACTIVE

## 2015-03-27 ENCOUNTER — Ambulatory Visit
Admission: RE | Admit: 2015-03-27 | Discharge: 2015-03-27 | Disposition: A | Discharge status: Home / Self Care | Payer: 59 | Source: Ambulatory Visit

## 2015-03-27 DIAGNOSIS — Z1231 Encounter for screening mammogram for malignant neoplasm of breast: Secondary | ICD-10-CM

## 2015-06-12 ENCOUNTER — Other Ambulatory Visit: Payer: Self-pay | Admitting: Internal Medicine

## 2015-06-25 ENCOUNTER — Other Ambulatory Visit: Payer: Self-pay | Admitting: Internal Medicine

## 2015-07-02 ENCOUNTER — Other Ambulatory Visit: Payer: Self-pay | Admitting: Internal Medicine

## 2015-07-02 DIAGNOSIS — I10 Essential (primary) hypertension: Secondary | ICD-10-CM

## 2015-07-02 MED ORDER — LISINOPRIL 40 MG PO TABS: 40.0000 mg | ORAL_TABLET | Freq: Every day | ORAL | Status: DC

## 2015-07-02 MED ORDER — AMLODIPINE BESYLATE 10 MG PO TABS: 10.0000 mg | ORAL_TABLET | Freq: Every day | ORAL | Status: DC

## 2015-07-02 NOTE — Telephone Encounter (Signed)
Pt requesing amlodipine and lisinopril to be filled @ [facility] on [address].

## 2015-09-10 ENCOUNTER — Ambulatory Visit (HOSPITAL_COMMUNITY): Payer: BLUE CROSS/BLUE SHIELD | Attending: Internal Medicine

## 2015-09-10 ENCOUNTER — Encounter: Payer: Self-pay | Admitting: Internal Medicine

## 2015-09-10 ENCOUNTER — Ambulatory Visit (INDEPENDENT_AMBULATORY_CARE_PROVIDER_SITE_OTHER): Payer: BLUE CROSS/BLUE SHIELD | Admitting: Internal Medicine

## 2015-09-10 VITALS — BP 142/81 | HR 84 | Ht 66.0 in | Wt 172.0 lb

## 2015-09-10 DIAGNOSIS — I498 Other specified cardiac arrhythmias: Secondary | ICD-10-CM | POA: Insufficient documentation

## 2015-09-10 DIAGNOSIS — R002 Palpitations: Secondary | ICD-10-CM | POA: Insufficient documentation

## 2015-09-10 DIAGNOSIS — I471 Supraventricular tachycardia, unspecified: Secondary | ICD-10-CM | POA: Insufficient documentation

## 2015-09-10 NOTE — Progress Notes (Signed)
Patient ID: Lauren [name], female   DOB: April 16, 1966, 50 y.o.   MRN: [id number]    Subjective:   Patient ID: Lauren [name] female   DOB: [birth year] 50 y.o.   MRN: [id number]  HPI: Lauren [name] is a 50 y.o. female with PMH as below, here for evaluation of irregular heart rhythm.  Please see Problem-Based charting for the status of the patient's chronic medical issues.     Past Medical History  Diagnosis Date  . Hypertension   . HLD (hyperlipidemia) 2008  . Headache(784.0) 2008     improving  . Shoulder pain, right 07/2008  . Allergy     seasonal  . Heart murmur     History of Heart murmur as child.   Current Outpatient Prescriptions  Medication Sig Dispense Refill  . amLODipine (NORVASC) 10 MG tablet Take 1 tablet (10 mg total) by mouth daily. 90 tablet 3  . atenolol (TENORMIN) 100 MG tablet Take 1 tablet (100 mg total) by mouth daily. 90 tablet 4  . lisinopril (PRINIVIL,ZESTRIL) 40 MG tablet Take 1 tablet (40 mg total) by mouth daily. 90 tablet 3  . polyethylene glycol powder (GLYCOLAX/MIRALAX) powder Take 17 g by mouth once. 255 g 2  . pravastatin (PRAVACHOL) 40 MG tablet Take 1 tablet (40 mg total) by mouth daily. 30 tablet 11   No current facility-administered medications for this visit.   Family History  Problem Relation Age of Onset  . Heart disease Mother    Social History   Social History  . Marital Status: Single    Spouse Name: N/A  . Number of Children: N/A  . Years of Education: N/A   Social History Main Topics  . Smoking status: Current Every Day Smoker -- 0.50 packs/day for 30 years    Types: Cigarettes  . Smokeless tobacco: None     Comment: Cutting back - less than 1/2 pack.  . Alcohol Use: No  . Drug Use: No  . Sexual Activity: Not Asked   Other Topics Concern  . 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   Current smoker, 1/2 PPDx 26 years   Review of Systems: She denies chest pain, SOB, or N/V.  She has had some sweating 2/2 menopause, hair thinning, constipation, and weight loss (5-6 lbs in 6 months). Objective:  Physical Exam: Filed Vitals:   09/10/15 0823  BP: 142/81  Pulse: 84  Height: 5\' 6"  (1.676 m)  Weight: 172 lb (78.019 kg)  SpO2: 100%   Physical Exam  Constitutional: She is oriented to person, place, and time and well-developed, well-nourished, and in no distress. No distress.  HENT:  Head: Normocephalic and atraumatic.  Eyes: EOM are normal. No scleral icterus.  Neck: No tracheal deviation present.  Cardiovascular: Normal rate and regular rhythm.   Regular rhythm with premature contractions.  Grade II/VI systolic murmur heard best at RUSB.  Pulmonary/Chest: Effort normal. No stridor. No respiratory distress. She has no wheezes.  Neurological: She is alert and oriented to person, place, and time.  Skin: She is not diaphoretic.     Assessment & Plan:   Patient and case were discussed with Dr. Lynnae January.  Please refer to Problem Based charting for further documentation.

## 2015-09-10 NOTE — Assessment & Plan Note (Signed)
Patient reports one month of irregular heart rhythm, which started after she was rushing to see her aunt before she died.  She reports "fluttering" of her heart and it feeling "crazy."  It is not associated with CP, SOB, or diaphoresis.  She does sweat, but it is 2/2 menopause.  It can happen anytime of day.  She has no personal h/o MI, stroke, afib, aflutter, or thyroid disease.   She has had some hair thinning, dry skin, constipation, and weight loss (5-6 lbs in 6 months).  She has a family h/o MI in her mother, MGM, and maternal aunts.  She drinks >1L of [employer] daily, including 4-5 sodas at work daily.  ECG shows NSR with PACs.  A/P: NSR with PACs.  No evidence of afib or aflutter.  No need for anticoagulation.  Likely underlying cause is caffeine and stress.  Patient endorses understanding and will cut down on caffeine intake.  Will reassess for resolution of palpitations at f/u. - Decrease caffeine - RTC 1 month

## 2015-09-10 NOTE — Patient Instructions (Signed)
1. Decrease your caffeine intake to less than 200 mg per day. 2. Return to clinic in 1 month.  Premature Atrial Contraction Premature atrial contractions (PACs) happen when your heart beats before it has had time to fill with blood. Your heart then has to pause until it can fill with blood for the next beat. This causes the next beat to be more forceful. PACs are also called skipped heartbeats because it may feel like your heart stops for a second.  Your heart has four chambers. There are two upper chambers (atria) and two lower chambers (ventricles). All the chambers need to work together to pump blood properly. Electrical signals spread across your heart and make all the chambers beat together.The signal to beat starts in your atria. If the atria fire a bit early, you may have a PAC.  CAUSES  The cause of a PAC is often unknown. PACs are sometimes caused by heart disease or injury. RISK FACTORS PACs are more common in children and older people. Other risk factors that may trigger PACs include:  Caffeine.  Stress.  Fatigue.  Alcohol.  Smoking.  Stimulant drugs. These may be prescription or illegal drugs.  Heart disease. SIGNS AND SYMPTOMS PACs are very common, especially in children and people 50 years and older. PACs do not cause dizziness, shortness of breath, or chest pain. The only symptom of a PAC is the sensation of a skipped or fluttering heartbeat.  DIAGNOSIS  Your health care provider can diagnose PAC based on the description of your symptom. Your health care provider may also:  Perform a physical exam to listen to your heart. Your heart may sound normal during this exam.  Perform tests to rule out other conditions. These tests may include an electrical tracing of your heart called electrocardiogram (ECG). You may need to wear a portable ECG machine (Holter monitor) that records your heart for 24 hours or more. TREATMENT  In most cases, PACs do not need to be treated. If  you have frequent PACs that are caused by heart disease, you may be treated for the underlying condition. HOME CARE INSTRUCTIONS  Do not use any tobacco products, including cigarettes, chewing tobacco, or electronic cigarettes. If you need help quitting, ask your health care provider.  Limit alcohol intake to no more than 1 drink per day for nonpregnant women and 2 drinks per day for men. One drink equals 12 ounces of beer, 5 ounces of wine, or 1 ounces of hard liquor.  Limit the amount of caffeine you take in.  Do not use illegal drugs.  Get at least 8 hours of sleep every night.  Find healthy ways to manage stress.  Get regular exercise. Ask your health care provider to suggest some activities that are safe for you. SEEK MEDICAL CARE IF:  You feel your heart skipping beats often (more than once a day).  Your heart skips beats and you feel dizzy, lightheaded, or very tired. SEEK IMMEDIATE MEDICAL CARE IF:   You have chest pain.  You have trouble breathing.   This information is not intended to replace advice given to you by your health care provider. Make sure you discuss any questions you have with your health care provider.   Document Released: 02/01/2014 Document Revised: 10/15/2014 Document Reviewed: 02/01/2014 Elsevier Interactive Patient Education [employer].

## 2015-09-11 LAB — TSH: TSH: 0.711 u[IU]/mL (ref 0.450–4.500)

## 2015-09-11 LAB — T4, FREE: Free T4: 1.11 ng/dL (ref 0.82–1.77)

## 2015-09-11 NOTE — Progress Notes (Signed)
Internal Medicine Clinic Attending  Case discussed with Dr. Taylor at the time of the visit.  We reviewed the resident's history and exam and pertinent patient test results.  I agree with the assessment, diagnosis, and plan of care documented in the resident's note. 

## 2015-09-12 ENCOUNTER — Other Ambulatory Visit: Payer: Self-pay | Admitting: Internal Medicine

## 2015-09-12 DIAGNOSIS — I10 Essential (primary) hypertension: Secondary | ICD-10-CM

## 2015-09-12 NOTE — Telephone Encounter (Signed)
Pt requesting atenolol to be filled @ [facility] on [address].

## 2015-09-13 MED ORDER — ATENOLOL 100 MG PO TABS
100.0000 mg | ORAL_TABLET | Freq: Every day | ORAL | Status: DC
Start: 2015-09-13 — End: 2016-03-12

## 2015-09-14 ENCOUNTER — Encounter: Payer: Self-pay | Admitting: Internal Medicine

## 2015-10-08 ENCOUNTER — Ambulatory Visit (INDEPENDENT_AMBULATORY_CARE_PROVIDER_SITE_OTHER): Payer: BLUE CROSS/BLUE SHIELD | Admitting: Internal Medicine

## 2015-10-08 VITALS — BP 129/76 | HR 72 | Temp 98.3°F | Ht 66.0 in | Wt 169.5 lb

## 2015-10-08 DIAGNOSIS — K5901 Slow transit constipation: Secondary | ICD-10-CM

## 2015-10-08 DIAGNOSIS — K5900 Constipation, unspecified: Secondary | ICD-10-CM | POA: Diagnosis not present

## 2015-10-08 DIAGNOSIS — M542 Cervicalgia: Secondary | ICD-10-CM

## 2015-10-08 DIAGNOSIS — I10 Essential (primary) hypertension: Secondary | ICD-10-CM

## 2015-10-08 DIAGNOSIS — R002 Palpitations: Secondary | ICD-10-CM

## 2015-10-08 DIAGNOSIS — M25512 Pain in left shoulder: Secondary | ICD-10-CM

## 2015-10-08 DIAGNOSIS — E785 Hyperlipidemia, unspecified: Secondary | ICD-10-CM

## 2015-10-08 DIAGNOSIS — Z1211 Encounter for screening for malignant neoplasm of colon: Secondary | ICD-10-CM | POA: Insufficient documentation

## 2015-10-08 DIAGNOSIS — F17210 Nicotine dependence, cigarettes, uncomplicated: Secondary | ICD-10-CM

## 2015-10-08 DIAGNOSIS — M62838 Other muscle spasm: Secondary | ICD-10-CM

## 2015-10-08 MED ORDER — NAPROXEN SODIUM 275 MG PO TABS: 550.0000 mg | ORAL_TABLET | Freq: Two times a day (BID) | ORAL | Status: DC

## 2015-10-08 MED ORDER — CYCLOBENZAPRINE HCL 5 MG PO TABS: 5.0000 mg | ORAL_TABLET | Freq: Three times a day (TID) | ORAL | Status: DC | PRN

## 2015-10-08 NOTE — Assessment & Plan Note (Addendum)
Patient was seen one month ago with complaint of palpitations during a stressful event that was not associated with chest pain, shortness of breath, diaphoresis. EKG at that time was NSR with PACs. TSH and Free T4 were normal. Her palpitations were thought to be secondary to caffeine and stress. She presents today for follow up. She denies any recurrent palpitations, chest pain, SOB or DOE. She only drinks caffeine free beverages now. She is RRR on exam.  Plan: -Continue to monitor -Avoid caffeine

## 2015-10-08 NOTE — Assessment & Plan Note (Signed)
Compliant with pravastatin 40 mg daily.   Plan: -Continue current regimen -Repeat lipid panel

## 2015-10-08 NOTE — Patient Instructions (Addendum)
PLEASE COMPLETE STOOL CARDS  FOR YOUR NECK PAIN (TRAPEZIUS MUSCLE SPASM/STRAIN): USE HEAT, STRETCHES, NAPROXEN 2 PILLS TWICE A DAY FOR 5 DAYS, AND FLEXERIL 5 MG AS NEEDED. THE FLEXERIL CAN MAKE YOU FATIGUED!  CONTINUE ALL OTHER MEDICATIONS AS PRESCRIBED.   FOLLOW UP IN 6 MONTHS.

## 2015-10-08 NOTE — Progress Notes (Signed)
Subjective:    Patient ID: Lauren [name], female    DOB: 01-Jan-1966, 50 y.o.   MRN: [id number]  HPI Lauren [name] is a 50 y.o. female with PMHx of HTN, HLD, tobacco abuse who presents to the clinic for follow up for HTN and palpitations. Please see A&P for the status of the patient's chronic medical problems.   Past Medical History  Diagnosis Date  . Hypertension   . HLD (hyperlipidemia) 2008  . Headache(784.0) 2008     improving  . Shoulder pain, right 07/2008  . Allergy     seasonal  . Heart murmur     History of Heart murmur as child.    Outpatient Encounter Prescriptions as of 10/08/2015  Medication Sig  . amLODipine (NORVASC) 10 MG tablet Take 1 tablet (10 mg total) by mouth daily.  Marland Kitchen atenolol (TENORMIN) 100 MG tablet Take 1 tablet (100 mg total) by mouth daily.  . cyclobenzaprine (FLEXERIL) 5 MG tablet Take 1 tablet (5 mg total) by mouth 3 (three) times daily as needed for muscle spasms.  Marland Kitchen lisinopril (PRINIVIL,ZESTRIL) 40 MG tablet Take 1 tablet (40 mg total) by mouth daily.  . naproxen sodium (ANAPROX) 275 MG tablet Take 2 tablets (550 mg total) by mouth 2 (two) times daily with a meal.  . polyethylene glycol powder (GLYCOLAX/MIRALAX) powder Take 17 g by mouth once.  . pravastatin (PRAVACHOL) 40 MG tablet Take 1 tablet (40 mg total) by mouth daily.   No facility-administered encounter medications on file as of 10/08/2015.    Family History  Problem Relation Age of Onset  . Heart disease Mother     Social History   Social History  . Marital Status: Single    Spouse Name: N/A  . Number of Children: N/A  . Years of Education: N/A   Occupational History  . Not on file.   Social History Main Topics  . Smoking status: Current Every Day Smoker -- 0.50 packs/day for 30 years    Types: Cigarettes  . Smokeless tobacco: Not on file     Comment: Cutting back - less than 1/2 pack.  . Alcohol Use: No  . Drug Use: No  . Sexual Activity: Not on file   Other  Topics Concern  . Not on file   Social History Narrative   Works as Investment banker, corporate at [employer], lives with significant other in [place]   No EToh, former beer drinker   No illicit drug use.   Current smoker, 1/2 PPDx 26 years   Review of Systems General: Denies fever, chills, fatigue, change in appetite and diaphoresis.  Respiratory: Denies SOB, cough, DOE.   Cardiovascular: Denies chest pain and palpitations.  Gastrointestinal: Denies abdominal pain, diarrhea, constipation, blood in stool Musculoskeletal: Admits to left shoulder, and neck pain. Denies back pain Skin: Denies rash and wounds.  Neurological: Denies dizziness, headaches, weakness, lightheadedness, numbness     Objective:   Physical Exam Filed Vitals:   10/08/15 1440  BP: 129/76  Pulse: 72  Temp: 98.3 F (36.8 C)  TempSrc: Oral  Height: 5\' 6"  (1.676 m)  Weight: 169 lb 8 oz (76.885 kg)  SpO2: 100%   General: Vital signs reviewed.  Patient is well-developed and well-nourished, in no acute distress and cooperative with exam.   Cardiovascular: RRR, S1 normal, S2 normal, no murmurs, gallops, or rubs. Pulmonary/Chest: Clear to auscultation bilaterally, no wheezes, rales, or rhonchi. Abdominal: Soft, non-tender, non-distended, BS + Extremities: No lower  extremity edema bilaterally MSK: Tenderpoints throughout left trapezius muscle, decreased ROM in neck.  Neurological: A&O x3, Strength is normal and symmetric bilaterally Skin: Warm, dry and intact.  Psychiatric: Normal mood and affect. speech and behavior is normal. Cognition and memory are normal.      Assessment & Plan:   Please see problem based assessment and plan.

## 2015-10-08 NOTE — Assessment & Plan Note (Signed)
Patient presents with a 2 week history of left neck pain which radiates into her left shoulder. She denies any rash, falls, injury or numbness or weakness in left arm. She has not tried anything for the pain. On physical exam she has obvious decreased ROM in her neck and tenderpoints throughout her left trapezius muscle.   Assessment: Left Trapezius strain versus spasm  Plan: -Flexeril 5 mg TID prn #15 -Naproxen 550 mg BID for 5 days -Stretch and heat

## 2015-10-08 NOTE — Assessment & Plan Note (Signed)
  Assessment: Progress toward smoking cessation:   Stagnant, no change Barriers to progress toward smoking cessation:   Desire, stress at work Comments: Smoking 1/2 ppd  Plan: Instruction/counseling given:  I counseled patient on the dangers of tobacco use, advised patient to stop smoking, and reviewed strategies to maximize success. Educational resources provided:    Self management tools provided:    Medications to assist with smoking cessation:  None Patient agreed to the following self-care plans for smoking cessation: cut down the number of cigarettes smoked  Other plans: Not ready to quit

## 2015-10-08 NOTE — Assessment & Plan Note (Addendum)
BP Readings from Last 3 Encounters:  10/08/15 129/76  09/10/15 142/81  03/19/15 146/75    Lab Results  Component Value Date   NA 146* 03/19/2015   K 4.0 03/19/2015   CREATININE 0.67 03/19/2015    Assessment: Blood pressure control:  Controlled Progress toward BP goal:   At goal Comments: Compliant with amlodipine 10 mg daily, atenolol 100 mg daily, and lisinopril 40 mg daily.   Plan: Medications:  continue current medications

## 2015-10-08 NOTE — Assessment & Plan Note (Signed)
Provided with stool cards for screening.

## 2015-10-08 NOTE — Assessment & Plan Note (Signed)
Patient is due for repeat colonoscopy

## 2015-10-08 NOTE — Assessment & Plan Note (Signed)
Controlled with Miralax QD prn.

## 2015-10-09 LAB — LIPID PANEL
CHOL/HDL RATIO: 3.9 ratio (ref 0.0–4.4)
CHOLESTEROL TOTAL: 158 mg/dL (ref 100–199)
HDL: 41 mg/dL (ref >39)
LDL Calculated: 101 mg/dL — ABNORMAL HIGH (ref 0–99)
TRIGLYCERIDES: 78 mg/dL (ref 0–149)
VLDL Cholesterol Cal: 16 mg/dL (ref 5–40)

## 2015-10-09 NOTE — Progress Notes (Signed)
Internal Medicine Clinic Attending  Case discussed with Dr. Burns soon after the resident saw the patient.  We reviewed the resident's history and exam and pertinent patient test results.  I agree with the assessment, diagnosis, and plan of care documented in the resident's note. 

## 2015-10-10 ENCOUNTER — Encounter: Payer: Self-pay | Admitting: Internal Medicine

## 2015-10-10 ENCOUNTER — Telehealth: Payer: Self-pay | Admitting: Internal Medicine

## 2015-10-10 NOTE — Telephone Encounter (Signed)
Called patient to inform her of her normal lipid panel.

## 2015-10-14 ENCOUNTER — Encounter: Payer: Self-pay | Admitting: Internal Medicine

## 2015-10-14 ENCOUNTER — Other Ambulatory Visit (INDEPENDENT_AMBULATORY_CARE_PROVIDER_SITE_OTHER): Payer: BLUE CROSS/BLUE SHIELD

## 2015-10-14 DIAGNOSIS — Z1211 Encounter for screening for malignant neoplasm of colon: Secondary | ICD-10-CM | POA: Diagnosis not present

## 2015-10-14 LAB — POC HEMOCCULT BLD/STL (HOME/3-CARD/SCREEN)
Card #3 Fecal Occult Blood, POC: NEGATIVE
FECAL OCCULT BLD: NEGATIVE
FECAL OCCULT BLD: POSITIVE — AB

## 2015-10-16 ENCOUNTER — Encounter: Payer: Self-pay | Admitting: Internal Medicine

## 2015-10-16 ENCOUNTER — Ambulatory Visit (INDEPENDENT_AMBULATORY_CARE_PROVIDER_SITE_OTHER): Payer: BLUE CROSS/BLUE SHIELD | Admitting: Internal Medicine

## 2015-10-16 VITALS — BP 136/70 | HR 62 | Temp 98.4°F | Wt 170.1 lb

## 2015-10-16 DIAGNOSIS — R195 Other fecal abnormalities: Secondary | ICD-10-CM

## 2015-10-16 NOTE — Progress Notes (Signed)
   Subjective:    Patient ID: Lauren [name], female    DOB: 1965/12/17, 50 y.o.   MRN: [id number]  HPI Ms. Nusbaum is a 50yo woman with PMHx of HTN, hyperlipidemia, and tobacco abuse who presents today for follow up on test results.  Patient was found to be heme positive on 1 out of 3 stool cards. I discussed these results with her and that she will be referred for colonoscopy. She does report unintentional weight loss recently. Looking back in records, it appears she has lost 13 lbs in the last 1 year. She denies any constipation, diarrhea, melena, hematochezia, abdominal pain, changes in bowel movements, SOB, fatigue, dizziness, fevers, chills, or night sweats. She denies any family history of colon cancer. She is still smoking about 1/2 pack per day.    Review of Systems General: Denies changes in appetite HEENT: Denies headaches, ear pain, changes in vision, rhinorrhea, sore throat CV: Denies CP, palpitations, orthopnea Pulm: Denies cough, wheezing GI: See HPI GU: Denies dysuria, hematuria, frequency Msk: Denies muscle cramps, joint pains Neuro: Denies weakness, numbness, tingling Skin: Denies rashes, bruising Psych: Denies depression, anxiety, hallucinations    Objective:   Physical Exam General: alert, sitting up, NAD HEENT: [place]/AT, EOMI, sclera anicteric, conjunctiva normal, mucus membranes moist CV: RRR, no m/g/r Pulm: CTA bilaterally, breaths non-labored Abd: BS+, soft, non-tender, non-distended, no masses palpated Ext: warm, no peripheral edema Neuro: alert and oriented x 3     Assessment & Plan:  Please refer to A&P documentation.

## 2015-10-16 NOTE — Assessment & Plan Note (Signed)
I discussed the stool card results with the patient and that she will need to undergo colonoscopy for further evaluation. We discussed that the stool cards can be positive for several different reasons, but it is best for [place] to be on the cautious side and have her evaluated for any polyps. She took this news very well and I encouraged her to contact the clinic if she had any further questions. Her unintentional weight loss is concerning. I am going to check a CBC to evaluate for anemia since it has been over a year since her last labs. Referral to GI placed.

## 2015-10-16 NOTE — Progress Notes (Signed)
Internal Medicine Clinic Attending  Case discussed with Dr. Rivet at the time of the visit.  We reviewed the resident's history and exam and pertinent patient test results.  I agree with the assessment, diagnosis, and plan of care documented in the resident's note.  

## 2015-10-16 NOTE — Patient Instructions (Signed)
General Instructions: - You had 1 out of 3 stool cards positive for blood - We will be on cautious side and have you go for colonoscopy - I have placed referral to Gastroenterology. Their office should contact you to set up procedure time/day.  - If you have any questions do not hesitate to contact our clinic  Please bring your medicines with you each time you come to clinic.  Medicines may include prescription medications, over-the-counter medications, herbal remedies, eye drops, vitamins, or other pills.   Progress Toward Treatment Goals:  Treatment Goal 03/14/2014  Blood pressure at goal  Stop smoking smoking the same amount    Self Care Goals & Plans:  Self Care Goal 10/16/2015  Manage my medications take my medicines as prescribed; bring my medications to every visit; refill my medications on time  Eat healthy foods eat more vegetables; eat foods that are low in salt; eat baked foods instead of fried foods  Be physically active find an activity I enjoy  Stop smoking cut down the number of cigarettes smoked    No flowsheet data found.   Care Management & Community Referrals:  No flowsheet data found.

## 2015-10-17 LAB — CBC
HEMATOCRIT: 41.4 % (ref 34.0–46.6)
Hemoglobin: 13.3 g/dL (ref 11.1–15.9)
MCH: 30.2 pg (ref 26.6–33.0)
MCHC: 32.1 g/dL (ref 31.5–35.7)
MCV: 94 fL (ref 79–97)
PLATELETS: 332 10*3/uL (ref 150–379)
RBC: 4.41 x10E6/uL (ref 3.77–5.28)
RDW: 13.5 % (ref 12.3–15.4)
WBC: 12.2 10*3/uL — AB (ref 3.4–10.8)

## 2015-11-14 DIAGNOSIS — R195 Other fecal abnormalities: Secondary | ICD-10-CM | POA: Diagnosis not present

## 2015-12-24 ENCOUNTER — Other Ambulatory Visit: Payer: Self-pay | Admitting: Gastroenterology

## 2015-12-24 DIAGNOSIS — R195 Other fecal abnormalities: Secondary | ICD-10-CM | POA: Diagnosis not present

## 2015-12-24 DIAGNOSIS — D125 Benign neoplasm of sigmoid colon: Secondary | ICD-10-CM | POA: Diagnosis not present

## 2015-12-24 DIAGNOSIS — K5730 Diverticulosis of large intestine without perforation or abscess without bleeding: Secondary | ICD-10-CM | POA: Diagnosis not present

## 2015-12-24 DIAGNOSIS — D126 Benign neoplasm of colon, unspecified: Secondary | ICD-10-CM | POA: Diagnosis not present

## 2015-12-24 HISTORY — PX: COLONOSCOPY W/ POLYPECTOMY: SHX1380

## 2016-01-19 ENCOUNTER — Encounter: Payer: Self-pay | Admitting: Internal Medicine

## 2016-01-19 ENCOUNTER — Ambulatory Visit (INDEPENDENT_AMBULATORY_CARE_PROVIDER_SITE_OTHER): Payer: BLUE CROSS/BLUE SHIELD | Admitting: Internal Medicine

## 2016-01-19 ENCOUNTER — Ambulatory Visit (HOSPITAL_COMMUNITY)
Admission: RE | Admit: 2016-01-19 | Discharge: 2016-01-19 | Disposition: A | Discharge status: Home / Self Care | Payer: BLUE CROSS/BLUE SHIELD | Source: Ambulatory Visit | Attending: Student in an Organized Health Care Education/Training Program | Admitting: Student in an Organized Health Care Education/Training Program

## 2016-01-19 VITALS — BP 133/74 | HR 65 | Temp 98.3°F | Ht 66.0 in | Wt 166.8 lb

## 2016-01-19 DIAGNOSIS — M47892 Other spondylosis, cervical region: Secondary | ICD-10-CM | POA: Insufficient documentation

## 2016-01-19 DIAGNOSIS — I10 Essential (primary) hypertension: Secondary | ICD-10-CM

## 2016-01-19 DIAGNOSIS — Z0000 Encounter for general adult medical examination without abnormal findings: Secondary | ICD-10-CM

## 2016-01-19 DIAGNOSIS — M542 Cervicalgia: Secondary | ICD-10-CM | POA: Insufficient documentation

## 2016-01-19 DIAGNOSIS — M50321 Other cervical disc degeneration at C4-C5 level: Secondary | ICD-10-CM | POA: Diagnosis not present

## 2016-01-19 DIAGNOSIS — M5412 Radiculopathy, cervical region: Secondary | ICD-10-CM

## 2016-01-19 DIAGNOSIS — D126 Benign neoplasm of colon, unspecified: Secondary | ICD-10-CM | POA: Diagnosis not present

## 2016-01-19 DIAGNOSIS — R195 Other fecal abnormalities: Secondary | ICD-10-CM

## 2016-01-19 DIAGNOSIS — M4802 Spinal stenosis, cervical region: Secondary | ICD-10-CM | POA: Diagnosis not present

## 2016-01-19 NOTE — Assessment & Plan Note (Signed)
Repeat colonoscopy in 2022 Repeat Mammogram 03/2017

## 2016-01-19 NOTE — Patient Instructions (Signed)
WE WILL PROCEED WITH NECK XRAYS. I WILL CALL YOU WITH RESULTS TOMORROW.  USE WARM COMPRESSES FOR PAIN AND EXCEDRIN FOR PAIN.  PLEASE CONTINUE ALL OTHER MEDICATIONS AS PRESCRIBED.

## 2016-01-19 NOTE — Assessment & Plan Note (Signed)
Colonoscopy revealed a tubular adenoma.   Plan: -Repeat colonoscopy in 5 years

## 2016-01-19 NOTE — Assessment & Plan Note (Signed)
BP Readings from Last 3 Encounters:  01/19/16 133/74  10/16/15 136/70  10/08/15 129/76    Lab Results  Component Value Date   NA 146 (H) 03/19/2015   K 4.0 03/19/2015   CREATININE 0.67 03/19/2015    Assessment: Blood pressure control:  Controlled Progress toward BP goal:    At goal Comments: Compliant with lisinopril 40 mg daily, atenolol 100 mg daily, and amlodipine 10 mg daily.   Plan: Medications:  continue current medications Other plans: Follow up in 3 months.

## 2016-01-19 NOTE — Progress Notes (Signed)
    CC: neck pain  HPI: LaurenLauren [name] is a 50 y.o. female with PMHx of HTN and tobacco abuse who presents to the clinic for neck pain. Please see problem oriented charting for more information..   Past Medical History:  Diagnosis Date  . Allergy    seasonal  . Headache(784.0) 2008    improving  . Heart murmur    History of Heart murmur as child.  Marland Kitchen HLD (hyperlipidemia) 2008  . Hypertension   . Shoulder pain, right 07/2008    Review of Systems: A complete ROS was negative except as noted in HPI.   Physical Exam: Vitals:   01/19/16 1319  BP: 133/74  Pulse: 65  Temp: 98.3 F (36.8 C)  TempSrc: Oral  SpO2: 100%  Weight: 166 lb 12.8 oz (75.7 kg)  Height: 5\' 6"  (1.676 m)   General: Vital signs reviewed.  Patient is well-developed and well-nourished, in no acute distress and cooperative with exam.   Cardiovascular: RRR, S1 normal, S2 normal, no murmurs, gallops, or rubs. Pulmonary/Chest: Clear to auscultation bilaterally, no wheezes, rales, or rhonchi. Abdominal: Soft, non-tender, non-distended, BS + MSK: Decreased neck extension, bilateral sidebending, rotation and flexion. Tender point within left trapezius. Normal strength and normal sensation of bilateral upper extremities. 2+ radial pulses. Extremities: No lower extremity edema bilaterally Skin: Warm, dry and intact. No rashes or erythema. Psychiatric: Normal mood and affect. speech and behavior is normal. Cognition and memory are normal.   Assessment & Plan:  See encounters tab for problem based medical decision making. Patient discussed with Dr. Beryle Beams

## 2016-01-19 NOTE — Assessment & Plan Note (Addendum)
Patient describes chronic neck pain, intermittent, but worsening over the past 4 months. Pain is primarily left sided, radiating from neck to left shoulder. She describes the pain as "pulling." Pain is worse with movement, nothing has seemed to make it better including flexeril (just makes her fatigued), naproxen, or advil. She denies associated numbness, tingling or weakness in her left upper extremity. Physical exam is consistent with tender point in left trapezius muscles, decreased rotation, sidebending, flexion and extension of cervical spine and a positive Spurling's test. Cervical xray was obtained which showed no evidence of acute fracture of the cervical spine. Multilevel osteoarthritic changes, most severe at C4-C5 and C5-C6 with minimal posterior listhesis of C5 on C6 and mild narrowing of the right C4-C5 bony neural foramen.  Assessment: -Will discuss starting PT- discussed, patient not interested at this time -Would like to try gabapentin  -No indications for surgery at this time

## 2016-01-20 MED ORDER — GABAPENTIN 100 MG PO CAPS: 100.0000 mg | ORAL_CAPSULE | Freq: Three times a day (TID) | ORAL | 2 refills | Status: DC

## 2016-01-20 NOTE — Addendum Note (Signed)
Addended by: Martyn Malay R on: 01/20/2016 04:58 PM   Modules accepted: Orders

## 2016-01-21 NOTE — Progress Notes (Signed)
Medicine attending: Medical history, presenting problems, physical findings, and medications, reviewed with resident physician Dr Alexa Burns on the day of the patient visit and I concur with her evaluation and management plan. 

## 2016-03-11 ENCOUNTER — Telehealth: Payer: Self-pay | Admitting: *Deleted

## 2016-03-11 NOTE — Telephone Encounter (Signed)
Fax from [facility] - Atenolol is on backorder; wants to now if you could change to something else? Thanks

## 2016-03-12 ENCOUNTER — Other Ambulatory Visit: Payer: Self-pay | Admitting: Internal Medicine

## 2016-03-12 MED ORDER — METOPROLOL SUCCINATE ER 200 MG PO TB24: 200.0000 mg | ORAL_TABLET | Freq: Every day | ORAL | 11 refills | Status: DC

## 2016-03-12 NOTE — Telephone Encounter (Signed)
Pt called and informed of Toprol rx - stated she already has it.

## 2016-03-12 NOTE — Telephone Encounter (Signed)
Changed Atenolol 100 mg QD to Toprol XL 200 mg QD per conversion charting as Atenolol is on backorder. Toprol has been sent in electronically.

## 2016-03-16 ENCOUNTER — Ambulatory Visit (INDEPENDENT_AMBULATORY_CARE_PROVIDER_SITE_OTHER): Payer: BLUE CROSS/BLUE SHIELD | Admitting: Internal Medicine

## 2016-03-16 ENCOUNTER — Encounter: Payer: Self-pay | Admitting: Internal Medicine

## 2016-03-16 ENCOUNTER — Telehealth: Payer: Self-pay | Admitting: Internal Medicine

## 2016-03-16 VITALS — BP 134/76 | HR 73 | Temp 98.3°F | Resp 20 | Wt 165.3 lb

## 2016-03-16 DIAGNOSIS — Z0000 Encounter for general adult medical examination without abnormal findings: Secondary | ICD-10-CM

## 2016-03-16 DIAGNOSIS — Z23 Encounter for immunization: Secondary | ICD-10-CM | POA: Diagnosis not present

## 2016-03-16 DIAGNOSIS — F17210 Nicotine dependence, cigarettes, uncomplicated: Secondary | ICD-10-CM | POA: Diagnosis not present

## 2016-03-16 DIAGNOSIS — M5412 Radiculopathy, cervical region: Secondary | ICD-10-CM | POA: Diagnosis not present

## 2016-03-16 DIAGNOSIS — I10 Essential (primary) hypertension: Secondary | ICD-10-CM

## 2016-03-16 DIAGNOSIS — Z79899 Other long term (current) drug therapy: Secondary | ICD-10-CM

## 2016-03-16 MED ORDER — DICLOFENAC SODIUM 1 % TD GEL: 2.0000 g | Freq: Four times a day (QID) | TRANSDERMAL | 1 refills | Status: DC

## 2016-03-16 MED ORDER — NAPROXEN 500 MG PO TABS: 500.0000 mg | ORAL_TABLET | Freq: Every day | ORAL | 2 refills | Status: DC

## 2016-03-16 NOTE — Assessment & Plan Note (Signed)
Colonoscopy: due 2022 Flu Vaccine: Received 03/16/16 Pap Smear; Due 08/2016 Mammogram: Will schedule

## 2016-03-16 NOTE — Assessment & Plan Note (Signed)
Patient has chronic cervical radiculopathy with plain films confirming severe OA of C4-5 and C5-6. She is limited in rotation and sidebending for her cervical spine. She has not recently been taking medications for pain. 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   Plan: -Naproxen 500 mg QAM -Voltaren gel  -Note for work

## 2016-03-16 NOTE — Progress Notes (Signed)
    CC: neck pain  HPI: [name] is a 50 y.o. female with PMHx of chronic cervical radiculopathy, HTN who presents to the clinic for follow up for HTN. Please see problem based assessment and plan for more information.  Patient admits to chronic neck pain. She denies chest pain or shortness of breath.   Past Medical History:  Diagnosis Date  . Allergy    seasonal  . Headache(784.0) 2008    improving  . Heart murmur    History of Heart murmur as child.  Marland Kitchen HLD (hyperlipidemia) 2008  . Hypertension   . Shoulder pain, right 07/2008   Review of Systems: Please see pertinent ROS reviewed in HPI and problem based charting.    Physical Exam: Vitals:   03/16/16 1315  BP: 134/76  Pulse: 73  Resp: 20  Temp: 98.3 F (36.8 C)  TempSrc: Oral  SpO2: 100%  Weight: 165 lb 4.8 oz (75 kg)   General: Vital signs reviewed.  Patient is well-developed and well-nourished, in no acute distress and cooperative with exam.  Neck: Supple, trachea midline, decreased ROM in right and left rotation and right and left sidebending, no masses, thyromegaly, or carotid bruit present.  Cardiovascular: RRR, S1 normal, S2 normal, no murmurs, gallops, or rubs. Pulmonary/Chest: Clear to auscultation bilaterally, no wheezes, rales, or rhonchi. Abdominal: Soft, non-tender, non-distended, BS + Extremities: No lower extremity edema bilaterally Skin: Warm, dry and intact.   Assessment & Plan:  See encounters tab for problem based medical decision making. Patient discussed with Dr. Lynnae January

## 2016-03-16 NOTE — Assessment & Plan Note (Signed)
BP Readings from Last 3 Encounters:  03/16/16 134/76  01/19/16 133/74  10/16/15 136/70    Lab Results  Component Value Date   NA 146 (H) 03/19/2015   K 4.0 03/19/2015   CREATININE 0.67 03/19/2015    Assessment: Blood pressure control:   controlled Progress toward BP goal:   at goal Comments: compliant with lisinopril 40 mg QD and amlodipine 10 mg QD. Atenolol on back order for her migraine prophylaxis. Prefers not start propranolol at this time.   Plan: Medications:  continue current medications

## 2016-03-16 NOTE — Patient Instructions (Signed)
Continue all medications as prescribed. Take naproxen and voltaren gel for your neck pain. Follow up in 6 months.

## 2016-03-16 NOTE — Telephone Encounter (Signed)
APT. REMINDER CALL, NO ANSWER, NO VOICEMAIL °

## 2016-03-17 ENCOUNTER — Encounter: Payer: PRIVATE HEALTH INSURANCE | Admitting: Internal Medicine

## 2016-03-17 NOTE — Progress Notes (Signed)
Internal Medicine Clinic Attending  Case discussed with Dr. Burns soon after the resident saw the patient.  We reviewed the resident's history and exam and pertinent patient test results.  I agree with the assessment, diagnosis, and plan of care documented in the resident's note. 

## 2016-03-23 ENCOUNTER — Telehealth: Payer: Self-pay | Admitting: *Deleted

## 2016-03-23 NOTE — Telephone Encounter (Signed)
Call to [facility] for Prior Authorization for Diclofenac Gel 1%.  Information given. Forms to be faxed for physician completion.  Sander Nephew, RN 1010/2017 4:17 PM

## 2016-03-24 ENCOUNTER — Encounter: Payer: PRIVATE HEALTH INSURANCE | Admitting: Internal Medicine

## 2016-03-31 ENCOUNTER — Other Ambulatory Visit: Payer: Self-pay | Admitting: Family Medicine

## 2016-03-31 ENCOUNTER — Other Ambulatory Visit: Payer: Self-pay | Admitting: Internal Medicine

## 2016-03-31 DIAGNOSIS — Z1231 Encounter for screening mammogram for malignant neoplasm of breast: Secondary | ICD-10-CM

## 2016-04-14 ENCOUNTER — Encounter: Payer: Self-pay | Admitting: *Deleted

## 2016-04-15 NOTE — Telephone Encounter (Signed)
Fax from [employer]-Den[employer] given for Diclofenac Gell. Dr. Quay Burow to be notified.  Regino Schultze Fabian Coca,RN 04/15/2016 2:45 PM

## 2016-04-15 NOTE — Telephone Encounter (Signed)
Okay, that's fine. Thank you for letting me know.

## 2016-04-20 ENCOUNTER — Ambulatory Visit: Payer: BLUE CROSS/BLUE SHIELD

## 2016-05-17 ENCOUNTER — Ambulatory Visit
Admission: RE | Admit: 2016-05-17 | Discharge: 2016-05-17 | Disposition: A | Discharge status: Home / Self Care | Payer: BLUE CROSS/BLUE SHIELD | Source: Ambulatory Visit | Attending: Pediatric Critical Care Medicine | Admitting: Pediatric Critical Care Medicine

## 2016-05-17 DIAGNOSIS — Z1231 Encounter for screening mammogram for malignant neoplasm of breast: Secondary | ICD-10-CM

## 2016-06-23 ENCOUNTER — Other Ambulatory Visit: Payer: Self-pay | Admitting: Internal Medicine

## 2016-07-13 ENCOUNTER — Other Ambulatory Visit: Payer: Self-pay | Admitting: Internal Medicine

## 2016-07-13 DIAGNOSIS — I10 Essential (primary) hypertension: Secondary | ICD-10-CM

## 2016-11-17 ENCOUNTER — Encounter: Payer: Self-pay | Admitting: *Deleted

## 2016-12-08 ENCOUNTER — Encounter: Payer: Self-pay | Admitting: Internal Medicine

## 2016-12-08 ENCOUNTER — Other Ambulatory Visit (HOSPITAL_COMMUNITY)
Admission: RE | Admit: 2016-12-08 | Discharge: 2016-12-08 | Disposition: A | Discharge status: Home / Self Care | Payer: BLUE CROSS/BLUE SHIELD | Source: Ambulatory Visit | Attending: Internal Medicine | Admitting: Internal Medicine

## 2016-12-08 ENCOUNTER — Other Ambulatory Visit: Payer: Self-pay | Admitting: Internal Medicine

## 2016-12-08 ENCOUNTER — Ambulatory Visit (INDEPENDENT_AMBULATORY_CARE_PROVIDER_SITE_OTHER): Payer: BLUE CROSS/BLUE SHIELD | Admitting: Internal Medicine

## 2016-12-08 VITALS — BP 136/79 | HR 88 | Temp 98.2°F | Ht 66.0 in | Wt 151.9 lb

## 2016-12-08 DIAGNOSIS — I10 Essential (primary) hypertension: Secondary | ICD-10-CM

## 2016-12-08 DIAGNOSIS — F17210 Nicotine dependence, cigarettes, uncomplicated: Secondary | ICD-10-CM | POA: Diagnosis not present

## 2016-12-08 DIAGNOSIS — J302 Other seasonal allergic rhinitis: Secondary | ICD-10-CM | POA: Insufficient documentation

## 2016-12-08 DIAGNOSIS — G5603 Carpal tunnel syndrome, bilateral upper limbs: Secondary | ICD-10-CM | POA: Diagnosis not present

## 2016-12-08 DIAGNOSIS — G5600 Carpal tunnel syndrome, unspecified upper limb: Secondary | ICD-10-CM | POA: Insufficient documentation

## 2016-12-08 DIAGNOSIS — Z0000 Encounter for general adult medical examination without abnormal findings: Secondary | ICD-10-CM

## 2016-12-08 DIAGNOSIS — Z124 Encounter for screening for malignant neoplasm of cervix: Secondary | ICD-10-CM

## 2016-12-08 DIAGNOSIS — Z79899 Other long term (current) drug therapy: Secondary | ICD-10-CM

## 2016-12-08 DIAGNOSIS — J301 Allergic rhinitis due to pollen: Secondary | ICD-10-CM

## 2016-12-08 HISTORY — DX: Encounter for screening for malignant neoplasm of cervix: Z12.4

## 2016-12-08 MED ORDER — DICLOFENAC SODIUM 1 % TD GEL: 2.0000 g | Freq: Four times a day (QID) | TRANSDERMAL | 1 refills | Status: DC

## 2016-12-08 MED ORDER — NAPROXEN 500 MG PO TABS: 500.0000 mg | ORAL_TABLET | Freq: Two times a day (BID) | ORAL | 0 refills | Status: DC

## 2016-12-08 MED ORDER — CETIRIZINE HCL 10 MG PO TABS: 10.0000 mg | ORAL_TABLET | Freq: Every day | ORAL | 2 refills | Status: DC

## 2016-12-08 NOTE — Assessment & Plan Note (Addendum)
Patient states that 3-4 months ago she was lifting a heavy box and felt a pop in her right wrist. This was followed by pain which later resolved on its own but persisted with occasional numbness in the tips of all 5 fingers. She denies any weakness in the right hand. She has not tried anything for her symptoms. She works as a Barrister's clerk at [employer]. She denies frequent typing. Patient also complains of chronic left hand pain and occasional numbness particularly in the left thumb and third finger. This is worse with movement and activity. She denies weakness. She has not tried anything for her symptoms. She has never had these issues before.  On physical exam, patient has normal strength and sensation, but positive Tinel sign bilaterally, positive prayer sign bilaterally and positive Finkelstein test on left. Symptoms and presentation are most consistent with carpal tunnel syndrome. Would also consider de Quervian tendinopathy.  Assessment: Bilateral carpal tunnel syndrome  Plan: -Naproxen 500 mg twice a day with meals -Bilateral wrist splint, wear at night and at work -Return if not improved, consider other etiologies, consider steroid injection

## 2016-12-08 NOTE — Assessment & Plan Note (Signed)
BP Readings from Last 3 Encounters:  12/08/16 136/79  03/16/16 134/76  01/19/16 133/74    Lab Results  Component Value Date   NA 146 (H) 03/19/2015   K 4.0 03/19/2015   CREATININE 0.67 03/19/2015    Assessment: Blood pressure control:  controlled Progress toward BP goal:   at goal Comments: Compliant with lisinopril 40 mg daily and amlodipine 10 mg daily  Plan: Medications:  continue current medications Other plans: Repeat basic metabolic panel today. Follow-up in 6 months

## 2016-12-08 NOTE — Assessment & Plan Note (Signed)
Cervical cancer screening: Pap smear performed today Breast cancer screening: Mammogram normal in December 2017, repeat December 2018 Colon cancer screening: Repeat in 2022

## 2016-12-08 NOTE — Patient Instructions (Signed)
Your wrist pain may be due to carpal tunnel syndrome. I recommend buying wrist splints for both wrists and wearing them at night and when you're at work. Also use naproxen twice a day with meals. If your pain and numbness persists, we should consider other causes and they consider steroid injections. I will notify you with the results of your Pap smear. We will check blood work today to check your kidneys. Please continue all medications as prescribed.   Carpal Tunnel Syndrome Carpal tunnel syndrome is a condition that causes pain in your hand and arm. The carpal tunnel is a narrow area that is on the palm side of your wrist. Repeated wrist motion or certain diseases may cause swelling in the tunnel. This swelling can pinch the main nerve in the wrist (median nerve). Follow these instructions at home: If you have a splint:  Wear it as told by your doctor. Remove it only as told by your doctor.  Loosen the splint if your fingers: ? Become numb and tingle. ? Turn blue and cold.  Keep the splint clean and dry. General instructions  Take over-the-counter and prescription medicines only as told by your doctor.  Rest your wrist from any activity that may be causing your pain. If needed, talk to your employer about changes that can be made in your work, such as getting a wrist pad to use while typing.  If directed, apply ice to the painful area: ? Put ice in a plastic bag. ? Place a towel between your skin and the bag. ? Leave the ice on for 20 minutes, 2-3 times per day.  Keep all follow-up visits as told by your doctor. This is important.  Do any exercises as told by your doctor, physical therapist, or occupational therapist. Contact a doctor if:  You have new symptoms.  Medicine does not help your pain.  Your symptoms get worse. This information is not intended to replace advice given to you by your health care provider. Make sure you discuss any questions you have with your health  care provider. Document Released: 05/20/2011 Document Revised: 11/06/2015 Document Reviewed: 10/16/2014 Elsevier Interactive Patient Education  [name].

## 2016-12-08 NOTE — Assessment & Plan Note (Addendum)
Pap smear performed today. Results pending.

## 2016-12-08 NOTE — Progress Notes (Signed)
    CC: Bilateral wrist pain  HPI: [name] is a 51 y.o. female with PMHx of hyperlipidemia, hypertension, tobacco abuse who presents to the clinic for bilateral wrist pain.   Patient states that 3-4 months ago she was lifting a heavy box and felt a pop in her right wrist. This was followed by pain which later resolved on its own but persisted with occasional numbness in the tips of all 5 fingers. She denies any weakness in the right hand. She has not tried anything for her symptoms. She works as a Barrister's clerk at [employer]. She denies frequent typing. Patient also complains of chronic left hand pain and occasional numbness particularly in the left thumb and third finger. This is worse with movement and activity. She denies weakness. She has not tried anything for her symptoms. She has never had these issues before.  Please see problem based assessment and plan for more information of patient's chronic medical conditions.   Past Medical History:  Diagnosis Date  . Allergy    seasonal  . Headache(784.0) 2008    improving  . Heart murmur    History of Heart murmur as child.  Marland Kitchen HLD (hyperlipidemia) 2008  . Hypertension   . Shoulder pain, right 07/2008    Review of Systems: Please see pertinent ROS reviewed in HPI and problem based charting.   Physical Exam: Blood pressure 136/79, pulse 88, temperature 98.2 F (36.8 C), temperature source Oral, height 5\' 6"  (1.676 m), weight 151 lb 14.4 oz (68.9 kg), last menstrual period 08/09/2012, SpO2 100 %. General: Vital signs reviewed.  Patient is in no acute distress and cooperative with exam.  Cardiovascular: RRR,  no murmurs, gallops, or rubs. No lower extremity edema bilaterally. Bilateral radial pulses are intact and symmetric bilaterally.  Pulmonary: Clear to auscultation bilaterally, no wheezes, rales, or rhonchi. No accessory muscle use. Gastrointestinal: Soft, non-tender, non-distended, BS + Pelvic exam: VULVA: normal appearing  vulva with no masses, tenderness or lesions, VAGINA: normal appearing vagina with normal color and discharge, no lesions, CERVIX: normal appearing cervix without discharge or lesions, PAP: Pap smear done today, exam chaperoned by Morrison Old. Musculoskeletal: Positive Tinel sign bilaterally, positive prayer sign bilaterally, positive Finkelstein test in left thumb. Normal grip strength bilaterally.  Neurologic: Strength is normal and symmetric bilaterally in upper extremities, sensory intact to light touch bilaterally.  Skin: Warm, dry and intact. No rashes or erythema. Psychiatric: Normal mood and affect. speech and behavior is normal.   Assessment & Plan:  See encounters tab for problem based medical decision making. Patient discussed with Dr. Dareen Piano

## 2016-12-08 NOTE — Assessment & Plan Note (Addendum)
Patient complains of a 3 month history of nasal congestion, runny nose, itchy tearing eyes. She has not tried anything for her symptoms. She reports a history of seasonal allergies, but has not been on any medications for this recently. She is adverse to trying nasal sprays.  Assessment: Seasonal allergies  Plan: -Cetirizine daily

## 2016-12-09 ENCOUNTER — Telehealth: Payer: Self-pay | Admitting: *Deleted

## 2016-12-09 LAB — BMP8+ANION GAP
Anion Gap: 17 mmol/L (ref 10.0–18.0)
BUN / CREAT RATIO: 14 (ref 9–23)
BUN: 10 mg/dL (ref 6–24)
CHLORIDE: 104 mmol/L (ref 96–106)
CO2: 22 mmol/L (ref 20–29)
Calcium: 9.3 mg/dL (ref 8.7–10.2)
Creatinine, Ser: 0.72 mg/dL (ref 0.57–1.00)
GFR calc non Af Amer: 97 mL/min/{1.73_m2} (ref >59)
GFR, EST AFRICAN AMERICAN: 112 mL/min/{1.73_m2} (ref >59)
GLUCOSE: 92 mg/dL (ref 65–99)
POTASSIUM: 4.1 mmol/L (ref 3.5–5.2)
Sodium: 143 mmol/L (ref 134–144)

## 2016-12-09 NOTE — Telephone Encounter (Signed)
Pt called / informed "her kidney function, sodium and potassium were all normal" per Dr Quay Burow. Stated thank-you for calling.

## 2016-12-09 NOTE — Progress Notes (Signed)
Internal Medicine Clinic Attending  Case discussed with Dr. Burns at the time of the visit.  We reviewed the resident's history and exam and pertinent patient test results.  I agree with the assessment, diagnosis, and plan of care documented in the resident's note.  

## 2016-12-10 ENCOUNTER — Telehealth: Payer: Self-pay | Admitting: *Deleted

## 2016-12-10 LAB — CYTOLOGY - PAP
Diagnosis: NEGATIVE
HPV: NOT DETECTED

## 2016-12-10 NOTE — Telephone Encounter (Signed)
Called pt - no answer; left message to give [place] a call back about results (PAP) per Dr Quay Burow.

## 2016-12-13 ENCOUNTER — Telehealth: Payer: Self-pay | Admitting: *Deleted

## 2016-12-13 NOTE — Telephone Encounter (Signed)
Information was sent to CoverMyMeds for PA for Diclofenac Gel. Determination to be made within 72 hours.  Sander Nephew, RN 12/13/2016 10:53 AM.

## 2016-12-14 NOTE — Telephone Encounter (Signed)
Called pt - not at home, left message to give me a call back.

## 2016-12-20 NOTE — Telephone Encounter (Signed)
Pt called / informed "her pap smear was normal' per Dr Quay Burow.

## 2017-04-04 ENCOUNTER — Other Ambulatory Visit: Payer: Self-pay | Admitting: Pediatric Critical Care Medicine

## 2017-04-04 DIAGNOSIS — Z1231 Encounter for screening mammogram for malignant neoplasm of breast: Secondary | ICD-10-CM

## 2017-05-18 ENCOUNTER — Ambulatory Visit
Admission: RE | Admit: 2017-05-18 | Discharge: 2017-05-18 | Disposition: A | Discharge status: Home / Self Care | Payer: BLUE CROSS/BLUE SHIELD | Source: Ambulatory Visit | Attending: Pediatric Critical Care Medicine | Admitting: Pediatric Critical Care Medicine

## 2017-05-18 DIAGNOSIS — Z1231 Encounter for screening mammogram for malignant neoplasm of breast: Secondary | ICD-10-CM

## 2017-06-17 ENCOUNTER — Other Ambulatory Visit: Payer: Self-pay | Admitting: *Deleted

## 2017-06-17 MED ORDER — LISINOPRIL 40 MG PO TABS: ORAL_TABLET | ORAL | 3 refills | Status: DC

## 2017-06-17 MED ORDER — PRAVASTATIN SODIUM 40 MG PO TABS: 40.0000 mg | ORAL_TABLET | Freq: Every day | ORAL | 3 refills | Status: DC

## 2017-06-20 ENCOUNTER — Encounter: Payer: Self-pay | Admitting: *Deleted

## 2017-07-11 ENCOUNTER — Other Ambulatory Visit: Payer: Self-pay | Admitting: *Deleted

## 2017-07-11 DIAGNOSIS — I10 Essential (primary) hypertension: Secondary | ICD-10-CM

## 2017-07-11 MED ORDER — AMLODIPINE BESYLATE 10 MG PO TABS: ORAL_TABLET | ORAL | 0 refills | Status: DC

## 2017-07-11 NOTE — Telephone Encounter (Signed)
I would like to see her at my next available appointment. If it is greater than 2 months out please have he come to [facility] sometime next month for a BP check.  Kathi Ludwig, MD

## 2017-08-03 ENCOUNTER — Encounter: Payer: BLUE CROSS/BLUE SHIELD | Admitting: Internal Medicine

## 2017-08-10 ENCOUNTER — Other Ambulatory Visit: Payer: Self-pay

## 2017-08-10 ENCOUNTER — Ambulatory Visit (INDEPENDENT_AMBULATORY_CARE_PROVIDER_SITE_OTHER): Payer: BLUE CROSS/BLUE SHIELD | Admitting: Internal Medicine

## 2017-08-10 VITALS — BP 137/83 | HR 84 | Temp 98.4°F | Ht 66.0 in | Wt 142.5 lb

## 2017-08-10 DIAGNOSIS — Z791 Long term (current) use of non-steroidal anti-inflammatories (NSAID): Secondary | ICD-10-CM

## 2017-08-10 DIAGNOSIS — Z79899 Other long term (current) drug therapy: Secondary | ICD-10-CM

## 2017-08-10 DIAGNOSIS — M654 Radial styloid tenosynovitis [de Quervain]: Secondary | ICD-10-CM | POA: Diagnosis not present

## 2017-08-10 DIAGNOSIS — I10 Essential (primary) hypertension: Secondary | ICD-10-CM

## 2017-08-10 MED ORDER — MELOXICAM 7.5 MG PO TABS: 7.5000 mg | ORAL_TABLET | Freq: Every day | ORAL | 0 refills | Status: DC

## 2017-08-10 NOTE — Progress Notes (Signed)
   CC: Blood pressure follow-up, left wrist pain  HPI:  Lauren [name] is a 52 y.o. female with a past medical history of conditions listed below presenting to the clinic for a follow-up of hypertension.  She is also complaining of pain at the base of her left thumb. Please see problem based charting for the status of the patient's current and chronic medical conditions.   Past Medical History:  Diagnosis Date  . Allergy    seasonal  . Headache(784.0) 2008    improving  . Heart murmur    History of Heart murmur as child.  Marland Kitchen HLD (hyperlipidemia) 2008  . Hypertension   . Shoulder pain, right 07/2008   Review of Systems: Pertinent positives mentioned in HPI. Remainder of all ROS negative.   Physical Exam:  Vitals:   08/10/17 1552  BP: 137/83  Pulse: 84  Temp: 98.4 F (36.9 C)  TempSrc: Oral  SpO2: 100%  Weight: 142 lb 8 oz (64.6 kg)  Height: 5\' 6"  (1.676 m)   Physical Exam  Constitutional: She is oriented to person, place, and time. She appears well-developed and well-nourished. No distress.  HENT:  Head: Normocephalic and atraumatic.  Mouth/Throat: Oropharynx is clear and moist.  Eyes: Right eye exhibits no discharge. Left eye exhibits no discharge.  Cardiovascular: Normal rate, regular rhythm and intact distal pulses.  Pulmonary/Chest: Effort normal and breath sounds normal. No respiratory distress. She has no wheezes. She has no rales.  Abdominal: Soft. Bowel sounds are normal. She exhibits no distension.  Musculoskeletal: She exhibits no edema.  Left wrist: Tinnel's sign negative, Finkelstein's test positive Grip strength 5 out of 5 and sensation to light touch intact  Neurological: She is alert and oriented to person, place, and time.  Skin: Skin is warm and dry.    Assessment & Plan:   See Encounters Tab for problem based charting.  Patient discussed with Dr. Dareen Piano

## 2017-08-10 NOTE — Assessment & Plan Note (Addendum)
BP Readings from Last 3 Encounters:  08/10/17 137/83  12/08/16 136/79  03/16/16 134/76    Lab Results  Component Value Date   NA 143 12/08/2016   K 4.1 12/08/2016   CREATININE 0.72 12/08/2016    Assessment: Blood pressure control:  Well-controlled Comments: Currently taking lisinopril 40 mg daily and amlodipine 10 mg daily.  Plan: Medications:  continue current medications Other plans: BMP at this visit to assess renal function and electrolyte status.  Addendum: Renal function and electrolytes normal.  Tried calling the patient but could not reach her over the phone.

## 2017-08-10 NOTE — Patient Instructions (Addendum)
Lauren [name] it was nice meeting you today.  For high blood pressure:  -Continue taking lisinopril 40 mg daily  -Continue taking amlodipine 10 mg daily   For the pain in your left wrist:  -Take Mobic 7.5 mg once daily with food  -Use a thumb spica splint   FOLLOW-UP INSTRUCTIONS When: September 21, 2017 at 3:45 PM For: Regular checkup with your primary care doctor What to bring: Medications  Lauren [name] Tenosynovitis Tendons attach muscles to bones. They also help with joint movements. When tendons become irritated or swollen, it is called tendinitis. The extensor pollicis brevis (EPB) tendon connects the EPB muscle to a bone that is near the base of the thumb. The EPB muscle helps to straighten and extend the thumb. De Quervain tenosynovitis is a condition in which the EPB tendon lining (sheath) becomes irritated, thickened, and swollen. This condition is sometimes called stenosing tenosynovitis. This condition causes pain on the thumb side of the back of the wrist. What are the causes? Causes of this condition include:  Activities that repeatedly cause your thumb and wrist to extend.  A sudden increase in activity or change in activity that affects your wrist.  What increases the risk? This condition is more likely to develop in:  Females.  People who have diabetes.  Women who have recently given birth.  People who are over 40 years of age.  People who do activities that involve repeated hand and wrist motions, such as tennis, racquetball, volleyball, gardening, and taking care of children.  People who do heavy labor.  People who have poor wrist strength and flexibility.  People who do not warm up properly before activities.  What are the signs or symptoms? Symptoms of this condition include:  Pain or tenderness over the thumb side of the back of the wrist when your thumb and wrist are not moving.  Pain that gets worse when you straighten your thumb or extend your  thumb or wrist.  Pain when the injured area is touched.  Locking or catching of the thumb joint while you bend and straighten your thumb.  Decreased thumb motion due to pain.  Swelling over the affected area.  How is this diagnosed? This condition is diagnosed with a medical history and physical exam. Your health care provider will ask for details about your injury and ask about your symptoms. How is this treated? Treatment may include the use of icing and medicines to reduce pain and swelling. You may also be advised to wear a splint or brace to limit your thumb and wrist motion. In less severe cases, treatment may also include working with a physical therapist to strengthen your wrist and calm the irritation around your EPB tendon sheath. In severe cases, surgery may be needed. Follow these instructions at home: If you have a splint or brace:  Wear it as told by your health care provider. Remove it only as told by your health care provider.  Loosen the splint or brace if your fingers become numb and tingle, or if they turn cold and blue.  Keep the splint or brace clean and dry. Managing pain, stiffness, and swelling  If directed, apply ice to the injured area. ? Put ice in a plastic bag. ? Place a towel between your skin and the bag. ? Leave the ice on for 20 minutes, 2-3 times per day.  Move your fingers often to avoid stiffness and to lessen swelling.  Raise (elevate) the injured area above the level  of your heart while you are sitting or lying down. General instructions  Return to your normal activities as told by your health care provider. Ask your health care provider what activities are safe for you.  Take over-the-counter and prescription medicines only as told by your health care provider.  Keep all follow-up visits as told by your health care provider. This is important.  Do not drive or operate heavy machinery while taking prescription pain medicine. Contact a  health care provider if:  Your pain, tenderness, or swelling gets worse, even if you have had treatment.  You have numbness or tingling in your wrist, hand, or fingers on the injured side. This information is not intended to replace advice given to you by your health care provider. Make sure you discuss any questions you have with your health care provider. Document Released: 05/31/2005 Document Revised: 11/06/2015 Document Reviewed: 08/06/2014 Elsevier Interactive Patient Education  [name].

## 2017-08-10 NOTE — Assessment & Plan Note (Signed)
Patient is complaining of pain at the base of her left thumb.  States the pain has been present for close to a year.  She denies any history of injury to the area or falls. She has tried over-the-counter Excedrin and ibuprofen on occasion and they do not seem to help.  She denies having any sharp shooting pain or paresthesias in her fingers. 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  On exam, Tinnel's sign negative but Finkelstein's test was positive.  Grip strength 5 out of 5 and sensation to light touch intact.  Explained to the patient that the pain at the base of her left thumb is likely due to de Quervain's tenosynovitis.  Discussed naproxen but patient stated this drug had previously caused her to have an upset stomach despite taking it with food.  Plan -Mobic 7.5 mg daily with food -Thumb spica splint -Reassess symptoms at her next visit

## 2017-08-11 LAB — BMP8+ANION GAP
Anion Gap: 15 mmol/L (ref 10.0–18.0)
BUN/Creatinine Ratio: 20 (ref 9–23)
BUN: 12 mg/dL (ref 6–24)
CALCIUM: 9.3 mg/dL (ref 8.7–10.2)
CO2: 24 mmol/L (ref 20–29)
Chloride: 103 mmol/L (ref 96–106)
Creatinine, Ser: 0.59 mg/dL (ref 0.57–1.00)
GFR calc Af Amer: 123 mL/min/{1.73_m2} (ref >59)
GFR, EST NON AFRICAN AMERICAN: 106 mL/min/{1.73_m2} (ref >59)
Glucose: 94 mg/dL (ref 65–99)
POTASSIUM: 3.9 mmol/L (ref 3.5–5.2)
SODIUM: 142 mmol/L (ref 134–144)

## 2017-08-11 NOTE — Progress Notes (Signed)
Internal Medicine Clinic Attending  Case discussed with Dr. Rathoreat the time of the visit. We reviewed the resident's history and exam and pertinent patient test results. I agree with the assessment, diagnosis, and plan of care documented in the resident's note.  

## 2017-09-21 ENCOUNTER — Other Ambulatory Visit: Payer: Self-pay

## 2017-09-21 ENCOUNTER — Ambulatory Visit (INDEPENDENT_AMBULATORY_CARE_PROVIDER_SITE_OTHER): Payer: BLUE CROSS/BLUE SHIELD | Admitting: Internal Medicine

## 2017-09-21 ENCOUNTER — Encounter: Payer: Self-pay | Admitting: Internal Medicine

## 2017-09-21 VITALS — BP 131/77 | HR 89 | Temp 98.2°F | Ht 66.0 in | Wt 141.4 lb

## 2017-09-21 DIAGNOSIS — K432 Incisional hernia without obstruction or gangrene: Secondary | ICD-10-CM

## 2017-09-21 DIAGNOSIS — Z79899 Other long term (current) drug therapy: Secondary | ICD-10-CM | POA: Diagnosis not present

## 2017-09-21 DIAGNOSIS — K458 Other specified abdominal hernia without obstruction or gangrene: Secondary | ICD-10-CM | POA: Insufficient documentation

## 2017-09-21 DIAGNOSIS — I10 Essential (primary) hypertension: Secondary | ICD-10-CM | POA: Diagnosis not present

## 2017-09-21 DIAGNOSIS — R1013 Epigastric pain: Secondary | ICD-10-CM

## 2017-09-21 DIAGNOSIS — R0989 Other specified symptoms and signs involving the circulatory and respiratory systems: Secondary | ICD-10-CM

## 2017-09-21 DIAGNOSIS — R1900 Intra-abdominal and pelvic swelling, mass and lump, unspecified site: Secondary | ICD-10-CM | POA: Diagnosis not present

## 2017-09-21 HISTORY — DX: Other specified symptoms and signs involving the circulatory and respiratory systems: R09.89

## 2017-09-21 HISTORY — DX: Epigastric pain: R10.13

## 2017-09-21 MED ORDER — AMLODIPINE BESYLATE 10 MG PO TABS: ORAL_TABLET | ORAL | 0 refills | Status: DC

## 2017-09-21 NOTE — Assessment & Plan Note (Signed)
  HTN: Assessment: Patient's most recent blood pressure 131/77.  No adjustments are indicated. Plan: Refilled amlodipine 10 mg daily Continue lisinopril 40 mg daily

## 2017-09-21 NOTE — Patient Instructions (Signed)
FOLLOW-UP INSTRUCTIONS When: three months For: routine What to bring: yourself  I have placed a referral to general surgery for the abdominal hernia. In addition, we have placed an order to complete an abdominal US here at [facility].   Please be sure to follow-up with those tasks.  If at any time you were to develop abdominal pain, fever, chills, a mass in your abdomin that wont reduce, severe vomiting without bowel movements or other concerning symptoms please call us and VISIT the emergency department.  Thank you for you visit to [facility].

## 2017-09-21 NOTE — Progress Notes (Signed)
Medicine attending: I personally interviewed and briefly examined this patient on the day of the patient visit and reviewed pertinent clinical ,laboratory, and radiographic data  with resident physician Dr. Kathi Ludwig and we discussed a management plan. Ventral hernia intermittent abdominal discomfort. Easily reducible. No obstructive signs/sxs. Underlying easily palpable aortic pulse. Approx 4 cm diameter. We will get elective [place]. Pt advised to see general surgeon if hernia needs further eval/management.

## 2017-09-21 NOTE — Assessment & Plan Note (Signed)
Hernia: There is a central midepigastric abdominal hernia there is reproducible with Valsalva maneuver.  As per patient, she experienced significant associated pain surrounding the area of approximately 2-3 weeks that worsened significantly over time but initially it self resolved.  She denied notable fever although she was diaphoretic at that time.  The patient is amenable for evaluation with general surgery to establish care and consideration of possible intervention if indicated or if entrapment versus regulation of bowel occurs again in the future.

## 2017-09-21 NOTE — Assessment & Plan Note (Addendum)
Abdominal Aorta Screening: Upon evaluation the patient's hernia was noted that there is a palpable abdominal pulsatile mass that appeared approximately 4-5 cm in diameter.  This is concerning for AAA and as  such warrants initial evaluation with imaging.  Plan Have ordered complete abdominal ultrasound to evaluate for AAA and abdominal hernia.  Advised patient that if she were to experience fever, chills, vomiting, loss of bowel movements or flatulence, severe abdominal pain, with a central abdominal mass that is not reducible that she should visit the emergency department immediately.

## 2017-09-21 NOTE — Progress Notes (Signed)
   CC: Abdominal pain and HTN  HPI:[name] is a 52 y.o. female who presents today for evaluation of her blood pressure and abdominal mass. Please see individual problem based assessment and plan for details.  HTN: Assessment: Patient's most recent blood pressure 131/77.  No adjustments are indicated. Plan: Refilled amlodipine 10 mg daily Continue lisinopril 40 mg daily   Abdominal Aorta Screening: Upon evaluation the patient's hernia was noted that there is a palpable abdominal pulsatile mass that appeared approximately 4-5 cm in diameter.  This is concerning for AAA and as  such warrants initial evaluation with imaging.  Plan Have ordered complete abdominal ultrasound to evaluate for AAA and abdominal hernia.  Advised patient that if she were to experience fever, chills, vomiting, loss of bowel movements or flatulence, severe abdominal pain, with a central abdominal mass that is not reducible that she should visit the emergency department immediately.   Hernia: There is a central midepigastric abdominal hernia there is reproducible with Valsalva maneuver.  As per patient, she experienced significant associated pain surrounding the area of approximately 2-3 weeks that worsened significantly over time but initially it self resolved.  She denied notable fever although she was diaphoretic at that time.  The patient is amenable for evaluation with general surgery to establish care and consideration of possible intervention if indicated or if entrapment versus regulation of bowel occurs again in the future.   Past Medical History:  Diagnosis Date  . Allergy    seasonal  . Headache(784.0) 2008    improving  . Heart murmur    History of Heart murmur as child.  Marland Kitchen HLD (hyperlipidemia) 2008  . Hypertension   . Shoulder pain, right 07/2008   Review of Systems: ROS negative except as per HPI  Physical Exam:  Vitals:   09/21/17 1431  BP: 131/77  Pulse: 89  Temp: 98.2 F  (36.8 C)  TempSrc: Oral  SpO2: 100%  Weight: 141 lb 6.4 oz (64.1 kg)  Height: 5\' 6"  (1.676 m)   General: No acute distress, afebrile, nondiaphoretic, conversant Neuro: No acute focal deficits, alert and oriented x4 Cardiovascular: RRR, no murmurs rubs or gallops auscultated Pulmonary: Lungs clear to auscultation bilaterally GI: There is a palpable intra-abdominal mass approximate 4-5 cm in diameter concerning for abdominal aortic aneurysm.  In addition there is central midepigastric abdominal hernia that is reducible, nonerythematous, nontender to mild palpation  Assessment & Plan:   See Encounters Tab for problem based charting.  Patient discussed with Dr. Beryle Beams

## 2017-10-03 ENCOUNTER — Ambulatory Visit (HOSPITAL_COMMUNITY)
Admission: RE | Admit: 2017-10-03 | Discharge: 2017-10-03 | Disposition: A | Discharge status: Home / Self Care | Payer: BLUE CROSS/BLUE SHIELD | Source: Ambulatory Visit | Attending: Oncology | Admitting: Oncology

## 2017-10-03 DIAGNOSIS — K439 Ventral hernia without obstruction or gangrene: Secondary | ICD-10-CM | POA: Diagnosis not present

## 2017-10-03 DIAGNOSIS — R0989 Other specified symptoms and signs involving the circulatory and respiratory systems: Secondary | ICD-10-CM | POA: Diagnosis not present

## 2017-10-03 DIAGNOSIS — R1013 Epigastric pain: Secondary | ICD-10-CM

## 2017-10-12 ENCOUNTER — Ambulatory Visit: Payer: Self-pay | Admitting: General Surgery

## 2017-10-12 DIAGNOSIS — K439 Ventral hernia without obstruction or gangrene: Secondary | ICD-10-CM | POA: Diagnosis not present

## 2017-10-20 ENCOUNTER — Other Ambulatory Visit: Payer: Self-pay

## 2017-10-20 ENCOUNTER — Encounter (HOSPITAL_BASED_OUTPATIENT_CLINIC_OR_DEPARTMENT_OTHER): Payer: Self-pay

## 2017-10-20 NOTE — Progress Notes (Signed)
Pre surgery drink at 5:45AM and hibiclens night before surgery and morning of surgery.

## 2017-10-20 NOTE — Progress Notes (Signed)
Spoke with:  [place] NPO:  After Midnight, no gum, candy, or mints   Arrival time:  [id number] Labs/procedures: Istat8,EKG AM medications:  Amlodipine Pre op orders: Yes Ride home:  Carloyn Manner (Boyfriend) [phone]

## 2017-10-25 ENCOUNTER — Ambulatory Visit (HOSPITAL_BASED_OUTPATIENT_CLINIC_OR_DEPARTMENT_OTHER): Payer: BLUE CROSS/BLUE SHIELD | Admitting: Certified Registered Nurse Anesthetist

## 2017-10-25 ENCOUNTER — Ambulatory Visit (HOSPITAL_BASED_OUTPATIENT_CLINIC_OR_DEPARTMENT_OTHER)
Admission: RE | Admit: 2017-10-25 | Discharge: 2017-10-25 | Disposition: A | Discharge status: Home / Self Care | Payer: BLUE CROSS/BLUE SHIELD | Source: Ambulatory Visit | Attending: General Surgery | Admitting: General Surgery

## 2017-10-25 ENCOUNTER — Encounter (HOSPITAL_BASED_OUTPATIENT_CLINIC_OR_DEPARTMENT_OTHER): Payer: Self-pay

## 2017-10-25 ENCOUNTER — Encounter (HOSPITAL_BASED_OUTPATIENT_CLINIC_OR_DEPARTMENT_OTHER)
Admission: RE | Disposition: A | Discharge status: Home / Self Care | Payer: Self-pay | Source: Ambulatory Visit | Attending: General Surgery

## 2017-10-25 DIAGNOSIS — Z79899 Other long term (current) drug therapy: Secondary | ICD-10-CM | POA: Insufficient documentation

## 2017-10-25 DIAGNOSIS — F17210 Nicotine dependence, cigarettes, uncomplicated: Secondary | ICD-10-CM | POA: Insufficient documentation

## 2017-10-25 DIAGNOSIS — G5600 Carpal tunnel syndrome, unspecified upper limb: Secondary | ICD-10-CM | POA: Diagnosis not present

## 2017-10-25 DIAGNOSIS — I10 Essential (primary) hypertension: Secondary | ICD-10-CM | POA: Insufficient documentation

## 2017-10-25 DIAGNOSIS — K439 Ventral hernia without obstruction or gangrene: Secondary | ICD-10-CM | POA: Diagnosis not present

## 2017-10-25 DIAGNOSIS — M5030 Other cervical disc degeneration, unspecified cervical region: Secondary | ICD-10-CM | POA: Diagnosis not present

## 2017-10-25 DIAGNOSIS — E785 Hyperlipidemia, unspecified: Secondary | ICD-10-CM | POA: Diagnosis not present

## 2017-10-25 DIAGNOSIS — K5900 Constipation, unspecified: Secondary | ICD-10-CM | POA: Diagnosis not present

## 2017-10-25 HISTORY — DX: Furuncle, unspecified: L02.92

## 2017-10-25 HISTORY — DX: Spondylosis without myelopathy or radiculopathy, cervical region: M47.812

## 2017-10-25 HISTORY — DX: Unspecified ovarian cyst, right side: N83.201

## 2017-10-25 HISTORY — DX: Other cervical disc degeneration, unspecified cervical region: M50.30

## 2017-10-25 HISTORY — DX: Excessive and frequent menstruation with regular cycle: N92.0

## 2017-10-25 HISTORY — DX: Carpal tunnel syndrome, left upper limb: G56.02

## 2017-10-25 HISTORY — DX: Constipation, unspecified: K59.00

## 2017-10-25 HISTORY — PX: INSERTION OF MESH: SHX5868

## 2017-10-25 HISTORY — DX: Radial styloid tenosynovitis (de quervain): M65.4

## 2017-10-25 HISTORY — DX: Unspecified convulsions: R56.9

## 2017-10-25 HISTORY — DX: Heartburn: R12

## 2017-10-25 HISTORY — DX: Palpitations: R00.2

## 2017-10-25 HISTORY — DX: Ventral hernia without obstruction or gangrene: K43.9

## 2017-10-25 HISTORY — DX: Pain in right hip: M25.551

## 2017-10-25 HISTORY — PX: VENTRAL HERNIA REPAIR: SHX424

## 2017-10-25 HISTORY — DX: Headache: R51

## 2017-10-25 SURGERY — REPAIR, HERNIA, VENTRAL, LAPAROSCOPIC
Anesthesia: General

## 2017-10-25 MED ORDER — HYDROCODONE-ACETAMINOPHEN 5-325 MG PO TABS
ORAL_TABLET | ORAL | Status: AC
  Filled 2017-10-25: qty 1

## 2017-10-25 MED ORDER — MIDAZOLAM HCL 2 MG/2ML IJ SOLN
INTRAMUSCULAR | Status: AC
  Filled 2017-10-25: qty 2

## 2017-10-25 MED ORDER — SUGAMMADEX SODIUM 200 MG/2ML IV SOLN
INTRAVENOUS | Status: AC
  Filled 2017-10-25: qty 2

## 2017-10-25 MED ORDER — EPHEDRINE SULFATE-NACL 50-0.9 MG/10ML-% IV SOSY
PREFILLED_SYRINGE | INTRAVENOUS | Status: DC | PRN
  Administered 2017-10-25 (×4): 10 mg via INTRAVENOUS

## 2017-10-25 MED ORDER — ACETAMINOPHEN 500 MG PO TABS
1000.0000 mg | ORAL_TABLET | ORAL | Status: AC
  Administered 2017-10-25: 1000 mg via ORAL
  Filled 2017-10-25: qty 2

## 2017-10-25 MED ORDER — GABAPENTIN 300 MG PO CAPS
300.0000 mg | ORAL_CAPSULE | ORAL | Status: AC
  Administered 2017-10-25: 300 mg via ORAL
  Filled 2017-10-25: qty 1

## 2017-10-25 MED ORDER — KETOROLAC TROMETHAMINE 30 MG/ML IJ SOLN
INTRAMUSCULAR | Status: AC
  Filled 2017-10-25: qty 1

## 2017-10-25 MED ORDER — ONDANSETRON HCL 4 MG/2ML IJ SOLN
INTRAMUSCULAR | Status: AC
  Filled 2017-10-25: qty 2

## 2017-10-25 MED ORDER — MIDAZOLAM HCL 2 MG/2ML IJ SOLN
0.5000 mg | Freq: Once | INTRAMUSCULAR | Status: DC | PRN
  Filled 2017-10-25: qty 2

## 2017-10-25 MED ORDER — ENSURE PRE-SURGERY PO LIQD
296.0000 mL | Freq: Once | ORAL | Status: DC
  Filled 2017-10-25: qty 296

## 2017-10-25 MED ORDER — CELECOXIB 400 MG PO CAPS
400.0000 mg | ORAL_CAPSULE | ORAL | Status: AC
  Administered 2017-10-25: 400 mg via ORAL
  Filled 2017-10-25: qty 1

## 2017-10-25 MED ORDER — BUPIVACAINE HCL 0.5 % IJ SOLN: INTRAMUSCULAR | Status: DC | PRN

## 2017-10-25 MED ORDER — PROMETHAZINE HCL 25 MG/ML IJ SOLN
6.2500 mg | INTRAMUSCULAR | Status: DC | PRN
  Filled 2017-10-25: qty 1

## 2017-10-25 MED ORDER — CIPROFLOXACIN IN D5W 400 MG/200ML IV SOLN
400.0000 mg | INTRAVENOUS | Status: AC
  Administered 2017-10-25: 400 mg via INTRAVENOUS
  Filled 2017-10-25: qty 200

## 2017-10-25 MED ORDER — HYDROCODONE-ACETAMINOPHEN 5-325 MG PO TABS
1.0000 | ORAL_TABLET | Freq: Four times a day (QID) | ORAL | Status: DC | PRN
  Filled 2017-10-25: qty 1

## 2017-10-25 MED ORDER — CHLORHEXIDINE GLUCONATE 4 % EX LIQD
60.0000 mL | Freq: Once | CUTANEOUS | Status: DC
  Filled 2017-10-25: qty 118

## 2017-10-25 MED ORDER — FENTANYL CITRATE (PF) 250 MCG/5ML IJ SOLN
INTRAMUSCULAR | Status: AC
  Filled 2017-10-25: qty 5

## 2017-10-25 MED ORDER — SUGAMMADEX SODIUM 200 MG/2ML IV SOLN
INTRAVENOUS | Status: DC | PRN
  Administered 2017-10-25: 125 mg via INTRAVENOUS

## 2017-10-25 MED ORDER — ACETAMINOPHEN 500 MG PO TABS
ORAL_TABLET | ORAL | Status: AC
  Filled 2017-10-25: qty 2

## 2017-10-25 MED ORDER — DEXAMETHASONE SODIUM PHOSPHATE 4 MG/ML IJ SOLN
INTRAMUSCULAR | Status: DC | PRN
  Administered 2017-10-25: 5 mg via INTRAVENOUS

## 2017-10-25 MED ORDER — GABAPENTIN 300 MG PO CAPS
ORAL_CAPSULE | ORAL | Status: AC
  Filled 2017-10-25: qty 1

## 2017-10-25 MED ORDER — IBUPROFEN 800 MG PO TABS: 800.0000 mg | ORAL_TABLET | Freq: Three times a day (TID) | ORAL | 0 refills | Status: DC | PRN

## 2017-10-25 MED ORDER — CELECOXIB 200 MG PO CAPS
ORAL_CAPSULE | ORAL | Status: AC
  Filled 2017-10-25: qty 2

## 2017-10-25 MED ORDER — HYDROMORPHONE HCL 1 MG/ML IJ SOLN
0.2500 mg | INTRAMUSCULAR | Status: DC | PRN
  Administered 2017-10-25: 0.5 mg via INTRAVENOUS
  Filled 2017-10-25: qty 0.5

## 2017-10-25 MED ORDER — LACTATED RINGERS IV SOLN
INTRAVENOUS | Status: DC
  Administered 2017-10-25 (×2): via INTRAVENOUS
  Filled 2017-10-25: qty 1000

## 2017-10-25 MED ORDER — FENTANYL CITRATE (PF) 100 MCG/2ML IJ SOLN
INTRAMUSCULAR | Status: DC | PRN
  Administered 2017-10-25: 50 ug via INTRAVENOUS
  Administered 2017-10-25: 100 ug via INTRAVENOUS

## 2017-10-25 MED ORDER — HYDROMORPHONE HCL 1 MG/ML IJ SOLN
INTRAMUSCULAR | Status: AC
  Filled 2017-10-25: qty 1

## 2017-10-25 MED ORDER — ROCURONIUM BROMIDE 100 MG/10ML IV SOLN
INTRAVENOUS | Status: DC | PRN
  Administered 2017-10-25: 50 mg via INTRAVENOUS

## 2017-10-25 MED ORDER — LIDOCAINE 2% (20 MG/ML) 5 ML SYRINGE
INTRAMUSCULAR | Status: AC
  Filled 2017-10-25: qty 5

## 2017-10-25 MED ORDER — BUPIVACAINE HCL (PF) 0.5 % IJ SOLN
INTRAMUSCULAR | Status: DC | PRN
  Administered 2017-10-25: 50 mL

## 2017-10-25 MED ORDER — PROPOFOL 10 MG/ML IV BOLUS
INTRAVENOUS | Status: DC | PRN
  Administered 2017-10-25: 150 mg via INTRAVENOUS

## 2017-10-25 MED ORDER — ROCURONIUM BROMIDE 10 MG/ML (PF) SYRINGE
PREFILLED_SYRINGE | INTRAVENOUS | Status: AC
  Filled 2017-10-25: qty 5

## 2017-10-25 MED ORDER — CIPROFLOXACIN IN D5W 400 MG/200ML IV SOLN
INTRAVENOUS | Status: AC
  Filled 2017-10-25: qty 200

## 2017-10-25 MED ORDER — MEPERIDINE HCL 25 MG/ML IJ SOLN
6.2500 mg | INTRAMUSCULAR | Status: DC | PRN
  Filled 2017-10-25: qty 1

## 2017-10-25 MED ORDER — HYDROCODONE-ACETAMINOPHEN 5-325 MG PO TABS: 1.0000 | ORAL_TABLET | Freq: Four times a day (QID) | ORAL | 0 refills | Status: DC | PRN

## 2017-10-25 MED ORDER — MIDAZOLAM HCL 5 MG/5ML IJ SOLN
INTRAMUSCULAR | Status: DC | PRN
  Administered 2017-10-25: 1 mg via INTRAVENOUS

## 2017-10-25 MED ORDER — PROPOFOL 10 MG/ML IV BOLUS
INTRAVENOUS | Status: AC
  Filled 2017-10-25: qty 20

## 2017-10-25 MED ORDER — LIDOCAINE HCL (CARDIAC) PF 100 MG/5ML IV SOSY
PREFILLED_SYRINGE | INTRAVENOUS | Status: DC | PRN
  Administered 2017-10-25: 60 mg via INTRAVENOUS

## 2017-10-25 MED ORDER — ONDANSETRON HCL 4 MG/2ML IJ SOLN
INTRAMUSCULAR | Status: DC | PRN
  Administered 2017-10-25: 4 mg via INTRAVENOUS

## 2017-10-25 SURGICAL SUPPLY — 36 items
BLADE 11 SAFETY STRL DISP (BLADE) IMPLANT
BLADE CLIPPER SENSICLIP SURGIC (BLADE) IMPLANT
CHLORAPREP W/TINT 26ML (MISCELLANEOUS) ×2 IMPLANT
DECANTER SPIKE VIAL GLASS SM (MISCELLANEOUS) IMPLANT
DERMABOND ADVANCED (GAUZE/BANDAGES/DRESSINGS) ×3
DERMABOND ADVANCED .7 DNX12 (GAUZE/BANDAGES/DRESSINGS) ×3 IMPLANT
DEVICE PMI PUNCTURE CLOSURE (MISCELLANEOUS) ×2 IMPLANT
DEVICE SECURE STRAP 25 ABSORB (INSTRUMENTS) ×2 IMPLANT
DISSECT BALLN SPACEMKR + OVL (BALLOONS)
DISSECTOR BALLN SPACEMKR + OVL (BALLOONS) IMPLANT
DRAPE LAPAROSCOPIC ABDOMINAL (DRAPES) ×2 IMPLANT
ELECT REM PT RETURN 9FT ADLT (ELECTROSURGICAL) ×2
ELECTRODE REM PT RTRN 9FT ADLT (ELECTROSURGICAL) ×1 IMPLANT
GLOVE BIOGEL PI IND STRL 7.0 (GLOVE) ×1 IMPLANT
GLOVE BIOGEL PI INDICATOR 7.0 (GLOVE) ×1
GLOVE SURG SS PI 7.0 STRL IVOR (GLOVE) ×2 IMPLANT
GOWN STRL REUS W/TWL LRG LVL3 (GOWN DISPOSABLE) ×2 IMPLANT
MESH VENTRALIGHT ST 4X6IN (Mesh General) ×2 IMPLANT
PACK BASIN DAY SURGERY FS (CUSTOM PROCEDURE TRAY) ×2 IMPLANT
PADDING ION DISPOSABLE (MISCELLANEOUS) IMPLANT
SCISSORS LAP 5X35 DISP (ENDOMECHANICALS) IMPLANT
SET IRRIG TUBING LAPAROSCOPIC (IRRIGATION / IRRIGATOR) IMPLANT
SHEARS HARMONIC ACE PLUS 36CM (ENDOMECHANICALS) IMPLANT
SOLUTION ANTI FOG 6CC (MISCELLANEOUS) IMPLANT
SUT MNCRL AB 4-0 PS2 18 (SUTURE) ×2 IMPLANT
SUT PDS AB 0 CT 36 (SUTURE) ×4 IMPLANT
SUT PDS AB 0 CT1 36 (SUTURE) ×2 IMPLANT
SUT PROLENE 0 CT 1 CR/8 (SUTURE) ×2 IMPLANT
SUT VIC AB 2-0 SH 27 (SUTURE)
SUT VIC AB 2-0 SH 27X BRD (SUTURE) IMPLANT
SUT VICRYL 0 UR6 27IN ABS (SUTURE) ×2 IMPLANT
TOWEL OR 17X24 6PK STRL BLUE (TOWEL DISPOSABLE) ×4 IMPLANT
TRAY LAPAROSCOPIC (CUSTOM PROCEDURE TRAY) ×2 IMPLANT
TROCAR XCEL NON-BLD 11X100MML (ENDOMECHANICALS) ×2 IMPLANT
TROCAR XCEL NON-BLD 5MMX100MML (ENDOMECHANICALS) ×4 IMPLANT
TUBING INSUF HEATED (TUBING) ×2 IMPLANT

## 2017-10-25 NOTE — Anesthesia Procedure Notes (Signed)
Procedure Name: Intubation Date/Time: 10/25/2017 9:50 AM Performed by: Bufford Spikes, CRNA Pre-anesthesia Checklist: Patient identified, Emergency Drugs available, Suction available and Patient being monitored Patient Re-evaluated:Patient Re-evaluated prior to induction Oxygen Delivery Method: Circle system utilized Preoxygenation: Pre-oxygenation with 100% oxygen Induction Type: IV induction Ventilation: Mask ventilation without difficulty Laryngoscope Size: Miller and 2 Grade View: Grade I Tube type: Oral Tube size: 7.0 mm Number of attempts: 1 Airway Equipment and Method: Stylet and Oral airway Placement Confirmation: ETT inserted through vocal cords under direct vision,  positive ETCO2 and breath sounds checked- equal and bilateral Secured at: 22 cm Tube secured with: Tape Dental Injury: Teeth and Oropharynx as per pre-operative assessment

## 2017-10-25 NOTE — Op Note (Signed)
Preoperative diagnosis: epigastric hernia without obstruction or gangrene  Postoperative diagnosis: Same   Procedure: laparoscopic ventral hernia repair with mesh  Surgeon: Gurney Maxin, M.D.  Asst: none  Anesthesia: Gen.   Indications for procedure: Lauren [name] is a 52 y.o. female with symptoms of abdominal pain and hernia reducible on exam.   Description of procedure: The patient was brought into the operative suite, placed supine. Anesthesia was administered with endotracheal tube. Patient was strapped in place and foot board was secured. Both arms were tucked. All pressure points were offloaded by foam padding. The patient was prepped and draped in the usual sterile fashion.  A small incision was made over the left subcostal area and 100mm trocar was placed with optical entry. Pneumoperitoneum was applied with high flow low pressure. The abdominal cavity was inspected and there was a 2-3cm defect at the origin of the falciform ligament.  The left and right lateral spaces were injected with Marcaine:Exparel mix in the TAP plane. 1 39mm trocar was placed in the mid left abdomen and 3 additional 50mm trocars 1 in the LLQ.All trocars were placed under direct vision.  Blunt dissection was used to remove most of the filmy adhesions with occasional sharp dissection. Cautery was used to provide hemostasis. The hernia was identified and was filled with a small tongue of omentum. It was slowly dissected free and reduced. The defect was about 3 cm in diameter. The falciform ligament was dissected free of the abdominal wall to allow the mesh to sit apposed to the abdominal wall. 2 0 PDS were used to close the defect. A 10 x 15cm ventralight mesh was inserted and used to the repair the mesh. 8 transfascial 0 prolene sutures were used to secure the mesh in place and absorbable tackers were used to appose the mesh against the abdominal wall in all areas.  The abdominal contents were again inspected and  hemostasis was intact.  0 vicryl was used to close the fascial defect of the 80mm trocar site using suture passer. Pneumoperitoneum was removed, all trocar were removed. All incisions were closed with 4-0 monocryl subcuticular stitch. The patient woke from anesthesia and was brought to PACU in stable condition.  Findings: 3cm defect at origin of falciform  Specimen: none  Blood loss: 30 ml  Local anesthesia: 50 ml BBCWUGQ:9.1% Marcaine  Complications: none  Implant: 10 x 15cm ventralight ST mesh  Images:       Gurney Maxin, M.D. General, Bariatric, & Minimally Invasive Surgery [facility]

## 2017-10-25 NOTE — H&P (Signed)
Lauren [name] is an 52 y.o. female.   Chief Complaint: hernia HPI: 52 yo female with abdominal pain and increasing bulge in her epigastrium. She denies nausea or vomiting associated. She does have some constipation.  Past Medical History:  Diagnosis Date  . Allergy    seasonal  . Boils 12/15/2010   multiple axilla  . Carpal tunnel syndrome of left wrist   . Chronic headaches 2008    improving  . Constipation   . DDD (degenerative disc disease), cervical   . De Quervain's disease (tenosynovitis)    Right per pt  . Heart murmur    History of Heart murmur as child.  Marland Kitchen Heart palpitations   . Heartburn    Occ  . HLD (hyperlipidemia) 2008  . Hypertension   . Menorrhagia   . OA (osteoarthritis) of neck   . Right hip pain   . Right ovarian cyst 11/16/2009   2 small, noted on pelvic US  . Seizures ([place])    history of one time  . Shoulder pain, right 07/2008  . Ventral hernia     Past Surgical History:  Procedure Laterality Date  . COLONOSCOPY W/ POLYPECTOMY  12/24/2015    Family History  Problem Relation Age of Onset  . Heart disease Mother   . Breast cancer Neg Hx    Social History:  reports that she has been smoking cigarettes.  She has a 18.00 pack-year smoking history. She has never used smokeless tobacco. She reports that she drinks alcohol. She reports that she has current or past drug history. Drug: Marijuana.  Allergies:  Allergies  Allergen Reactions  . Other Other (See Comments)    Banana - Swelling and hives.  . Penicillins     REACTION: Hives and swelling    Medications Prior to Admission  Medication Sig Dispense Refill  . amLODipine (NORVASC) 10 MG tablet TAKE 1 TABLET(10 MG) BY MOUTH DAILY 90 tablet 0  . lisinopril (PRINIVIL,ZESTRIL) 40 MG tablet TAKE 1 TABLET(40 MG) BY MOUTH DAILY 90 tablet 3  . Multiple Vitamins-Minerals (CENTRUM SILVER 50+WOMEN PO) Take by mouth.    . pravastatin (PRAVACHOL) 40 MG tablet Take 1 tablet (40 mg total) by mouth  daily. (Patient taking differently: Take 40 mg by mouth every evening. ) 90 tablet 3  . polyethylene glycol powder (GLYCOLAX/MIRALAX) powder Take 17 g by mouth once. (Patient taking differently: Take 17 g by mouth as needed. ) 255 g 2    No results found for this or any previous visit (from the past 48 hour(s)). No results found.  Review of Systems  Constitutional: Negative for chills and fever.  HENT: Negative for hearing loss.   Eyes: Negative for blurred vision and double vision.  Respiratory: Negative for cough and hemoptysis.   Cardiovascular: Negative for chest pain and palpitations.  Gastrointestinal: Negative for abdominal pain, nausea and vomiting.  Genitourinary: Negative for dysuria and urgency.  Musculoskeletal: Negative for myalgias and neck pain.  Skin: Negative for itching and rash.  Neurological: Negative for dizziness, tingling and headaches.  Endo/Heme/Allergies: Does not bruise/bleed easily.  Psychiatric/Behavioral: Negative for depression and suicidal ideas.    Blood pressure 118/81, pulse 81, temperature 98.6 F (37 C), temperature source Oral, resp. rate 16, height 5\' 6"  (1.676 m), weight 63.8 kg (140 lb 9.6 oz), last menstrual period 08/09/2012, SpO2 100 %. Physical Exam  Vitals reviewed. Constitutional: She is oriented to person, place, and time. She appears well-developed and well-nourished.  HENT:  Head: Normocephalic  and atraumatic.  Eyes: Pupils are equal, round, and reactive to light. Conjunctivae and EOM are normal.  Neck: Normal range of motion. Neck supple.  Cardiovascular: Normal rate and regular rhythm.  Respiratory: Effort normal and breath sounds normal.  GI: Soft. Bowel sounds are normal. She exhibits no distension. There is no tenderness.  Epigastric hernia  Musculoskeletal: Normal range of motion.  Neurological: She is alert and oriented to person, place, and time.  Skin: Skin is warm and dry.  Psychiatric: She has a normal mood and affect.  Her behavior is normal.     Assessment/Plan 52 yo female with ventral hernia -lap ventral hernia repair -planned outpatient procedure -enhanced recovery protocol  Mickeal Skinner, MD 10/25/2017, 8:30 AM

## 2017-10-25 NOTE — Anesthesia Postprocedure Evaluation (Signed)
Anesthesia Post Note  Patient: Network engineer  Procedure(s) Performed: LAPAROSCOPIC VENTRAL HERNIA (N/A ) INSERTION OF MESH (N/A )     Patient location during evaluation: PACU Anesthesia Type: General Level of consciousness: awake and alert, oriented and patient cooperative Pain management: pain level controlled Vital Signs Assessment: post-procedure vital signs reviewed and stable Respiratory status: spontaneous breathing, nonlabored ventilation and respiratory function stable Cardiovascular status: blood pressure returned to baseline and stable Postop Assessment: no apparent nausea or vomiting Anesthetic complications: no    Last Vitals:  Vitals:   10/25/17 1130 10/25/17 1329  BP: 130/82 119/75  Pulse: 97 81  Resp: 17 19  Temp:  36.5 C  SpO2: 97% 99%    Last Pain:  Vitals:   10/25/17 1329  TempSrc: Oral  PainSc:                  Lauren [name],Lauren [name]

## 2017-10-25 NOTE — Discharge Instructions (Signed)

## 2017-10-25 NOTE — Transfer of Care (Signed)
Immediate Anesthesia Transfer of Care Note  Patient: Lauren [name]  Procedure(s) Performed: LAPAROSCOPIC VENTRAL HERNIA (N/A ) INSERTION OF MESH (N/A )  Patient Location: PACU  Anesthesia Type:General  Level of Consciousness: alert, oriented  Airway & Oxygen Therapy: Patient Spontanous Breathing and Patient connected to nasal cannula oxygen  Post-op Assessment: Report given to RN  Post vital signs: Reviewed and stable  Last Vitals:  Vitals Value Taken Time  BP 135/85 10/25/2017 11:11 AM  Temp 36.3 C 10/25/2017 11:11 AM  Pulse 82 10/25/2017 11:17 AM  Resp 15 10/25/2017 11:17 AM  SpO2 98 % 10/25/2017 11:17 AM  Vitals shown include unvalidated device data.  Last Pain:  Vitals:   10/25/17 0752  TempSrc:   PainSc: 8       Patients Stated Pain Goal: 6 (10/30/31 5825)  Complications: No apparent anesthesia complications

## 2017-10-25 NOTE — Anesthesia Preprocedure Evaluation (Addendum)
Anesthesia Evaluation  Patient identified by MRN, date of birth, ID band Patient awake    Reviewed: Allergy & Precautions, NPO status , Patient's Chart, lab work & pertinent test results  History of Anesthesia Complications Negative for: history of anesthetic complications  Airway Mallampati: II  TM Distance: >3 FB Neck ROM: Full    Dental  (+) Dental Advisory Given   Pulmonary Current Smoker,    breath sounds clear to auscultation       Cardiovascular hypertension, Pt. on medications (-) angina Rhythm:Regular Rate:Normal     Neuro/Psych  Headaches, Seizures - (single seizure),     GI/Hepatic negative GI ROS, (+)     substance abuse  marijuana use,   Endo/Other  negative endocrine ROS  Renal/GU negative Renal ROS     Musculoskeletal  (+) Arthritis ,   Abdominal   Peds  Hematology negative hematology ROS (+)   Anesthesia Other Findings   Reproductive/Obstetrics                            Anesthesia Physical Anesthesia Plan  ASA: II  Anesthesia Plan: General   Post-op Pain Management:    Induction: Intravenous  PONV Risk Score and Plan: 3 and Ondansetron, Dexamethasone and Scopolamine patch - Pre-op  Airway Management Planned:   Additional Equipment:   Intra-op Plan:   Post-operative Plan: Extubation in OR  Informed Consent: I have reviewed the patients History and Physical, chart, labs and discussed the procedure including the risks, benefits and alternatives for the proposed anesthesia with the patient or authorized representative who has indicated his/her understanding and acceptance.   Dental advisory given  Plan Discussed with: CRNA and Surgeon  Anesthesia Plan Comments: (Plan routine monitors, GETA)        Anesthesia Quick Evaluation

## 2017-10-26 ENCOUNTER — Encounter (HOSPITAL_BASED_OUTPATIENT_CLINIC_OR_DEPARTMENT_OTHER): Payer: Self-pay | Admitting: General Surgery

## 2017-10-31 ENCOUNTER — Other Ambulatory Visit: Payer: Self-pay | Admitting: Internal Medicine

## 2017-12-25 ENCOUNTER — Other Ambulatory Visit: Payer: Self-pay | Admitting: Internal Medicine

## 2017-12-25 DIAGNOSIS — I10 Essential (primary) hypertension: Secondary | ICD-10-CM

## 2017-12-28 ENCOUNTER — Other Ambulatory Visit: Payer: Self-pay

## 2017-12-28 ENCOUNTER — Encounter: Payer: Self-pay | Admitting: Internal Medicine

## 2017-12-28 ENCOUNTER — Ambulatory Visit (INDEPENDENT_AMBULATORY_CARE_PROVIDER_SITE_OTHER): Payer: BLUE CROSS/BLUE SHIELD | Admitting: Internal Medicine

## 2017-12-28 VITALS — BP 120/78 | HR 77 | Temp 98.9°F | Wt 137.3 lb

## 2017-12-28 DIAGNOSIS — R634 Abnormal weight loss: Secondary | ICD-10-CM

## 2017-12-28 DIAGNOSIS — Z6822 Body mass index (BMI) 22.0-22.9, adult: Secondary | ICD-10-CM

## 2017-12-28 DIAGNOSIS — F17200 Nicotine dependence, unspecified, uncomplicated: Secondary | ICD-10-CM

## 2017-12-28 DIAGNOSIS — I10 Essential (primary) hypertension: Secondary | ICD-10-CM

## 2017-12-28 DIAGNOSIS — Z23 Encounter for immunization: Secondary | ICD-10-CM | POA: Diagnosis not present

## 2017-12-28 DIAGNOSIS — Z8719 Personal history of other diseases of the digestive system: Secondary | ICD-10-CM

## 2017-12-28 DIAGNOSIS — Z79899 Other long term (current) drug therapy: Secondary | ICD-10-CM

## 2017-12-28 DIAGNOSIS — Z0000 Encounter for general adult medical examination without abnormal findings: Secondary | ICD-10-CM

## 2017-12-28 DIAGNOSIS — K458 Other specified abdominal hernia without obstruction or gangrene: Secondary | ICD-10-CM

## 2017-12-28 MED ORDER — AMLODIPINE BESYLATE 10 MG PO TABS: 10.0000 mg | ORAL_TABLET | Freq: Every day | ORAL | 3 refills | Status: DC

## 2017-12-28 NOTE — Progress Notes (Signed)
Medicine attending: Medical history, presenting problems, physical findings, and medications, reviewed with resident physician Dr Lawrence Harbrecht on the day of the patient visit and I concur with his evaluation and management plan. 

## 2017-12-28 NOTE — Progress Notes (Signed)
CC: Routine visit for HTN and health maintenance   HPI:Lauren [name] is a 52 y.o. female who presents today for routine evaluation of her HTN.   Essential HTN: Patient BP 120/78 with her prior at 131/77. Denied lightheadedness, weakness, fever, chills, headache or chest pain.  Plan: Continue lisinopril 40mg  daily Continue amlodipine 10mg  daily  Weight Loss: The patient has experienced notable weight loss in the past four years from 190lbs to 137lbs of which ~14lbs was lost in the past year. This weight loss is unintentional but not unexpected as she has decreased her oral intake significantly with her new job. She described eating only snack sized meals for lunch and dinner. She denied nausea, vomiting, acid reflux, early satiety, diarrhea, constipation, abdominal pain, chest pain, cough, hematuria, hematochezia, dark tarry stools, myalgias, or other specific concerns. She stated that she simply eats less than she once did but denied feeling that food was repulsive.   Plan: CXR AP lateral due to smoking history ordered.  CBC, CMP, TSH all unremarkable as below Colonoscopy 2017, cervical 2018, and breast 2018, cancer screening negative We will follow-up on the results above and continue to trend her weight.   CBC    Component Value Date/Time   WBC 10.8 12/28/2017 1614   WBC 11.0 (H) 07/22/2010 1540   RBC 3.83 12/28/2017 1614   RBC 3.90 07/22/2010 1540   HGB 11.5 12/28/2017 1614   HCT 34.8 12/28/2017 1614   PLT 337 12/28/2017 1614   MCV 91 12/28/2017 1614   MCH 30.0 12/28/2017 1614   MCH 29.2 07/22/2010 1540   MCHC 33.0 12/28/2017 1614   MCHC 31.8 07/22/2010 1540   RDW 13.0 12/28/2017 1614   LYMPHSABS 3.4 (H) 12/28/2017 1614   MONOABS 0.6 01/24/2009 0815   EOSABS 0.4 12/28/2017 1614   BASOSABS 0.1 12/28/2017 1614   CMP     Component Value Date/Time   NA 138 12/28/2017 1614   K 4.2 12/28/2017 1614   CL 100 12/28/2017 1614   CO2 24 12/28/2017 1614   GLUCOSE 88  12/28/2017 1614   GLUCOSE 87 06/01/2013 1120   BUN 10 12/28/2017 1614   CREATININE 0.55 (L) 12/28/2017 1614   CREATININE 0.76 06/01/2013 1120   CALCIUM 9.5 12/28/2017 1614   PROT 7.8 12/28/2017 1614   ALBUMIN 4.3 12/28/2017 1614   AST 17 12/28/2017 1614   ALT 21 12/28/2017 1614   ALKPHOS 58 12/28/2017 1614   BILITOT 0.2 12/28/2017 1614   GFRNONAA 108 12/28/2017 1614   GFRNONAA >89 06/01/2013 1120   GFRAA 125 12/28/2017 1614   GFRAA >89 06/01/2013 1120   TSH-1.170  Hernia: Surgically repaired. No acute concerns. Healing well.   Health Maintenance screening: TDAP given today.  Up to date on colon cancer screening, mammogram and pap smear.   Past Medical History:  Diagnosis Date  . Allergy    seasonal  . Boils 12/15/2010   multiple axilla  . Carpal tunnel syndrome of left wrist   . Chronic headaches 2008    improving  . Constipation   . DDD (degenerative disc disease), cervical   . De Quervain's disease (tenosynovitis)    Right per pt  . Heart murmur    History of Heart murmur as child.  Marland Kitchen Heart palpitations   . Heartburn    Occ  . HLD (hyperlipidemia) 2008  . Hypertension   . Menorrhagia   . OA (osteoarthritis) of neck   . Right hip pain   . Right ovarian  cyst 11/16/2009   2 small, noted on pelvic US  . Seizures ([place])    history of one time  . Shoulder pain, right 07/2008  . Ventral hernia    Review of Systems:  ROS negative except as per HPI.  Physical Exam:  Vitals:   12/28/17 1454  BP: 120/78  Pulse: 77  Temp: 98.9 F (37.2 C)  TempSrc: Oral  SpO2: 100%  Weight: 137 lb 4.8 oz (62.3 kg)   Physical Exam  Constitutional: She appears well-developed and well-nourished. No distress.  Cardiovascular: Normal rate and regular rhythm.  No murmur heard. Pulmonary/Chest: Effort normal and breath sounds normal. No stridor. No respiratory distress.  Abdominal: Soft. Bowel sounds are normal. She exhibits no distension. There is no tenderness. No hernia.    Musculoskeletal: She exhibits no edema or tenderness.  Skin: She is not diaphoretic.  Vitals reviewed.  Assessment & Plan:   See Encounters Tab for problem based charting.  Patient discussed with Dr. Beryle Beams

## 2017-12-28 NOTE — Patient Instructions (Addendum)
FOLLOW-UP INSTRUCTIONS When: In six months For: Routine visit What to bring: All of you medications  Thank you for your visit to the [facility]. It was nice to see you again today. If you have any questions or concerns please feel free to contact us at any time.  We have given you the TDAP vaccine today.

## 2017-12-29 LAB — CBC WITH DIFFERENTIAL/PLATELET
Basophils Absolute: 0.1 10*3/uL (ref 0.0–0.2)
Basos: 1 %
EOS (ABSOLUTE): 0.4 10*3/uL (ref 0.0–0.4)
Eos: 4 %
Hematocrit: 34.8 % (ref 34.0–46.6)
Hemoglobin: 11.5 g/dL (ref 11.1–15.9)
Immature Grans (Abs): 0 10*3/uL (ref 0.0–0.1)
Immature Granulocytes: 0 %
Lymphocytes Absolute: 3.4 10*3/uL — ABNORMAL HIGH (ref 0.7–3.1)
Lymphs: 32 %
MCH: 30 pg (ref 26.6–33.0)
MCHC: 33 g/dL (ref 31.5–35.7)
MCV: 91 fL (ref 79–97)
Monocytes Absolute: 0.6 10*3/uL (ref 0.1–0.9)
Monocytes: 6 %
Neutrophils Absolute: 6.3 10*3/uL (ref 1.4–7.0)
Neutrophils: 57 %
Platelets: 337 10*3/uL (ref 150–450)
RBC: 3.83 x10E6/uL (ref 3.77–5.28)
RDW: 13 % (ref 12.3–15.4)
WBC: 10.8 10*3/uL (ref 3.4–10.8)

## 2017-12-29 LAB — CMP14 + ANION GAP
ALT: 21 IU/L (ref 0–32)
AST: 17 IU/L (ref 0–40)
Albumin/Globulin Ratio: 1.2 (ref 1.2–2.2)
Albumin: 4.3 g/dL (ref 3.5–5.5)
Alkaline Phosphatase: 58 IU/L (ref 39–117)
Anion Gap: 14 mmol/L (ref 10.0–18.0)
BUN/Creatinine Ratio: 18 (ref 9–23)
BUN: 10 mg/dL (ref 6–24)
Bilirubin Total: 0.2 mg/dL (ref 0.0–1.2)
CO2: 24 mmol/L (ref 20–29)
Calcium: 9.5 mg/dL (ref 8.7–10.2)
Chloride: 100 mmol/L (ref 96–106)
Creatinine, Ser: 0.55 mg/dL — ABNORMAL LOW (ref 0.57–1.00)
GFR calc Af Amer: 125 mL/min/{1.73_m2} (ref >59)
GFR calc non Af Amer: 108 mL/min/{1.73_m2} (ref >59)
Globulin, Total: 3.5 g/dL (ref 1.5–4.5)
Glucose: 88 mg/dL (ref 65–99)
Potassium: 4.2 mmol/L (ref 3.5–5.2)
Sodium: 138 mmol/L (ref 134–144)
Total Protein: 7.8 g/dL (ref 6.0–8.5)

## 2017-12-29 LAB — TSH: TSH: 1.17 u[IU]/mL (ref 0.450–4.500)

## 2017-12-30 ENCOUNTER — Encounter: Payer: Self-pay | Admitting: Internal Medicine

## 2017-12-30 DIAGNOSIS — R634 Abnormal weight loss: Secondary | ICD-10-CM | POA: Insufficient documentation

## 2017-12-30 HISTORY — DX: Abnormal weight loss: R63.4

## 2017-12-30 NOTE — Assessment & Plan Note (Signed)
Health Maintenance screening: TDAP given today.  Up to date on colon cancer screening, mammogram and pap smear.

## 2017-12-30 NOTE — Assessment & Plan Note (Signed)
Essential HTN: Patient BP 120/78 with her prior at 131/77. Denied lightheadedness, weakness, fever, chills, headache or chest pain.  Plan: Continue lisinopril 40mg  daily Continue amlodipine 10mg  daily

## 2017-12-30 NOTE — Assessment & Plan Note (Signed)
Weight Loss: The patient has experienced notable weight loss in the past four years from 190lbs to 137lbs of which ~14lbs was lost in the past year. This weight loss is unintentional but not unexpected as she has decreased her oral intake significantly with her new job. She described eating only snack sized meals for lunch and dinner. She denied nausea, vomiting, acid reflux, early satiety, diarrhea, constipation, abdominal pain, chest pain, cough, hematuria, hematochezia, dark tarry stools, myalgias, or other specific concerns. She stated that she simply eats less than she once did but denied feeling that food was repulsive.   Plan: CXR AP lateral due to smoking history ordered.  CBC, CMP, TSH all unremarkable as below Colonoscopy 2017, cervical 2018, and breast 2018, cancer screening negative We will follow-up on the results above and continue to trend her weight.   CBC    Component Value Date/Time   WBC 10.8 12/28/2017 1614   WBC 11.0 (H) 07/22/2010 1540   RBC 3.83 12/28/2017 1614   RBC 3.90 07/22/2010 1540   HGB 11.5 12/28/2017 1614   HCT 34.8 12/28/2017 1614   PLT 337 12/28/2017 1614   MCV 91 12/28/2017 1614   MCH 30.0 12/28/2017 1614   MCH 29.2 07/22/2010 1540   MCHC 33.0 12/28/2017 1614   MCHC 31.8 07/22/2010 1540   RDW 13.0 12/28/2017 1614   LYMPHSABS 3.4 (H) 12/28/2017 1614   MONOABS 0.6 01/24/2009 0815   EOSABS 0.4 12/28/2017 1614   BASOSABS 0.1 12/28/2017 1614   CMP     Component Value Date/Time   NA 138 12/28/2017 1614   K 4.2 12/28/2017 1614   CL 100 12/28/2017 1614   CO2 24 12/28/2017 1614   GLUCOSE 88 12/28/2017 1614   GLUCOSE 87 06/01/2013 1120   BUN 10 12/28/2017 1614   CREATININE 0.55 (L) 12/28/2017 1614   CREATININE 0.76 06/01/2013 1120   CALCIUM 9.5 12/28/2017 1614   PROT 7.8 12/28/2017 1614   ALBUMIN 4.3 12/28/2017 1614   AST 17 12/28/2017 1614   ALT 21 12/28/2017 1614   ALKPHOS 58 12/28/2017 1614   BILITOT 0.2 12/28/2017 1614   GFRNONAA 108  12/28/2017 1614   GFRNONAA >89 06/01/2013 1120   GFRAA 125 12/28/2017 1614   GFRAA >89 06/01/2013 1120   TSH-1.170

## 2017-12-30 NOTE — Progress Notes (Signed)
Medicine attending: Medical history, presenting problems, physical findings, and medications, reviewed with resident physician Dr Kathi Ludwig on the day of the patient visit and I concur with his evaluation and management plan. No obvious reason for this person's weight loss. Up to date on health maintenance exams. Negative recent abdominal ultrasound. She is a smoker. We will get a routine CXR. Consider CT chest.

## 2017-12-30 NOTE — Assessment & Plan Note (Signed)
Hernia: Surgically repaired. No acute concerns. Healing well.

## 2018-04-04 ENCOUNTER — Other Ambulatory Visit: Payer: Self-pay | Admitting: Internal Medicine

## 2018-04-04 DIAGNOSIS — Z1231 Encounter for screening mammogram for malignant neoplasm of breast: Secondary | ICD-10-CM

## 2018-05-19 ENCOUNTER — Ambulatory Visit
Admission: RE | Admit: 2018-05-19 | Discharge: 2018-05-19 | Disposition: A | Discharge status: Home / Self Care | Payer: BLUE CROSS/BLUE SHIELD | Source: Ambulatory Visit | Attending: Obstetrics and Gynecology | Admitting: Obstetrics and Gynecology

## 2018-05-19 DIAGNOSIS — Z1231 Encounter for screening mammogram for malignant neoplasm of breast: Secondary | ICD-10-CM

## 2018-05-23 ENCOUNTER — Other Ambulatory Visit: Payer: Self-pay | Admitting: Internal Medicine

## 2018-05-23 DIAGNOSIS — I10 Essential (primary) hypertension: Secondary | ICD-10-CM

## 2018-05-23 NOTE — Telephone Encounter (Signed)
Next appt scheduled  06/26/18 with PCP. 

## 2018-05-24 ENCOUNTER — Other Ambulatory Visit: Payer: Self-pay | Admitting: Internal Medicine

## 2018-05-24 NOTE — Telephone Encounter (Signed)
Next appt scheduled  06/26/18 with PCP. 

## 2018-06-26 ENCOUNTER — Ambulatory Visit (INDEPENDENT_AMBULATORY_CARE_PROVIDER_SITE_OTHER): Payer: BLUE CROSS/BLUE SHIELD | Admitting: Internal Medicine

## 2018-06-26 ENCOUNTER — Encounter: Payer: Self-pay | Admitting: Internal Medicine

## 2018-06-26 VITALS — BP 138/86 | HR 82 | Temp 98.6°F | Wt 146.3 lb

## 2018-06-26 DIAGNOSIS — Z23 Encounter for immunization: Secondary | ICD-10-CM | POA: Diagnosis not present

## 2018-06-26 DIAGNOSIS — R634 Abnormal weight loss: Secondary | ICD-10-CM | POA: Diagnosis not present

## 2018-06-26 DIAGNOSIS — I10 Essential (primary) hypertension: Secondary | ICD-10-CM

## 2018-06-26 DIAGNOSIS — Z6823 Body mass index (BMI) 23.0-23.9, adult: Secondary | ICD-10-CM

## 2018-06-26 DIAGNOSIS — Z79899 Other long term (current) drug therapy: Secondary | ICD-10-CM

## 2018-06-26 DIAGNOSIS — F17210 Nicotine dependence, cigarettes, uncomplicated: Secondary | ICD-10-CM | POA: Diagnosis not present

## 2018-06-26 MED ORDER — NICOTINE 7 MG/24HR TD PT24: 7.0000 mg | MEDICATED_PATCH | TRANSDERMAL | 2 refills | Status: AC

## 2018-06-26 MED ORDER — LISINOPRIL 40 MG PO TABS: ORAL_TABLET | ORAL | 3 refills | Status: DC

## 2018-06-26 MED ORDER — PRAVASTATIN SODIUM 40 MG PO TABS: 40.0000 mg | ORAL_TABLET | Freq: Every evening | ORAL | 1 refills | Status: DC

## 2018-06-26 NOTE — Assessment & Plan Note (Signed)
  HTN: Patient endorses adherence to her medication regimen at home. Denied acute concerns. She feels well today. Denied orthostatic symptoms, headache, nausea, chest pain, weakness, or fatigue.   Plan: Continue Amlodipine 10mg  dialy Continue Lisinopril 40mg  daily

## 2018-06-26 NOTE — Assessment & Plan Note (Signed)
Tobacco use disorder: Discussed as patient expressed interest in smoking cessation. She feels that she has more of a habit based stress response desire to smoke as she will at times go tobacco free for almost a day until she becomes stressed. She has cut back to 8-12 per day but attempts to cut back further are complicated by stressful situations.  We will provide the patch today and inquire as to if there are CBT options available to assist her as I feel she has less of a chemical addiction to the nicotine.

## 2018-06-26 NOTE — Assessment & Plan Note (Signed)
Weight loss: Weight loss concern, felt to be lifestyle changes initially. Resolved. This has stabilized.   Plan:  Monitor weight at each visit and continue routine screening.

## 2018-06-26 NOTE — Patient Instructions (Addendum)
FOLLOW-UP INSTRUCTIONS When: 6 months For: routine appointment What to bring: All of your medications   I have not made any changes to your medications today.   Today we discussed your high blood pressure, history of weight loss. Please continue to take your current medications for your blood pressure.   Thank you for your visit to the [name] today. If you have any questions or concerns please call us at [phone].

## 2018-06-26 NOTE — Progress Notes (Signed)
   CC: Routine for HTN, and weight loss  HPI:[name] is a 53 y.o. female who presents for evaluation of Routine for HTN, and weight loss. Please see individual problem based A/P for details.  PHQ-9: Based on the patients    Office Visit from 06/26/2018 in [facility]  PHQ-9 Total Score  0     score we have decided to monitor.  Past Medical History:  Diagnosis Date  . Allergy    seasonal  . Boils 12/15/2010   multiple axilla  . Carpal tunnel syndrome of left wrist   . Chronic headaches 2008    improving  . Constipation   . DDD (degenerative disc disease), cervical   . De Quervain's disease (tenosynovitis)    Right per pt  . Encounter for Papanicolaou smear for cervical cancer screening 12/08/2016  . Epigastric pain 09/21/2017  . Heart murmur    History of Heart murmur as child.  Marland Kitchen Heart palpitations   . Heartburn    Occ  . HLD (hyperlipidemia) 2008  . Hypertension   . Menorrhagia   . OA (osteoarthritis) of neck   . Palpable abdominal aorta 09/21/2017  . Right hip pain   . Right ovarian cyst 11/16/2009   2 small, noted on pelvic US  . Seizures ([place])    history of one time  . Shoulder pain, right 07/2008  . Ventral hernia    Review of Systems:  ROS negative except as per HPI.  Physical Exam: Vitals:   06/26/18 1421  BP: 138/86  Pulse: 82  Temp: 98.6 F (37 C)  TempSrc: Oral  SpO2: 100%  Weight: 146 lb 4.8 oz (66.4 kg)   General: A/O x4 in no acute distress, afebrile, nondiaphoretic Cardio: RRR, no mrg's Pulmonary: CTA bilaterally  MSK: BLE nontender, nonedematous  Assessment & Plan:   See Encounters Tab for problem based charting.  Patient discussed with Dr. Rebeca Alert

## 2018-06-28 ENCOUNTER — Encounter: Payer: BLUE CROSS/BLUE SHIELD | Admitting: Internal Medicine

## 2018-07-10 NOTE — Progress Notes (Signed)
Internal Medicine Clinic Attending  Case discussed with Dr. Harbrecht at the time of the visit.  We reviewed the resident's history and exam and pertinent patient test results.  I agree with the assessment, diagnosis, and plan of care documented in the resident's note.  Alexander Raines, M.D., Ph.D.  

## 2018-12-17 ENCOUNTER — Other Ambulatory Visit: Payer: Self-pay | Admitting: Internal Medicine

## 2019-01-10 ENCOUNTER — Other Ambulatory Visit: Payer: Self-pay

## 2019-01-10 ENCOUNTER — Ambulatory Visit (INDEPENDENT_AMBULATORY_CARE_PROVIDER_SITE_OTHER): Payer: BLUE CROSS/BLUE SHIELD | Admitting: Internal Medicine

## 2019-01-10 ENCOUNTER — Encounter: Payer: Self-pay | Admitting: Internal Medicine

## 2019-01-10 VITALS — BP 121/75 | HR 86 | Temp 99.0°F | Ht 66.0 in | Wt 147.2 lb

## 2019-01-10 DIAGNOSIS — F17210 Nicotine dependence, cigarettes, uncomplicated: Secondary | ICD-10-CM | POA: Diagnosis not present

## 2019-01-10 DIAGNOSIS — Z72820 Sleep deprivation: Secondary | ICD-10-CM | POA: Diagnosis not present

## 2019-01-10 DIAGNOSIS — I10 Essential (primary) hypertension: Secondary | ICD-10-CM | POA: Diagnosis not present

## 2019-01-10 DIAGNOSIS — I498 Other specified cardiac arrhythmias: Secondary | ICD-10-CM | POA: Diagnosis not present

## 2019-01-10 DIAGNOSIS — R634 Abnormal weight loss: Secondary | ICD-10-CM

## 2019-01-10 DIAGNOSIS — Z79899 Other long term (current) drug therapy: Secondary | ICD-10-CM

## 2019-01-10 NOTE — Assessment & Plan Note (Signed)
Stated that she had almost stopped smoking but due to stress and COVID she had started back up to 1/2 ppd.  I offered cessation counseling which was accepted without assistance.

## 2019-01-10 NOTE — Progress Notes (Signed)
   CC: sensation of head fullness  HPI:Ms.Damian SHANDRICKA MONROY is a 53 y.o. female who presents for evaluation of sensation of head fullness, sensation of heart flutter and high blood pressure. Please see individual problem based A/P for details.  Depression, PHQ-9: Based on the patients    Office Visit from 01/10/2019 in [facility]  PHQ-9 Total Score  0     score we have decided to monitor.  Past Medical History:  Diagnosis Date  . Allergy    seasonal  . Boils 12/15/2010   multiple axilla  . Carpal tunnel syndrome of left wrist   . Chronic headaches 2008    improving  . Constipation   . DDD (degenerative disc disease), cervical   . De Quervain's disease (tenosynovitis)    Right per pt  . Encounter for Papanicolaou smear for cervical cancer screening 12/08/2016  . Epigastric pain 09/21/2017  . Heart murmur    History of Heart murmur as child.  Marland Kitchen Heart palpitations   . Heartburn    Occ  . HLD (hyperlipidemia) 2008  . Hypertension   . Menorrhagia   . OA (osteoarthritis) of neck   . Palpable abdominal aorta 09/21/2017  . Right hip pain   . Right ovarian cyst 11/16/2009   2 small, noted on pelvic US  . Seizures ([place])    history of one time  . Shoulder pain, right 07/2008  . Ventral hernia    Review of Systems:  ROS negative except as per HPI.  Physical Exam: Vitals:   01/10/19 1443  BP: 121/75  Pulse: 86  Temp: 99 F (37.2 C)  TempSrc: Oral  SpO2: 100%  Weight: 147 lb 3.2 oz (66.8 kg)  Height: 5\' 6"  (1.676 m)   General: O x4, in no acute distress, afebrile, nondiaphoretic HEENT: PEERL, EMO intact Cardio: RRR, no mrg's  Pulmonary: CTA bilaterally, no wheezing or crackles  Abdomen: Bowel sounds normal, soft, nontender  MSK: BLE nontender, nonedematous Neuro: Alert, normal gait Psych: Appropriate affect, not depressed in appearance, engages well  Assessment & Plan:   See Encounters Tab for problem based charting.  Patient discussed with  Dr. Evette Doffing

## 2019-01-10 NOTE — Assessment & Plan Note (Signed)
  Head fullness: Sensation of head fullness, this started around March of 2020 about the time she started working extra shifts. It is associated with occasionally lightheadedness, worse in evenings after work, works at [employer] as a Clinical research associate. Drinks a half gallon of water a day, eats little at work. Improved with eating and lying down. She stated that she will sleep for prolonged periods if able. Denied visual changes, headache, neck pain, fever, weakness, fatigue, chest pain, urine is dark yellow most days worse on days she drinks regular soda. I feel this is related to her chronic sleep deprivation getting less than 3 hours of sleep almost every other night and increased stress from this.  Plan: Sleep 7 hours at minimum nightly and consistently  Try a small fruit snack on her way home from work Contact us if this does not improve.

## 2019-01-10 NOTE — Assessment & Plan Note (Signed)
  Weight loss: Weight stable now for 2 years. Plan: Continue to monitor routinely

## 2019-01-10 NOTE — Patient Instructions (Addendum)
FOLLOW-UP INSTRUCTIONS When: ~5 months For: Routine visit, or sooner if needed What to bring: All of your medications  I have not made any changes to your medications today.   Today we discussed your sensation of head fullness and heart fluttering. For the fluttering sensation, please be observant for chest pain, lightheadedness, weakness, shortness of breath or any other concerning factor associated with these episodes. For the head fullness sensation, I feel this is related to your lack of sufficient sleep and stress. Please make certain to start getting at least 7 hours of sleep nightly. Also, try eating a piece of fruit before you go home or on your trip if you can safely do so. I will notify you of any abnormal results of any labs from today's evaluation when available to me.   Thank you for your visit to the [facility] today. If you have any questions or concerns please call us at [phone].

## 2019-01-10 NOTE — Assessment & Plan Note (Addendum)
Heart fluttering: Rarely occurs, random, not associated with other symptoms denying dizziness, headache, visual changes, weakness chest pain, dyspnea, nausea, has occurred at least twice, once at home at rest and once while walking at work. No know triggers. This is most likely recurrent PVC's vs GI irritation but there is concern for A-fib. Lauren [name] is not currently experiencing symptoms. I have advised her to return if any new symptoms develop or if this worsens. Lauren [name] has self tapered off of all caffeine drinks.

## 2019-01-10 NOTE — Assessment & Plan Note (Addendum)
  Hypertension: Patient's BP today is 121/75 with a goal of <140/80. The patient endorses adherence to her medication regimen. She denied, chest pain, headache, visual changes, lightheadedness, weakness, dizziness on standing, swelling in the feet or ankles.   Plan: Continue amlodipine 10mg  daily Continue lisinopril 40mg  daily BMP today, prior sCr WNL's at 0.59. Electrolytes WNL's.  Addendum: BMP unremarkable Repeat annually

## 2019-01-11 LAB — BMP8+ANION GAP
Anion Gap: 17 mmol/L (ref 10.0–18.0)
BUN/Creatinine Ratio: 21 (ref 9–23)
BUN: 13 mg/dL (ref 6–24)
CO2: 21 mmol/L (ref 20–29)
Calcium: 9.6 mg/dL (ref 8.7–10.2)
Chloride: 104 mmol/L (ref 96–106)
Creatinine, Ser: 0.63 mg/dL (ref 0.57–1.00)
GFR calc Af Amer: 118 mL/min/{1.73_m2} (ref >59)
GFR calc non Af Amer: 103 mL/min/{1.73_m2} (ref >59)
Glucose: 96 mg/dL (ref 65–99)
Potassium: 4.1 mmol/L (ref 3.5–5.2)
Sodium: 142 mmol/L (ref 134–144)

## 2019-01-11 NOTE — Progress Notes (Signed)
Internal Medicine Clinic Attending  Case discussed with Dr. Harbrecht at the time of the visit.  We reviewed the resident's history and exam and pertinent patient test results.  I agree with the assessment, diagnosis, and plan of care documented in the resident's note.   

## 2019-02-26 ENCOUNTER — Encounter (HOSPITAL_COMMUNITY): Payer: Self-pay | Admitting: Emergency Medicine

## 2019-02-26 ENCOUNTER — Other Ambulatory Visit: Payer: Self-pay

## 2019-02-26 ENCOUNTER — Ambulatory Visit (HOSPITAL_COMMUNITY)
Admission: EM | Admit: 2019-02-26 | Discharge: 2019-02-26 | Disposition: A | Discharge status: Home / Self Care | Payer: BLUE CROSS/BLUE SHIELD | Attending: Family Medicine | Admitting: Family Medicine

## 2019-02-26 DIAGNOSIS — R519 Headache, unspecified: Secondary | ICD-10-CM

## 2019-02-26 DIAGNOSIS — R002 Palpitations: Secondary | ICD-10-CM

## 2019-02-26 DIAGNOSIS — R51 Headache: Secondary | ICD-10-CM | POA: Insufficient documentation

## 2019-02-26 LAB — COMPREHENSIVE METABOLIC PANEL
ALT: 30 U/L (ref 0–44)
AST: 26 U/L (ref 15–41)
Albumin: 4.2 g/dL (ref 3.5–5.0)
Alkaline Phosphatase: 57 U/L (ref 38–126)
Anion gap: 11 (ref 5–15)
BUN: 11 mg/dL (ref 6–20)
CO2: 25 mmol/L (ref 22–32)
Calcium: 9.4 mg/dL (ref 8.9–10.3)
Chloride: 104 mmol/L (ref 98–111)
Creatinine, Ser: 0.61 mg/dL (ref 0.44–1.00)
GFR calc Af Amer: >60 mL/min (ref >60)
GFR calc non Af Amer: >60 mL/min (ref >60)
Glucose, Bld: 105 mg/dL — ABNORMAL HIGH (ref 70–99)
Potassium: 3.9 mmol/L (ref 3.5–5.1)
Sodium: 140 mmol/L (ref 135–145)
Total Bilirubin: 0.5 mg/dL (ref 0.3–1.2)
Total Protein: 7.9 g/dL (ref 6.5–8.1)

## 2019-02-26 LAB — CBC WITH DIFFERENTIAL/PLATELET
Abs Immature Granulocytes: 0.05 10*3/uL (ref 0.00–0.07)
Basophils Absolute: 0.1 10*3/uL (ref 0.0–0.1)
Basophils Relative: 1 %
Eosinophils Absolute: 0.4 10*3/uL (ref 0.0–0.5)
Eosinophils Relative: 2 %
HCT: 38.7 % (ref 36.0–46.0)
Hemoglobin: 13.1 g/dL (ref 12.0–15.0)
Immature Granulocytes: 0 %
Lymphocytes Relative: 20 %
Lymphs Abs: 3.1 10*3/uL (ref 0.7–4.0)
MCH: 31.8 pg (ref 26.0–34.0)
MCHC: 33.9 g/dL (ref 30.0–36.0)
MCV: 93.9 fL (ref 80.0–100.0)
Monocytes Absolute: 0.9 10*3/uL (ref 0.1–1.0)
Monocytes Relative: 6 %
Neutro Abs: 11.3 10*3/uL — ABNORMAL HIGH (ref 1.7–7.7)
Neutrophils Relative %: 71 %
Platelets: 302 10*3/uL (ref 150–400)
RBC: 4.12 MIL/uL (ref 3.87–5.11)
RDW: 13 % (ref 11.5–15.5)
WBC: 15.9 10*3/uL — ABNORMAL HIGH (ref 4.0–10.5)
nRBC: 0 % (ref 0.0–0.2)

## 2019-02-26 LAB — TSH: TSH: 1.155 u[IU]/mL (ref 0.350–4.500)

## 2019-02-26 MED ORDER — KETOROLAC TROMETHAMINE 30 MG/ML IJ SOLN
INTRAMUSCULAR | Status: AC
  Filled 2019-02-26: qty 1

## 2019-02-26 MED ORDER — KETOROLAC TROMETHAMINE 30 MG/ML IJ SOLN
30.0000 mg | Freq: Once | INTRAMUSCULAR | Status: AC
  Administered 2019-02-26: 19:00:00 30 mg via INTRAMUSCULAR

## 2019-02-26 NOTE — Discharge Instructions (Signed)
Your ekg looks well today.  We will check some blood tests to ensure there are no abnormalities which may be contributing to your symptoms.  May take tylenol for head pressure today if needed, don't take any additional ibuprofen or aleve tonight. Please follow up with your primary care provider if your symptoms persist.  Any worsening of symptoms- chest pain , dizziness, increased headache, vision changes, nausea- or otherwise worsening please go to the ER.

## 2019-02-26 NOTE — ED Provider Notes (Signed)
[facility]    CSN: [id number] Arrival date & time: 02/26/19  1737      History   Chief Complaint Chief Complaint  Patient presents with  . Palpitations    HPI Lauren [name] is a 53 y.o. female.   Lauren [name] presents with complaints of sensation of palpitations like her heart is beating fast, as well as head "tightness" and "pressure."  Feels this started approximately 1 hour ago. Was sitting down playing on her phone when it started. No chest pain . Sometimes when she feels the palpitations she feels shortness of breath . Denies current shortness of breath. She feels mild palpitations currently. No leg pain or swelling. She has been eating and drinking today. Doesn't drink caffeine. She smokes approximately 1/2 pack of cigarettes a day. Denies any increase stressors. No illegal drug use. Walking/ activity seems to worsen her symptoms. No vision change. States she has had something similar in the past but feels it wasn't as severe. History of headaches but states they feel different. Hasn't taken any medications for her symptoms. History  Of boils, allergies, headaches, constipation, ddd, heart palpitations, heartburn, htn.     ROS per HPI, negative if not otherwise mentioned.      Past Medical History:  Diagnosis Date  . Allergy    seasonal  . Boils 12/15/2010   multiple axilla  . Carpal tunnel syndrome of left wrist   . Chronic headaches 2008    improving  . Constipation   . Constipation 03/19/2015  . DDD (degenerative disc disease), cervical   . De Quervain's disease (tenosynovitis)    Right per pt  . Encounter for Papanicolaou smear for cervical cancer screening 12/08/2016  . Epigastric pain 09/21/2017  . Heart murmur    History of Heart murmur as child.  Marland Kitchen Heart palpitations   . Heartburn    Occ  . HLD (hyperlipidemia) 2008  . Hypertension   . Menorrhagia   . OA (osteoarthritis) of neck   . Palpable abdominal aorta 09/21/2017  . Right hip  pain   . Right ovarian cyst 11/16/2009   2 small, noted on pelvic US  . Seizures ([place])    history of one time  . Shoulder pain, right 07/2008  . Ventral hernia   . Weight loss 12/30/2017  . Weight loss 12/30/2017    Patient Active Problem List   Diagnosis Date Noted  . Sleep deprivation 01/10/2019  . Recurrent abdominal hernia without obstruction or gangrene 09/21/2017  . De Quervain's tenosynovitis, left 08/10/2017  . Encounter for Papanicolaou smear for cervical cancer screening 12/08/2016  . Seasonal allergies 12/08/2016  . Carpal tunnel syndrome 12/08/2016  . Cervical radiculopathy 01/19/2016  . Fluttering heart 09/10/2015  . Colon cancer screening 03/19/2015  . Smoking 1/2 pack a day or less 03/14/2014  . Dyslipidemia 04/07/2007  . Essential hypertension 05/20/2006    Past Surgical History:  Procedure Laterality Date  . COLONOSCOPY W/ POLYPECTOMY  12/24/2015  . INSERTION OF MESH N/A 10/25/2017   Procedure: INSERTION OF MESH;  Surgeon: Kinsinger, Arta Bruce, MD;  Location: [facility];  Service: General;  Laterality: N/A;  . VENTRAL HERNIA REPAIR N/A 10/25/2017   Procedure: LAPAROSCOPIC VENTRAL HERNIA;  Surgeon: Kieth Brightly Arta Bruce, MD;  Location: [facility];  Service: General;  Laterality: N/A;    OB History   No obstetric history on file.      Home Medications    Prior to Admission medications  Medication Sig Start Date End Date Taking? Authorizing Provider  amLODipine (NORVASC) 10 MG tablet Take 1 tablet (10 mg total) by mouth daily. 12/28/17   Kathi Ludwig, MD  ibuprofen (ADVIL,MOTRIN) 800 MG tablet Take 1 tablet (800 mg total) by mouth every 8 (eight) hours as needed. 10/25/17   Kinsinger, Arta Bruce, MD  lisinopril (PRINIVIL,ZESTRIL) 40 MG tablet TAKE 1 TABLET BY MOUTH EVERY DAY 06/26/18   Kathi Ludwig, MD  Multiple Vitamins-Minerals (CENTRUM SILVER 50+WOMEN PO) Take by mouth.    [provider]  nicotine  (NICODERM CQ - DOSED IN MG/24 HR) 7 mg/24hr patch Place 1 patch (7 mg total) onto the skin daily. 06/26/18 06/26/19  Kathi Ludwig, MD  polyethylene glycol powder (GLYCOLAX/MIRALAX) powder Take 17 g by mouth once. Patient taking differently: Take 17 g by mouth as needed.  03/19/15   Burns, Arloa Koh, MD  pravastatin (PRAVACHOL) 40 MG tablet TAKE 1 TABLET BY MOUTH EVERY EVENING 12/18/18   Kathi Ludwig, MD    Family History Family History  Problem Relation Age of Onset  . Heart disease Mother   . Breast cancer Neg Hx     Social History Social History   Tobacco Use  . Smoking status: Current Every Day Smoker    Packs/day: 0.50    Years: 36.00    Pack years: 18.00    Types: Cigarettes  . Smokeless tobacco: Never Used  . Tobacco comment: Cutting back - less than 1/2 pack.  Substance Use Topics  . Alcohol use: Yes    Alcohol/week: 0.0 standard drinks    Comment: seldom  . Drug use: Yes    Types: Marijuana    Comment: 2-3 times per week for pain     Allergies   Other and Penicillins   Review of Systems Review of Systems   Physical Exam Triage Vital Signs ED Triage Vitals [02/26/19 1754]  Enc Vitals Group     BP 138/87     Pulse Rate 88     Resp 18     Temp 98.4 F (36.9 C)     Temp Source Oral     SpO2 100 %     Weight      Height      Head Circumference      Peak Flow      Pain Score 3     Pain Loc      Pain Edu?      Excl. in [place]?    No data found.  Updated Vital Signs BP 138/87 (BP Location: Right Arm)   Pulse 88   Temp 98.4 F (36.9 C) (Oral)   Resp 18   LMP 08/09/2012   SpO2 100%    Physical Exam Constitutional:      General: She is not in acute distress.    Appearance: She is well-developed.  Cardiovascular:     Rate and Rhythm: Normal rate and regular rhythm.     Heart sounds: Normal heart sounds.  Pulmonary:     Effort: Pulmonary effort is normal.     Breath sounds: Normal breath sounds.  Skin:    General: Skin is warm and  dry.  Neurological:     General: No focal deficit present.     Mental Status: She is alert and oriented to person, place, and time.     Cranial Nerves: No cranial nerve deficit.     Sensory: No sensory deficit.     Motor: No weakness.  Gait: Gait normal.     EKG:  NSR rate 89 . Previous EKG was available for review. No stwave changes as interpreted by me.   UC Treatments / Results  Labs (all labs ordered are listed, but only abnormal results are displayed) Labs Reviewed  CBC WITH DIFFERENTIAL/PLATELET  COMPREHENSIVE METABOLIC PANEL  TSH    EKG   Radiology No results found.  Procedures Procedures (including critical care time)  Medications Ordered in UC Medications  ketorolac (TORADOL) 30 MG/ML injection 30 mg (30 mg Intramuscular Given 02/26/19 1917)  ketorolac (TORADOL) 30 MG/ML injection (has no administration in time range)    Initial Impression / Assessment and Plan / UC Course  I have reviewed the triage vital signs and the nursing notes.  Pertinent labs & imaging results that were available during my care of the patient were reviewed by me and considered in my medical decision making (see chart for details).     ekg reassuring. bp stable. Basic labs obtained and pending. toradol provided for head pressure. Strict er precautions discussed. Encouraged follow up with PCP. Patient verbalized understanding and agreeable to plan.  Ambulatory out of clinic without difficulty.    Final Clinical Impressions(s) / UC Diagnoses   Final diagnoses:  Palpitations  Acute nonintractable headache, unspecified headache type     Discharge Instructions     Your ekg looks well today.  We will check some blood tests to ensure there are no abnormalities which may be contributing to your symptoms.  May take tylenol for head pressure today if needed, don't take any additional ibuprofen or aleve tonight. Please follow up with your primary care provider if your symptoms persist.   Any worsening of symptoms- chest pain , dizziness, increased headache, vision changes, nausea- or otherwise worsening please go to the ER.    ED Prescriptions    None     Controlled Substance Prescriptions [place] Controlled Substance Registry consulted? Not Applicable   Zigmund Gottron, NP 02/26/19 1946

## 2019-02-26 NOTE — ED Triage Notes (Signed)
Pt sts palpitations x 1 hour

## 2019-02-28 ENCOUNTER — Telehealth (HOSPITAL_COMMUNITY): Payer: Self-pay | Admitting: Emergency Medicine

## 2019-02-28 NOTE — Telephone Encounter (Signed)
Reivewed labs with Lanelle Bal, called patient to see how she was feeling, pt states she feels better. Will follow up with PCP for recheck. All questions answered.

## 2019-03-07 ENCOUNTER — Ambulatory Visit (INDEPENDENT_AMBULATORY_CARE_PROVIDER_SITE_OTHER): Payer: BLUE CROSS/BLUE SHIELD | Admitting: Internal Medicine

## 2019-03-07 ENCOUNTER — Other Ambulatory Visit: Payer: Self-pay

## 2019-03-07 ENCOUNTER — Encounter: Payer: Self-pay | Admitting: Internal Medicine

## 2019-03-07 VITALS — BP 128/77 | HR 88 | Temp 99.3°F | Ht 66.0 in | Wt 149.8 lb

## 2019-03-07 DIAGNOSIS — R002 Palpitations: Secondary | ICD-10-CM | POA: Diagnosis not present

## 2019-03-07 DIAGNOSIS — Z720 Tobacco use: Secondary | ICD-10-CM | POA: Diagnosis not present

## 2019-03-07 DIAGNOSIS — I498 Other specified cardiac arrhythmias: Secondary | ICD-10-CM

## 2019-03-07 DIAGNOSIS — F17210 Nicotine dependence, cigarettes, uncomplicated: Secondary | ICD-10-CM

## 2019-03-07 NOTE — Progress Notes (Signed)
   CC: palpitations   HPI:Ms.Para KAMELA STONES is a 53 y.o. female who presents for evaluation of Palpitations. Please see individual problem based A/P for details.  Influenza A: Received previously at her job.  Past Medical History:  Diagnosis Date  . Allergy    seasonal  . Boils 12/15/2010   multiple axilla  . Carpal tunnel syndrome of left wrist   . Chronic headaches 2008    improving  . Constipation   . Constipation 03/19/2015  . DDD (degenerative disc disease), cervical   . De Quervain's disease (tenosynovitis)    Right per pt  . Encounter for Papanicolaou smear for cervical cancer screening 12/08/2016  . Epigastric pain 09/21/2017  . Heart murmur    History of Heart murmur as child.  Marland Kitchen Heart palpitations   . Heartburn    Occ  . HLD (hyperlipidemia) 2008  . Hypertension   . Menorrhagia   . OA (osteoarthritis) of neck   . Palpable abdominal aorta 09/21/2017  . Right hip pain   . Right ovarian cyst 11/16/2009   2 small, noted on pelvic US  . Seizures ([place])    history of one time  . Shoulder pain, right 07/2008  . Ventral hernia   . Weight loss 12/30/2017  . Weight loss 12/30/2017   Review of Systems:  ROS negative except as per HPI.  Physical Exam: Vitals:   03/07/19 1412  BP: 128/77  Pulse: 88  Temp: 99.3 F (37.4 C)  TempSrc: Oral  SpO2: 100%  Weight: 149 lb 12.8 oz (67.9 kg)  Height: 5\' 6"  (1.676 m)   General: A/O x4, in no acute distress, afebrile, nondiaphoretic HEENT: PEERL, EMO intact Cardio: RRR, no mrg's  Pulmonary: CTA bilaterally, no wheezing or crackles  MSK: BLE nontender, nonedematous Psych: Appropriate affect, not depressed in appearance, engages well  Assessment & Plan:   See Encounters Tab for problem based charting.  Patient discussed with Dr. Evette Doffing

## 2019-03-07 NOTE — Assessment & Plan Note (Signed)
Patient decreased to 4 cigarettes/day continues to work on this.  This however does increase her risk for CAD

## 2019-03-07 NOTE — Assessment & Plan Note (Addendum)
Palpitations: Head fullness, palpitations. Patient stated that she sitting in her chair at home after she had finished cooking dinner as she did not feel hungry. She felt that there was head fullness and palpitations while she was cooking but felt worse while sitting. She began to sweat, felt as if her head was full and uneasy. She attempted to sit and hope this would improve but it did not. Within 55min she opted to visit urgent care. She was still symptomatic during the EKG at urgent care. She noted mild dyspnea with the EKG.  She denied chest pain.  I am concerned for ischemia versus arrhythmia.  Plan: Ambulatory cardiac monitoring 2 weeks ordered to investigate for atrial fibrillation. Echocardiogram complete ordered investigate for all normalities dilation can induce an arrhythmia. - Will need ischemic evaluation if echo and cardiac monitoring are unremarkable

## 2019-03-07 NOTE — Patient Instructions (Signed)
FOLLOW-UP INSTRUCTIONS When: Third week of November  For: follow-up of your palpitations What to bring: All of your medications  I have not made any changes to your medications today.   Today we discussed palpitations and head fullness. I have ordered an echocardiogram and long term 2 week monitoring of your irregular heart rhythm.   Thank you for your visit to the [facility] today. If you have any questions or concerns please call us at [phone].

## 2019-03-08 NOTE — Progress Notes (Signed)
Internal Medicine Clinic Attending  Case discussed with Dr. Harbrecht at the time of the visit.  We reviewed the resident's history and exam and pertinent patient test results.  I agree with the assessment, diagnosis, and plan of care documented in the resident's note.   

## 2019-03-12 ENCOUNTER — Ambulatory Visit (HOSPITAL_COMMUNITY): Payer: BLUE CROSS/BLUE SHIELD | Attending: Internal Medicine

## 2019-03-12 ENCOUNTER — Other Ambulatory Visit: Payer: Self-pay

## 2019-03-12 DIAGNOSIS — I498 Other specified cardiac arrhythmias: Secondary | ICD-10-CM | POA: Diagnosis present

## 2019-03-12 DIAGNOSIS — F17210 Nicotine dependence, cigarettes, uncomplicated: Secondary | ICD-10-CM | POA: Diagnosis present

## 2019-03-12 NOTE — Progress Notes (Signed)
2D Echocardiogram has been performed. [facility] Axton Cihlar RDCS 03/12/2019 3:34pm

## 2019-03-14 ENCOUNTER — Other Ambulatory Visit: Payer: Self-pay | Admitting: Internal Medicine

## 2019-03-14 DIAGNOSIS — I10 Essential (primary) hypertension: Secondary | ICD-10-CM

## 2019-03-15 ENCOUNTER — Telehealth: Payer: Self-pay | Admitting: *Deleted

## 2019-03-15 NOTE — Telephone Encounter (Signed)
Attempted to contact patient to set up 14 day ZIO XT for palpitations/ fluttering per Dr. Kathi Ludwig.  LMVM to call [facility] at [phone] to set up monitor.

## 2019-03-16 NOTE — Telephone Encounter (Signed)
Patient returned call, stated to please call back after 1pm.

## 2019-03-19 ENCOUNTER — Telehealth: Payer: Self-pay | Admitting: *Deleted

## 2019-03-19 NOTE — Telephone Encounter (Signed)
14 day ZIO XT long term holter monitor to be mailed to the patients home.  Instructions reviewed briefly as they are included in the monitor kit. 

## 2019-03-24 ENCOUNTER — Ambulatory Visit (INDEPENDENT_AMBULATORY_CARE_PROVIDER_SITE_OTHER): Payer: BLUE CROSS/BLUE SHIELD

## 2019-03-24 DIAGNOSIS — R002 Palpitations: Secondary | ICD-10-CM

## 2019-03-24 DIAGNOSIS — I498 Other specified cardiac arrhythmias: Secondary | ICD-10-CM

## 2019-03-24 DIAGNOSIS — F17210 Nicotine dependence, cigarettes, uncomplicated: Secondary | ICD-10-CM

## 2019-04-05 ENCOUNTER — Other Ambulatory Visit: Payer: Self-pay | Admitting: Obstetrics and Gynecology

## 2019-04-05 DIAGNOSIS — Z1231 Encounter for screening mammogram for malignant neoplasm of breast: Secondary | ICD-10-CM

## 2019-04-30 ENCOUNTER — Encounter: Payer: Self-pay | Admitting: Internal Medicine

## 2019-04-30 ENCOUNTER — Other Ambulatory Visit: Payer: Self-pay | Admitting: Internal Medicine

## 2019-04-30 DIAGNOSIS — R002 Palpitations: Secondary | ICD-10-CM

## 2019-04-30 DIAGNOSIS — I498 Other specified cardiac arrhythmias: Secondary | ICD-10-CM

## 2019-04-30 DIAGNOSIS — F17210 Nicotine dependence, cigarettes, uncomplicated: Secondary | ICD-10-CM

## 2019-05-02 ENCOUNTER — Telehealth: Payer: Self-pay | Admitting: Internal Medicine

## 2019-05-02 DIAGNOSIS — I471 Supraventricular tachycardia: Secondary | ICD-10-CM

## 2019-05-02 NOTE — Telephone Encounter (Signed)
Notified patient of the results of her long term cardiac monitoring with the predominant rhythm being SVT. I advised her that given her symptoms which only partially correspond with the arrhythmia I suggest she see cardiology. She is agreeable to this. Referral placed.

## 2019-05-04 ENCOUNTER — Telehealth: Payer: Self-pay | Admitting: *Deleted

## 2019-05-04 NOTE — Telephone Encounter (Signed)
Patient called in stating cardiology office contacted her requesting results of heart monitor prior to her appt with them. Heart monitor was mailed to her by [facility]. Gave her name of person who set this up Markus Daft) and asked patient to contact her for results at [phone]. Hubbard Hartshorn, BSN, RN-BC

## 2019-05-25 ENCOUNTER — Ambulatory Visit
Admission: RE | Admit: 2019-05-25 | Discharge: 2019-05-25 | Disposition: A | Discharge status: Home / Self Care | Payer: BLUE CROSS/BLUE SHIELD | Source: Ambulatory Visit | Attending: Obstetrics and Gynecology | Admitting: Obstetrics and Gynecology

## 2019-05-25 ENCOUNTER — Other Ambulatory Visit: Payer: Self-pay

## 2019-05-25 DIAGNOSIS — Z1231 Encounter for screening mammogram for malignant neoplasm of breast: Secondary | ICD-10-CM

## 2019-05-30 ENCOUNTER — Encounter: Payer: BLUE CROSS/BLUE SHIELD | Admitting: Internal Medicine

## 2019-06-12 ENCOUNTER — Other Ambulatory Visit: Payer: Self-pay | Admitting: Internal Medicine

## 2019-06-12 DIAGNOSIS — I10 Essential (primary) hypertension: Secondary | ICD-10-CM

## 2019-06-25 ENCOUNTER — Encounter: Payer: Self-pay | Admitting: *Deleted

## 2019-06-28 ENCOUNTER — Ambulatory Visit: Payer: PRIVATE HEALTH INSURANCE | Attending: Internal Medicine

## 2019-06-28 ENCOUNTER — Telehealth: Payer: Self-pay | Admitting: Internal Medicine

## 2019-06-28 DIAGNOSIS — Z20822 Contact with and (suspected) exposure to covid-19: Secondary | ICD-10-CM

## 2019-06-28 NOTE — Telephone Encounter (Signed)
Called pt - stated her heart doctor changed her blood pressure pill last month and now her BP is high. Current med list shows pt is on Lisinopril and Amlodipine. Stated she's not at home and unsure what the name of the new medication. Informed pt to call her cardiologist - stated she will.

## 2019-06-28 NOTE — Telephone Encounter (Signed)
Pt calling to report her BP is 144/84 and she has been taking a new BP medicine per her heart doctor and is has some concerns.  Pt requesting a call back.

## 2019-06-29 LAB — NOVEL CORONAVIRUS, NAA: SARS-CoV-2, NAA: NOT DETECTED

## 2019-07-24 NOTE — Progress Notes (Signed)
   CC: "Fluttering heart beat"  HPI:Lauren [name] is a 54 y.o. female who presents for evaluation of SVT and tobacco use disorder. Please see individual problem based A/P for details.  Depression, PHQ-9: Based on the patients    Office Visit from 07/25/2019 in [facility]  PHQ-9 Total Score  0     score we have decided to monitor.  Past Medical History:  Diagnosis Date  . Allergy    seasonal  . Boils 12/15/2010   multiple axilla  . Carpal tunnel syndrome of left wrist   . Chronic headaches 2008    improving  . Constipation   . Constipation 03/19/2015  . DDD (degenerative disc disease), cervical   . De Quervain's disease (tenosynovitis)    Right per pt  . Encounter for Papanicolaou smear for cervical cancer screening 12/08/2016  . Epigastric pain 09/21/2017  . Heart murmur    History of Heart murmur as child.  Marland Kitchen Heart palpitations   . Heartburn    Occ  . HLD (hyperlipidemia) 2008  . Hypertension   . Menorrhagia   . OA (osteoarthritis) of neck   . Palpable abdominal aorta 09/21/2017  . Right hip pain   . Right ovarian cyst 11/16/2009   2 small, noted on pelvic US  . Seizures ([place])    history of one time  . Shoulder pain, right 07/2008  . Ventral hernia   . Weight loss 12/30/2017  . Weight loss 12/30/2017   Review of Systems:  ROS negative except as per HPI.  Physical Exam: Vitals:   07/25/19 1419  BP: 123/74  Pulse: 96  Temp: 98.1 F (36.7 C)  TempSrc: Oral  SpO2: 100%  Weight: 146 lb 9.6 oz (66.5 kg)  Height: 5\' 6"  (1.676 m)   Filed Weights   07/25/19 1419  Weight: 146 lb 9.6 oz (66.5 kg)   General: A/O x4, in no acute distress, afebrile, nondiaphoretic HEENT: PEERL, EMO intact Cardio: RRR, no mrg's  Pulmonary: CTA bilaterally, no wheezing or crackles  Abdomen: Bowel sounds normal, soft, nontender  MSK: BLE nontender, nonedematous Neuro: Alert, CNII-XII grossly intact, conversational, normal gait Psych: Appropriate affect,  not depressed in appearance, engages well  Assessment & Plan:   See Encounters Tab for problem based charting.  Patient discussed with Dr. Angelia Mould

## 2019-07-25 ENCOUNTER — Other Ambulatory Visit: Payer: Self-pay

## 2019-07-25 ENCOUNTER — Encounter: Payer: Self-pay | Admitting: Internal Medicine

## 2019-07-25 ENCOUNTER — Ambulatory Visit (INDEPENDENT_AMBULATORY_CARE_PROVIDER_SITE_OTHER): Payer: PRIVATE HEALTH INSURANCE | Admitting: Internal Medicine

## 2019-07-25 VITALS — BP 123/74 | HR 96 | Temp 98.1°F | Ht 66.0 in | Wt 146.6 lb

## 2019-07-25 DIAGNOSIS — Z79899 Other long term (current) drug therapy: Secondary | ICD-10-CM

## 2019-07-25 DIAGNOSIS — I10 Essential (primary) hypertension: Secondary | ICD-10-CM | POA: Diagnosis not present

## 2019-07-25 DIAGNOSIS — I471 Supraventricular tachycardia: Secondary | ICD-10-CM | POA: Diagnosis not present

## 2019-07-25 DIAGNOSIS — Z720 Tobacco use: Secondary | ICD-10-CM | POA: Diagnosis not present

## 2019-07-25 DIAGNOSIS — F17210 Nicotine dependence, cigarettes, uncomplicated: Secondary | ICD-10-CM

## 2019-07-25 DIAGNOSIS — E785 Hyperlipidemia, unspecified: Secondary | ICD-10-CM | POA: Diagnosis not present

## 2019-07-25 MED ORDER — PRAVASTATIN SODIUM 40 MG PO TABS: 40.0000 mg | ORAL_TABLET | Freq: Every evening | ORAL | 3 refills | Status: DC

## 2019-07-25 NOTE — Assessment & Plan Note (Signed)
Hypertension: Patient's BP today is 123/74 with a goal of <140/80. The patient endorses adherence to her medication regimen. She denied, chest pain, headache, visual changes, lightheadedness, weakness, dizziness on standing, swelling in the feet or ankles.  CMP in September 2020 with stable creatinine and potassium.  Plan: Continue loaded pain 10 mg daily Continue lisinopril 40 mg daily BMP at next visit

## 2019-07-25 NOTE — Assessment & Plan Note (Signed)
SVT: Has seen Cardiology at [facility]. They recommended no further workup but advised her to start Diltiazem and to stop Amlodipine. She did not like the way this medication made her feel with the fact that increase her blood pressure.  I feel it is most likely that stopping the amlodipine caused the increase in blood pressure and will defer alternative therapy to her cardiologist.  For now she wishes to continue amlodipine and has stopped the diltiazem.  It appears that she has a follow-up appointment in April with her cardiologist.  Plan:  Monitor symptoms Follow with cardiology

## 2019-07-25 NOTE — Assessment & Plan Note (Signed)
Tobacco use disorder: Discussed tobacco use disorder and cessation.  Discussed the plan smoking 1 last cigarette every day for a month and she currently smokes.  Each month she should attempt to decrease the number of cigarettes by 1.  She has agreed to attempt this.

## 2019-07-25 NOTE — Patient Instructions (Signed)
FOLLOW-UP INSTRUCTIONS When: 6 months For: Routine What to bring: All of your medications  I have not made any changes to your medications today.   Today we discussed your high blood pressure and smoking history.   As we discussed, please decrease the number of cigarettes you smoke daily by one per month. Do this each month for the next 6-12 months.   Thank you for your visit to the [facility] today. If you have any questions or concerns please call us at [phone].

## 2019-07-25 NOTE — Assessment & Plan Note (Signed)
Continue pravastatin.  Refill supplied.

## 2019-07-26 NOTE — Progress Notes (Signed)
Internal Medicine Clinic Attending  Case discussed with Dr. Harbrecht at the time of the visit.  We reviewed the resident's history and exam and pertinent patient test results.  I agree with the assessment, diagnosis, and plan of care documented in the resident's note.   

## 2019-09-09 ENCOUNTER — Other Ambulatory Visit: Payer: Self-pay | Admitting: Internal Medicine

## 2019-09-09 DIAGNOSIS — I10 Essential (primary) hypertension: Secondary | ICD-10-CM

## 2019-09-28 ENCOUNTER — Other Ambulatory Visit: Payer: Self-pay

## 2019-09-28 ENCOUNTER — Ambulatory Visit (HOSPITAL_COMMUNITY)
Admission: EM | Admit: 2019-09-28 | Discharge: 2019-09-28 | Disposition: A | Discharge status: Home / Self Care | Payer: BC Managed Care – PPO | Attending: Internal Medicine | Admitting: Internal Medicine

## 2019-09-28 ENCOUNTER — Encounter (HOSPITAL_COMMUNITY): Payer: Self-pay

## 2019-09-28 DIAGNOSIS — Z79899 Other long term (current) drug therapy: Secondary | ICD-10-CM | POA: Diagnosis not present

## 2019-09-28 DIAGNOSIS — I10 Essential (primary) hypertension: Secondary | ICD-10-CM | POA: Insufficient documentation

## 2019-09-28 DIAGNOSIS — G8929 Other chronic pain: Secondary | ICD-10-CM | POA: Insufficient documentation

## 2019-09-28 DIAGNOSIS — F17210 Nicotine dependence, cigarettes, uncomplicated: Secondary | ICD-10-CM | POA: Diagnosis not present

## 2019-09-28 DIAGNOSIS — R200 Anesthesia of skin: Secondary | ICD-10-CM

## 2019-09-28 DIAGNOSIS — E785 Hyperlipidemia, unspecified: Secondary | ICD-10-CM | POA: Diagnosis not present

## 2019-09-28 DIAGNOSIS — M25552 Pain in left hip: Secondary | ICD-10-CM | POA: Insufficient documentation

## 2019-09-28 LAB — COMPREHENSIVE METABOLIC PANEL
ALT: 23 U/L (ref 0–44)
AST: 24 U/L (ref 15–41)
Albumin: 4.1 g/dL (ref 3.5–5.0)
Alkaline Phosphatase: 58 U/L (ref 38–126)
Anion gap: 9 (ref 5–15)
BUN: 10 mg/dL (ref 6–20)
CO2: 26 mmol/L (ref 22–32)
Calcium: 9.6 mg/dL (ref 8.9–10.3)
Chloride: 105 mmol/L (ref 98–111)
Creatinine, Ser: 0.61 mg/dL (ref 0.44–1.00)
GFR calc Af Amer: >60 mL/min (ref >60)
GFR calc non Af Amer: >60 mL/min (ref >60)
Glucose, Bld: 121 mg/dL — ABNORMAL HIGH (ref 70–99)
Potassium: 3.8 mmol/L (ref 3.5–5.1)
Sodium: 140 mmol/L (ref 135–145)
Total Bilirubin: 0.8 mg/dL (ref 0.3–1.2)
Total Protein: 7.8 g/dL (ref 6.5–8.1)

## 2019-09-28 LAB — CBC WITH DIFFERENTIAL/PLATELET
Abs Immature Granulocytes: 0.04 10*3/uL (ref 0.00–0.07)
Basophils Absolute: 0.1 10*3/uL (ref 0.0–0.1)
Basophils Relative: 1 %
Eosinophils Absolute: 0.3 10*3/uL (ref 0.0–0.5)
Eosinophils Relative: 3 %
HCT: 39.1 % (ref 36.0–46.0)
Hemoglobin: 12.7 g/dL (ref 12.0–15.0)
Immature Granulocytes: 0 %
Lymphocytes Relative: 25 %
Lymphs Abs: 2.7 10*3/uL (ref 0.7–4.0)
MCH: 30.3 pg (ref 26.0–34.0)
MCHC: 32.5 g/dL (ref 30.0–36.0)
MCV: 93.3 fL (ref 80.0–100.0)
Monocytes Absolute: 0.7 10*3/uL (ref 0.1–1.0)
Monocytes Relative: 7 %
Neutro Abs: 7.1 10*3/uL (ref 1.7–7.7)
Neutrophils Relative %: 64 %
Platelets: 338 10*3/uL (ref 150–400)
RBC: 4.19 MIL/uL (ref 3.87–5.11)
RDW: 13.4 % (ref 11.5–15.5)
WBC: 10.9 10*3/uL — ABNORMAL HIGH (ref 4.0–10.5)
nRBC: 0 % (ref 0.0–0.2)

## 2019-09-28 LAB — VITAMIN D 25 HYDROXY (VIT D DEFICIENCY, FRACTURES): Vit D, 25-Hydroxy: 23.67 ng/mL — ABNORMAL LOW (ref 30–100)

## 2019-09-28 LAB — TSH: TSH: 0.763 u[IU]/mL (ref 0.350–4.500)

## 2019-09-28 MED ORDER — PREDNISONE 20 MG PO TABS: 20.0000 mg | ORAL_TABLET | Freq: Every day | ORAL | 0 refills | Status: AC

## 2019-09-28 NOTE — ED Provider Notes (Signed)
[name]    CSN: [id number] Arrival date & time: 09/28/19  [id number]      History   Chief Complaint Chief Complaint  Patient presents with  . Numbness    HPI Lauren [name] is a 54 y.o. female with no past medical history comes to urgent care with complaints of acute on chronic left hip pain and bilateral left lower extremity numbness of a couple of weeks duration.  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  She denies any back pain.  No weakness in the lower extremities.  She denies any fall.  No change in bladder/bowel habits.  She denies rash.  No dizziness, near syncope or syncopal episodes.  No eye symptoms.  Patient denies any back pain.Marland Kitchen   HPI  Past Medical History:  Diagnosis Date  . Allergy    seasonal  . Boils 12/15/2010   multiple axilla  . Carpal tunnel syndrome of left wrist   . Chronic headaches 2008    improving  . Constipation   . Constipation 03/19/2015  . DDD (degenerative disc disease), cervical   . De Quervain's disease (tenosynovitis)    Right per pt  . Encounter for Papanicolaou smear for cervical cancer screening 12/08/2016  . Epigastric pain 09/21/2017  . Heart murmur    History of Heart murmur as child.  Marland Kitchen Heart palpitations   . Heartburn    Occ  . HLD (hyperlipidemia) 2008  . Hypertension   . Menorrhagia   . OA (osteoarthritis) of neck   . Palpable abdominal aorta 09/21/2017  . Right hip pain   . Right ovarian cyst 11/16/2009   2 small, noted on pelvic US  . Seizures ([place])    history of one time  . Shoulder pain, right 07/2008  . Ventral hernia   . Weight loss 12/30/2017  . Weight loss 12/30/2017    Patient Active Problem List   Diagnosis Date Noted  . Sleep deprivation 01/10/2019  . Recurrent abdominal hernia without obstruction or gangrene 09/21/2017  . De Quervain's tenosynovitis, left 08/10/2017  . Encounter for Papanicolaou smear for cervical cancer screening 12/08/2016  . Seasonal allergies 12/08/2016  . Carpal tunnel  syndrome 12/08/2016  . Cervical radiculopathy 01/19/2016  . SVT (supraventricular tachycardia) ([place]) 09/10/2015  . Colon cancer screening 03/19/2015  . Smoking 1/2 pack a day or less 03/14/2014  . Dyslipidemia 04/07/2007  . Essential hypertension 05/20/2006    Past Surgical History:  Procedure Laterality Date  . COLONOSCOPY W/ POLYPECTOMY  12/24/2015  . INSERTION OF MESH N/A 10/25/2017   Procedure: INSERTION OF MESH;  Surgeon: Kinsinger, Arta Bruce, MD;  Location: [facility];  Service: General;  Laterality: N/A;  . VENTRAL HERNIA REPAIR N/A 10/25/2017   Procedure: LAPAROSCOPIC VENTRAL HERNIA;  Surgeon: Kieth Brightly Arta Bruce, MD;  Location: [facility];  Service: General;  Laterality: N/A;    OB History   No obstetric history on file.      Home Medications    Prior to Admission medications   Medication Sig Start Date End Date Taking? Authorizing Provider  diltiazem (TIAZAC) 240 MG 24 hr capsule TAKE 1 CAPSULE(240 MG) BY MOUTH DAILY 06/05/19  Yes [provider]  lisinopril (ZESTRIL) 40 MG tablet Take by mouth. 03/14/19  Yes [provider]  pravastatin (PRAVACHOL) 40 MG tablet Take by mouth. 03/14/19  Yes [provider]  amLODipine (NORVASC) 10 MG tablet TAKE 1 TABLET(10 MG) BY MOUTH DAILY 03/14/19   Kathi Ludwig, MD  diltiazem (CARDIZEM CD) 240 MG 24 hr capsule Take 240 mg by mouth daily. 06/05/19   [provider]  ibuprofen (ADVIL,MOTRIN) 800 MG tablet Take 1 tablet (800 mg total) by mouth every 8 (eight) hours as needed. 10/25/17   Kinsinger, Arta Bruce, MD  lisinopril (ZESTRIL) 40 MG tablet TAKE 1 TABLET BY MOUTH EVERY DAY 06/12/19   Kathi Ludwig, MD  Multiple Vitamin (MULTIVITAMIN ADULT PO) Take by mouth.    [provider]  Multiple Vitamins-Minerals (CENTRUM SILVER 50+WOMEN PO) Take by mouth.    [provider]  polyethylene glycol powder (GLYCOLAX/MIRALAX) powder Take 17 g by  mouth once. Patient taking differently: Take 17 g by mouth as needed.  03/19/15   Burns, Arloa Koh, MD  pravastatin (PRAVACHOL) 40 MG tablet Take 1 tablet (40 mg total) by mouth every evening. 07/25/19   Kathi Ludwig, MD  predniSONE (DELTASONE) 20 MG tablet Take 1 tablet (20 mg total) by mouth daily for 5 days. 09/28/19 10/03/19  Chase Picket, MD    Family History Family History  Problem Relation Age of Onset  . Heart disease Mother   . Heart failure Father   . Breast cancer Neg Hx     Social History Social History   Tobacco Use  . Smoking status: Current Every Day Smoker    Packs/day: 0.30    Years: 36.00    Pack years: 10.80    Types: Cigarettes  . Smokeless tobacco: Never Used  Substance Use Topics  . Alcohol use: Yes    Alcohol/week: 0.0 standard drinks    Comment: seldom  . Drug use: Not Currently    Types: Marijuana    Comment: 2-3 times per week for pain     Allergies   Penicillins and Other   Review of Systems Review of Systems  Constitutional: Negative for activity change, chills and fever.  HENT: Negative.   Respiratory: Negative for shortness of breath and stridor.   Endocrine: Negative for cold intolerance and heat intolerance.  Genitourinary: Negative for difficulty urinating, frequency, pelvic pain, urgency and vaginal discharge.  Musculoskeletal: Negative for arthralgias, joint swelling and myalgias.  Skin: Negative for pallor and rash.  Neurological: Positive for numbness. Negative for dizziness, tremors, facial asymmetry, weakness and light-headedness.  Psychiatric/Behavioral: Negative for confusion and decreased concentration.     Physical Exam Triage Vital Signs ED Triage Vitals  Enc Vitals Group     BP 09/28/19 1027 113/67     Pulse Rate 09/28/19 1027 92     Resp 09/28/19 1027 17     Temp 09/28/19 1027 98.2 F (36.8 C)     Temp Source 09/28/19 1027 Oral     SpO2 09/28/19 1027 100 %     Weight 09/28/19 1023 146 lb 3.2 oz (66.3  kg)     Height --      Head Circumference --      Peak Flow --      Pain Score 09/28/19 1022 10     Pain Loc --      Pain Edu? --      Excl. in [place]? --    No data found.  Updated Vital Signs BP 113/67 (BP Location: Right Arm)   Pulse 92   Temp 98.2 F (36.8 C) (Oral)   Resp 17   Wt 66.3 kg   LMP 08/09/2012   SpO2 100%   BMI 23.60 kg/m   Visual Acuity Right Eye Distance:   Left Eye Distance:  Bilateral Distance:    Right Eye Near:   Left Eye Near:    Bilateral Near:     Physical Exam Vitals and nursing note reviewed.  Constitutional:      General: She is not in acute distress.    Appearance: She is not ill-appearing.  Cardiovascular:     Rate and Rhythm: Normal rate and regular rhythm.     Pulses: Normal pulses.     Heart sounds: Normal heart sounds. No murmur. No friction rub.  Abdominal:     General: Bowel sounds are normal. There is no distension.     Tenderness: There is no guarding or rebound.  Musculoskeletal:        General: No swelling, tenderness or deformity. Normal range of motion.  Skin:    Capillary Refill: Capillary refill takes less than 2 seconds.  Neurological:     General: No focal deficit present.     Mental Status: She is alert and oriented to person, place, and time. Mental status is at baseline.     Cranial Nerves: No cranial nerve deficit.     Sensory: Sensory deficit present.     Motor: No weakness.     Coordination: Coordination normal.     Gait: Gait normal.  Psychiatric:        Mood and Affect: Mood normal.      UC Treatments / Results  Labs (all labs ordered are listed, but only abnormal results are displayed) Labs Reviewed  CBC WITH DIFFERENTIAL/PLATELET - Abnormal; Notable for the following components:      Result Value   WBC 10.9 (*)    All other components within normal limits  COMPREHENSIVE METABOLIC PANEL - Abnormal; Notable for the following components:   Glucose, Bld 121 (*)    All other components within  normal limits  VITAMIN D 25 HYDROXY (VIT D DEFICIENCY, FRACTURES) - Abnormal; Notable for the following components:   Vit D, 25-Hydroxy 23.67 (*)    All other components within normal limits  TSH    EKG   Radiology No results found.  Procedures Procedures (including critical care time)  Medications Ordered in UC Medications - No data to display  Initial Impression / Assessment and Plan / UC Course  I have reviewed the triage vital signs and the nursing notes.  Pertinent labs & imaging results that were available during my care of the patient were reviewed by me and considered in my medical decision making (see chart for details).     1.  Bilateral lower extremity numbness (radiculopathy versus peripheral neuropathy): Prednisone 20 mg orally daily x5 days Continue over-the-counter pain medications CBC, CMP, TSH and vitamin D level We will reach out to patient if labs are significant If patient symptoms are persistent, he will need imaging of the lumbar spine. Return precautions given. Final Clinical Impressions(s) / UC Diagnoses   Final diagnoses:  Bilateral leg numbness   Discharge Instructions   None    ED Prescriptions    Medication Sig Dispense Auth. Provider   predniSONE (DELTASONE) 20 MG tablet Take 1 tablet (20 mg total) by mouth daily for 5 days. 5 tablet Curby Carswell, Myrene Galas, MD     PDMP not reviewed this encounter.   Chase Picket, MD 09/28/19 [phone]

## 2019-09-28 NOTE — ED Triage Notes (Signed)
Pt is here with left to right side numbness after her 1st COVID vaccine that was 09/18/2019. Her left hip pain started after work on 09/13/2019, states he was from lifting heavy things from work.

## 2019-10-15 ENCOUNTER — Encounter: Payer: Self-pay | Admitting: *Deleted

## 2019-10-20 ENCOUNTER — Emergency Department (HOSPITAL_COMMUNITY)
Admission: EM | Admit: 2019-10-20 | Discharge: 2019-10-21 | Disposition: A | Discharge status: Home / Self Care | Payer: BC Managed Care – PPO | Attending: Emergency Medicine | Admitting: Emergency Medicine

## 2019-10-20 ENCOUNTER — Encounter (HOSPITAL_COMMUNITY): Payer: Self-pay

## 2019-10-20 DIAGNOSIS — F17210 Nicotine dependence, cigarettes, uncomplicated: Secondary | ICD-10-CM | POA: Insufficient documentation

## 2019-10-20 DIAGNOSIS — R1013 Epigastric pain: Secondary | ICD-10-CM | POA: Diagnosis present

## 2019-10-20 DIAGNOSIS — R110 Nausea: Secondary | ICD-10-CM | POA: Insufficient documentation

## 2019-10-20 DIAGNOSIS — I10 Essential (primary) hypertension: Secondary | ICD-10-CM | POA: Diagnosis not present

## 2019-10-20 DIAGNOSIS — N289 Disorder of kidney and ureter, unspecified: Secondary | ICD-10-CM | POA: Insufficient documentation

## 2019-10-20 DIAGNOSIS — K769 Liver disease, unspecified: Secondary | ICD-10-CM | POA: Insufficient documentation

## 2019-10-20 LAB — CBC
HCT: 38.4 % (ref 36.0–46.0)
Hemoglobin: 12.1 g/dL (ref 12.0–15.0)
MCH: 30.3 pg (ref 26.0–34.0)
MCHC: 31.5 g/dL (ref 30.0–36.0)
MCV: 96 fL (ref 80.0–100.0)
Platelets: 338 10*3/uL (ref 150–400)
RBC: 4 MIL/uL (ref 3.87–5.11)
RDW: 13.6 % (ref 11.5–15.5)
WBC: 15.8 10*3/uL — ABNORMAL HIGH (ref 4.0–10.5)
nRBC: 0 % (ref 0.0–0.2)

## 2019-10-20 LAB — COMPREHENSIVE METABOLIC PANEL
ALT: 25 U/L (ref 0–44)
AST: 28 U/L (ref 15–41)
Albumin: 3.6 g/dL (ref 3.5–5.0)
Alkaline Phosphatase: 54 U/L (ref 38–126)
Anion gap: 8 (ref 5–15)
BUN: 18 mg/dL (ref 6–20)
CO2: 28 mmol/L (ref 22–32)
Calcium: 9.3 mg/dL (ref 8.9–10.3)
Chloride: 103 mmol/L (ref 98–111)
Creatinine, Ser: 0.69 mg/dL (ref 0.44–1.00)
GFR calc Af Amer: >60 mL/min (ref >60)
GFR calc non Af Amer: >60 mL/min (ref >60)
Glucose, Bld: 95 mg/dL (ref 70–99)
Potassium: 4 mmol/L (ref 3.5–5.1)
Sodium: 139 mmol/L (ref 135–145)
Total Bilirubin: 0.8 mg/dL (ref 0.3–1.2)
Total Protein: 7 g/dL (ref 6.5–8.1)

## 2019-10-20 LAB — LIPASE, BLOOD: Lipase: 26 U/L (ref 11–51)

## 2019-10-20 MED ORDER — SENNOSIDES-DOCUSATE SODIUM 8.6-50 MG PO TABS
2.00 | ORAL_TABLET | ORAL | Status: DC
Start: ? — End: 2019-10-20

## 2019-10-20 MED ORDER — MELATONIN 3 MG PO TABS
3.00 | ORAL_TABLET | ORAL | Status: DC
Start: ? — End: 2019-10-20

## 2019-10-20 MED ORDER — LISINOPRIL 20 MG PO TABS
40.00 | ORAL_TABLET | ORAL | Status: DC
Start: 2019-10-20 — End: 2019-10-20

## 2019-10-20 MED ORDER — AMLODIPINE BESYLATE 10 MG PO TABS
10.00 | ORAL_TABLET | ORAL | Status: DC
Start: 2019-10-20 — End: 2019-10-20

## 2019-10-20 MED ORDER — ACETAMINOPHEN 325 MG PO TABS
650.00 | ORAL_TABLET | ORAL | Status: DC
Start: ? — End: 2019-10-20

## 2019-10-20 MED ORDER — NICOTINE 14 MG/24HR TD PT24
1.00 | MEDICATED_PATCH | TRANSDERMAL | Status: DC
Start: 2019-10-20 — End: 2019-10-20

## 2019-10-20 MED ORDER — SODIUM CHLORIDE 0.9% FLUSH: 3.0000 mL | Freq: Once | INTRAVENOUS | Status: DC

## 2019-10-20 MED ORDER — ENOXAPARIN SODIUM 40 MG/0.4ML ~~LOC~~ SOLN
40.00 | SUBCUTANEOUS | Status: DC
Start: 2019-10-19 — End: 2019-10-20

## 2019-10-20 MED ORDER — LIDOCAINE HCL 1 % IJ SOLN
0.50 | INTRAMUSCULAR | Status: DC
Start: ? — End: 2019-10-20

## 2019-10-20 MED ORDER — PRAVASTATIN SODIUM 20 MG PO TABS
40.00 | ORAL_TABLET | ORAL | Status: DC
Start: 2019-10-19 — End: 2019-10-20

## 2019-10-20 NOTE — ED Triage Notes (Signed)
Onset 2 months intermittant upper abd pain.  Pt d/c from [facility] [place] yesterday for numbness in LE.  Pt was not having abd pain while in hospital, re-started after she got home.

## 2019-10-21 ENCOUNTER — Emergency Department (HOSPITAL_COMMUNITY): Payer: BC Managed Care – PPO

## 2019-10-21 LAB — URINALYSIS, ROUTINE W REFLEX MICROSCOPIC
Bilirubin Urine: NEGATIVE
Glucose, UA: NEGATIVE mg/dL
Ketones, ur: NEGATIVE mg/dL
Nitrite: NEGATIVE
Protein, ur: NEGATIVE mg/dL
Specific Gravity, Urine: 1.025 (ref 1.005–1.030)
pH: 5 (ref 5.0–8.0)

## 2019-10-21 LAB — TROPONIN I (HIGH SENSITIVITY)
Troponin I (High Sensitivity): 4 ng/L (ref <18)
Troponin I (High Sensitivity): 4 ng/L (ref <18)

## 2019-10-21 MED ORDER — PANTOPRAZOLE SODIUM 40 MG PO TBEC
40.0000 mg | DELAYED_RELEASE_TABLET | Freq: Once | ORAL | Status: AC
  Administered 2019-10-21: 40 mg via ORAL
  Filled 2019-10-21: qty 1

## 2019-10-21 MED ORDER — PANTOPRAZOLE SODIUM 20 MG PO TBEC: 20.0000 mg | DELAYED_RELEASE_TABLET | Freq: Every day | ORAL | 0 refills | Status: DC

## 2019-10-21 MED ORDER — IOHEXOL 300 MG/ML  SOLN
100.0000 mL | Freq: Once | INTRAMUSCULAR | Status: AC | PRN
  Administered 2019-10-21: 100 mL via INTRAVENOUS

## 2019-10-21 NOTE — ED Notes (Signed)
Pt gone to xray, will return for blood draw

## 2019-10-21 NOTE — ED Provider Notes (Signed)
[facility] EMERGENCY DEPARTMENT Provider Note   CSN: [id number] Arrival date & time: 10/20/19  2214     History Chief Complaint  Patient presents with  . Abdominal Pain    Lauren [name] is a 54 y.o. female with multiple medical problems including ventral hernia repair approx 1 year ago presents to the Emergency Department complaining of gradual, persistent, progressively worsening upper abdominal pain onset "for awhile" and assocaited with constipation.  Pt reports she is normally able to have a BM after taking a laxative. She reports several days of the pain and BM with laxative last night however, tonight around 8pm her pain worsened acutely and began to radiate into her chest.  She reports she has had chest pain before but tonight felt different and somewhat like "reflux."  Pt reports she believes her mesh from the hernia repair is causing the problems.  Nothing seems to make the symptoms better or worse.  Denies fever, chills, headache, vomiting, diarrhea, weakness, dizziness, syncope, urinary symptoms.    Records reviewed, pt was discharged yesterday from [facility] after having leg weakness and receiving steroids.  She reports no abd pain or chest pain during that time.     The history is provided by the patient and medical records. No language interpreter was used.    HPI: A 54 year old patient with a history of hypertension and hypercholesterolemia presents for evaluation of chest pain. Initial onset of pain was approximately 3-6 hours ago. The patient's chest pain is described as heaviness/pressure/tightness and is worse with exertion. The patient complains of nausea. The patient's chest pain is not middle- or left-sided, is not well-localized, is not sharp and does radiate to the arms/jaw/neck. The patient denies diaphoresis. The patient has smoked in the past 90 days. The patient has no history of stroke, has no history of peripheral artery disease, denies any history  of treated diabetes, has no relevant family history of coronary artery disease (first degree relative at less than age 34) and does not have an elevated BMI (>=30).   Past Medical History:  Diagnosis Date  . Allergy    seasonal  . Boils 12/15/2010   multiple axilla  . Carpal tunnel syndrome of left wrist   . Chronic headaches 2008    improving  . Constipation   . Constipation 03/19/2015  . DDD (degenerative disc disease), cervical   . De Quervain's disease (tenosynovitis)    Right per pt  . Encounter for Papanicolaou smear for cervical cancer screening 12/08/2016  . Epigastric pain 09/21/2017  . Heart murmur    History of Heart murmur as child.  Marland Kitchen Heart palpitations   . Heartburn    Occ  . HLD (hyperlipidemia) 2008  . Hypertension   . Menorrhagia   . OA (osteoarthritis) of neck   . Palpable abdominal aorta 09/21/2017  . Right hip pain   . Right ovarian cyst 11/16/2009   2 small, noted on pelvic US  . Seizures ([place])    history of one time  . Shoulder pain, right 07/2008  . Ventral hernia   . Weight loss 12/30/2017  . Weight loss 12/30/2017    Patient Active Problem List   Diagnosis Date Noted  . Sleep deprivation 01/10/2019  . Recurrent abdominal hernia without obstruction or gangrene 09/21/2017  . De Quervain's tenosynovitis, left 08/10/2017  . Encounter for Papanicolaou smear for cervical cancer screening 12/08/2016  . Seasonal allergies 12/08/2016  . Carpal tunnel syndrome 12/08/2016  . Cervical radiculopathy 01/19/2016  .  SVT (supraventricular tachycardia) ([place]) 09/10/2015  . Colon cancer screening 03/19/2015  . Smoking 1/2 pack a day or less 03/14/2014  . Dyslipidemia 04/07/2007  . Essential hypertension 05/20/2006    Past Surgical History:  Procedure Laterality Date  . COLONOSCOPY W/ POLYPECTOMY  12/24/2015  . INSERTION OF MESH N/A 10/25/2017   Procedure: INSERTION OF MESH;  Surgeon: Kinsinger, Arta Bruce, MD;  Location: [facility];  Service:  General;  Laterality: N/A;  . VENTRAL HERNIA REPAIR N/A 10/25/2017   Procedure: LAPAROSCOPIC VENTRAL HERNIA;  Surgeon: Kieth Brightly Arta Bruce, MD;  Location: [facility];  Service: General;  Laterality: N/A;     OB History   No obstetric history on file.     Family History  Problem Relation Age of Onset  . Heart disease Mother   . Heart failure Father   . Breast cancer Neg Hx     Social History   Tobacco Use  . Smoking status: Current Every Day Smoker    Packs/day: 0.30    Years: 36.00    Pack years: 10.80    Types: Cigarettes  . Smokeless tobacco: Never Used  Substance Use Topics  . Alcohol use: Yes    Alcohol/week: 0.0 standard drinks    Comment: seldom  . Drug use: Not Currently    Types: Marijuana    Comment: 2-3 times per week for pain    Home Medications Prior to Admission medications   Medication Sig Start Date End Date Taking? Authorizing Provider  amLODipine (NORVASC) 10 MG tablet TAKE 1 TABLET(10 MG) BY MOUTH DAILY Patient taking differently: Take 10 mg by mouth daily.  03/14/19  Yes Kathi Ludwig, MD  lisinopril (ZESTRIL) 40 MG tablet TAKE 1 TABLET BY MOUTH EVERY DAY Patient taking differently: Take 40 mg by mouth daily.  06/12/19  Yes Kathi Ludwig, MD  Multiple Vitamins-Minerals (CENTRUM SILVER 50+WOMEN PO) Take by mouth.   Yes [provider]  polyethylene glycol powder (GLYCOLAX/MIRALAX) powder Take 17 g by mouth once. Patient taking differently: Take 17 g by mouth as needed for mild constipation.  03/19/15  Yes Burns, Arloa Koh, MD  pravastatin (PRAVACHOL) 40 MG tablet Take 1 tablet (40 mg total) by mouth every evening. 07/25/19  Yes Kathi Ludwig, MD  ibuprofen (ADVIL,MOTRIN) 800 MG tablet Take 1 tablet (800 mg total) by mouth every 8 (eight) hours as needed. Patient not taking: Reported on 10/21/2019 10/25/17   Kinsinger, Arta Bruce, MD  pantoprazole (PROTONIX) 20 MG tablet Take 1 tablet (20 mg total) by mouth daily.  10/21/19   Georgiann Neider, Jarrett Soho, PA-C    Allergies    Penicillins and Other  Review of Systems   Review of Systems  Constitutional: Negative for appetite change, diaphoresis, fatigue, fever and unexpected weight change.  HENT: Negative for mouth sores.   Eyes: Negative for visual disturbance.  Respiratory: Negative for cough, chest tightness, shortness of breath and wheezing.   Cardiovascular: Positive for chest pain.  Gastrointestinal: Positive for abdominal pain and nausea. Negative for constipation, diarrhea and vomiting.  Endocrine: Negative for polydipsia, polyphagia and polyuria.  Genitourinary: Negative for dysuria, frequency, hematuria and urgency.  Musculoskeletal: Negative for back pain and neck stiffness.  Skin: Negative for rash.  Allergic/Immunologic: Negative for immunocompromised state.  Neurological: Negative for syncope, light-headedness and headaches.  Hematological: Does not bruise/bleed easily.  Psychiatric/Behavioral: Negative for sleep disturbance. The patient is not nervous/anxious.     Physical Exam Updated Vital Signs BP (!) 145/96 (BP Location: Right Arm)  Pulse 96   Temp 98.6 F (37 C) (Oral)   Resp 18   LMP 08/09/2012   SpO2 98%   Physical Exam Vitals and nursing note reviewed.  Constitutional:      General: She is not in acute distress.    Appearance: She is not diaphoretic.  HENT:     Head: Normocephalic.  Eyes:     General: No scleral icterus.    Conjunctiva/sclera: Conjunctivae normal.  Cardiovascular:     Rate and Rhythm: Normal rate and regular rhythm.     Pulses: Normal pulses.          Radial pulses are 2+ on the right side and 2+ on the left side.  Pulmonary:     Effort: No tachypnea, accessory muscle usage, prolonged expiration, respiratory distress or retractions.     Breath sounds: Normal breath sounds. No stridor.     Comments: Equal chest rise. No increased work of breathing. Abdominal:     General: There is no distension.      Palpations: Abdomen is soft.     Tenderness: There is abdominal tenderness in the epigastric area. There is no right CVA tenderness, left CVA tenderness, guarding or rebound.  Musculoskeletal:     Cervical back: Normal range of motion.     Comments: Moves all extremities equally and without difficulty.  Skin:    General: Skin is warm and dry.     Capillary Refill: Capillary refill takes less than 2 seconds.  Neurological:     Mental Status: She is alert.     GCS: GCS eye subscore is 4. GCS verbal subscore is 5. GCS motor subscore is 6.     Comments: Speech is clear and goal oriented.  Psychiatric:        Mood and Affect: Mood normal.     ED Results / Procedures / Treatments   Labs (all labs ordered are listed, but only abnormal results are displayed) Labs Reviewed  CBC - Abnormal; Notable for the following components:      Result Value   WBC 15.8 (*)    All other components within normal limits  URINALYSIS, ROUTINE W REFLEX MICROSCOPIC - Abnormal; Notable for the following components:   Hgb urine dipstick MODERATE (*)    Leukocytes,Ua MODERATE (*)    Bacteria, UA RARE (*)    All other components within normal limits  URINE CULTURE  LIPASE, BLOOD  COMPREHENSIVE METABOLIC PANEL  TROPONIN I (HIGH SENSITIVITY)  TROPONIN I (HIGH SENSITIVITY)    EKG EKG Interpretation  Date/Time:  Saturday Oct 20 2019 22:33:38 EDT Ventricular Rate:  77 PR Interval:  154 QRS Duration: 74 QT Interval:  358 QTC Calculation: 405 R Axis:   45 Text Interpretation: Normal sinus rhythm Confirmed by Randal Buba, April ([id number]) on 10/21/2019 6:31:24 AM    Radiology CT ABDOMEN PELVIS W CONTRAST  Result Date: 10/21/2019 CLINICAL DATA:  Upper abdominal pain EXAM: CT ABDOMEN AND PELVIS WITH CONTRAST TECHNIQUE: Multidetector CT imaging of the abdomen and pelvis was performed using the standard protocol following bolus administration of intravenous contrast. CONTRAST:  157mL OMNIPAQUE IOHEXOL 300 MG/ML   SOLN COMPARISON:  None. FINDINGS: Lower chest: Lung bases are clear. Hepatobiliary: Several small hypodense lesions in the LEFT hepatic lobe have low attenuation most consistent benign cysts. Gallbladder normal Pancreas: Pancreas is normal. No ductal dilatation. No pancreatic inflammation. Spleen: Normal spleen Adrenals/urinary tract: Adrenal glands normal. Extending from lower pole the RIGHT kidney is 18 mm rounded lesion which is intermediate  density on postcontrast imaging (image 40/3). Several additional cortical hypodensities in both kidneys which are too small to characterize. Ureters and bladder normal. Stomach/Bowel: Stomach, small bowel, appendix, and cecum are normal. Multiple diverticula of the descending colon and sigmoid colon without acute inflammation. Vascular/Lymphatic: Abdominal aorta is normal caliber with atherosclerotic calcification. There is no retroperitoneal or periportal lymphadenopathy. No pelvic lymphadenopathy. Reproductive: Uterus and adnexa unremarkable. Other: No free fluid. Musculoskeletal: No aggressive osseous lesion. IMPRESSION: 1. No explanation for abdominal pain. 2. Indeterminate RIGHT renal lesion is potentially a hemorrhagic cyst; however, recommend MRI with without contrast for further characterization to exclude enhancing renal lesion. 3. Probable benign hepatic cysts. (These could also be assessed with MRI with the above renal MRI) 4.  Aortic Atherosclerosis (ICD10-I70.0). 5. LEFT colon diverticulosis without evidence diverticulitis Electronically Signed   By: Suzy Bouchard M.D.   On: 10/21/2019 05:28   DG ABD ACUTE 2+V W 1V CHEST  Result Date: 10/21/2019 CLINICAL DATA:  Intermittent upper abdominal pain EXAM: DG ABDOMEN ACUTE W/ 1V CHEST COMPARISON:  A 13 10 FINDINGS: Supine and upright frontal views of the abdomen as well as an upright frontal view of the chest are obtained. The cardiac silhouette is unremarkable. No airspace disease, effusion, or pneumothorax.  Bowel gas pattern is unremarkable with no obstruction or ileus. No masses or abnormal calcifications. No free gas in the greater peritoneal sac. No acute bony abnormalities. IMPRESSION: 1. Unremarkable abdominal series. Electronically Signed   By: Randa Ngo M.D.   On: 10/21/2019 02:38    Procedures Procedures (including critical care time)  Medications Ordered in ED Medications  sodium chloride flush (NS) 0.9 % injection 3 mL (has no administration in time range)  pantoprazole (PROTONIX) EC tablet 40 mg (has no administration in time range)  iohexol (OMNIPAQUE) 300 MG/ML solution 100 mL (100 mLs Intravenous Contrast Given 10/21/19 0417)    ED Course  I have reviewed the triage vital signs and the nursing notes.  Pertinent labs & imaging results that were available during my care of the patient were reviewed by me and considered in my medical decision making (see chart for details).  Clinical Course as of Oct 20 633  Sun Oct 21, 2019  [id number] Large amount of gas and stool is noted however no obstructive pattern.  DG ABD ACUTE 2+V W 1V CHEST [HM]    Clinical Course User Index [HM] Brandalyn Harting, Gwenlyn Perking   MDM Rules/Calculators/A&P HEAR Score: 5                     Patient presents with epigastric abdominal pain that radiates into her chest for several hours.  She reports it was worse with exertion and she did have some associated nausea.  No syncope or near syncope.  EKG within normal limits.  Abdomen tender in the epigastrium.  She does have a leukocytosis however was recently given steroids for an inflammation in her back.  Urinalysis shows hemoglobin, leukocytes, white blood cells and rare bacteria.  She denies urinary symptoms.  Urine culture sent.  Troponin pending.  Lipase within normal limits.  Plain films of chest and abdomen are without acute abnormality.  I personally evaluated these images.  CT scan pending.  6:27 AM Patient resting comfortably.  EKG without ischemia,  initial and repeat troponin within normal limits.  CT scan without acute abdominal pathology.  Right renal cyst is noted.  Patient will need close outpatient follow-up for this including further imaging.  Suspect possible  reflux versus peptic ulcer disease.  Patient started on Protonix.  She will need close GI follow-up for this.  Discussed findings and needed follow-up with patient who states understanding and is in agreement with the plan.   Final Clinical Impression(s) / ED Diagnoses Final diagnoses:  Epigastric pain  Renal lesion  Hepatic lesion    Rx / DC Orders ED Discharge Orders         Ordered    pantoprazole (PROTONIX) 20 MG tablet  Daily     10/21/19 0628           Abrial Arrighi, Jarrett Soho, PA-C 10/21/19 [id number]    Palumbo, April, MD 10/21/19 [phone]

## 2019-10-21 NOTE — Discharge Instructions (Signed)
1. Medications: Protonix, usual home medications 2. Treatment: rest, drink plenty of fluids, advance diet slowly 3. Follow Up: Please followup with your primary doctor in 2 days for discussion of your diagnoses and further evaluation after today's visit; if you do not have a primary care doctor use the resource guide provided to find one; Please return to the ER for persistent vomiting, high fevers or worsening symptoms

## 2019-10-21 NOTE — ED Notes (Signed)
Patient transported to CT 

## 2019-10-21 NOTE — ED Notes (Signed)
Iv team at bedside  

## 2019-10-21 NOTE — ED Notes (Signed)
Patient verbalized understanding of dc instructions, vss, ambulatory with nad.   

## 2019-10-21 NOTE — ED Notes (Signed)
Pt gone to Ct will return for labs

## 2019-10-21 NOTE — ED Notes (Signed)
PA Muthersbaugh at bedside. 

## 2019-10-22 LAB — URINE CULTURE: Culture: 60000 — AB

## 2019-10-23 ENCOUNTER — Telehealth: Payer: Self-pay | Admitting: *Deleted

## 2019-10-23 NOTE — Telephone Encounter (Signed)
Post ED Visit - Positive Culture Follow-up  Culture report reviewed by antimicrobial stewardship pharmacist: [place] Team []  Elenor Quinones, Pharm.D. []  Heide Guile, Pharm.D., BCPS AQ-ID []  Parks Neptune, Pharm.D., BCPS []  Alycia Rossetti, Pharm.D., BCPS []  Belk, Pharm.D., BCPS, AAHIVP []  Legrand Como, Pharm.D., BCPS, AAHIVP []  Salome Arnt, PharmD, BCPS []  Johnnette Gourd, PharmD, BCPS []  Hughes Better, PharmD, BCPS []  Leeroy Cha, PharmD []  Laqueta Linden, PharmD, BCPS []  Albertina Parr, PharmD  [place] Team []  Leodis Sias, PharmD []  Lindell Spar, PharmD []  Royetta Asal, PharmD []  Graylin Shiver, Rph []  Rema Fendt) Glennon Mac, PharmD []  Arlyn Dunning, PharmD []  Netta Cedars, PharmD []  Dia Sitter, PharmD []  Leone Haven, PharmD []  Gretta Arab, PharmD []  Theodis Shove, PharmD []  Peggyann Juba, PharmD []  Reuel Boom, PharmD   Positive urine culture Reviewed by Emeterio Reeve PA and no further patient follow-up is required at this time.  Harlon Flor [facility] 10/23/2019, 10:40 AM

## 2019-10-24 ENCOUNTER — Encounter: Payer: Self-pay | Admitting: Nurse Practitioner

## 2019-10-24 ENCOUNTER — Telehealth: Payer: Self-pay

## 2019-10-24 ENCOUNTER — Ambulatory Visit (INDEPENDENT_AMBULATORY_CARE_PROVIDER_SITE_OTHER): Payer: BC Managed Care – PPO | Admitting: Nurse Practitioner

## 2019-10-24 ENCOUNTER — Encounter: Payer: Self-pay | Admitting: Internal Medicine

## 2019-10-24 ENCOUNTER — Other Ambulatory Visit: Payer: Self-pay

## 2019-10-24 VITALS — BP 106/62 | HR 84 | Temp 96.9°F | Ht 66.0 in | Wt 147.2 lb

## 2019-10-24 DIAGNOSIS — Z86010 Personal history of colonic polyps: Secondary | ICD-10-CM | POA: Diagnosis not present

## 2019-10-24 DIAGNOSIS — K5900 Constipation, unspecified: Secondary | ICD-10-CM | POA: Diagnosis not present

## 2019-10-24 DIAGNOSIS — N281 Cyst of kidney, acquired: Secondary | ICD-10-CM | POA: Insufficient documentation

## 2019-10-24 DIAGNOSIS — R1010 Upper abdominal pain, unspecified: Secondary | ICD-10-CM

## 2019-10-24 NOTE — Telephone Encounter (Signed)
-----   Message from [name], NP sent at 10/24/2019  4:32 PM EDT ----- [name], could you please put this patient in for a colonoscopy recall for July 2024.  Thank you

## 2019-10-24 NOTE — Patient Instructions (Signed)
If you are age 54 or older, your body mass index should be between 23-30. Your Body mass index is 23.76 kg/m. If this is out of the aforementioned range listed, please consider follow up with your Primary Care Provider.  If you are age 20 or younger, your body mass index should be between 19-25. Your Body mass index is 23.76 kg/m. If this is out of the aformentioned range listed, please consider follow up with your Primary Care Provider.   Complete your Pantoprazole as previously prescribed.  START using Benefiber daily.  You have been scheduled to follow up with Tye Savoy, PA-C on November 14, 2019 at 10:30 am.

## 2019-10-24 NOTE — Progress Notes (Signed)
ASSESSMENT / PLAN:   54 year old female with PMH significant for hypertension, hyperlipidemia  # Acute, diffuse upper abdominal pain --Etiology unclear.  She connects epigastric pain and constipation but I am not convinced they are related. --Occurred 5/8, CMP, lipase and CT scan in ED unremarkable . WBC high but had recent steroids for back --Pain resolved after starting daily PPI --Hold off on further evaluation for now since pain resolved. Complete remainder of Protonix then follow up with me in 3-4 weeks. Goal will be to get her off PPI in near further. If recurrent pain then can discuss EGD.   # Chronic constipation --Start daily Benefiver  # History of colon polyps --Colonoscopy July 2017 by [name] for heme positive stool --2 diminutive tubular adenomatous polyps removed --Probably needs 5-year surveillance colonoscopy in July 2022 but  will defer to Dr. Ardis [name]   HPI:     Chief Complaint: ED follow-up for upper abdominal pain   [name] is a 54 year old female, new to the practice, here for evaluation of upper abdominal pain.    10/20/19- seen in the ED with chest pain and also progressive upper abdominal pain associated with constipation.  In the ED her liver test and lipase were normal.  Troponin normal.  Non concerning EKG. Hemoglobin normal at 12.1.  WBC was elevated at 15.8 ( recent steroids for back). Two view abdomen films negative. CTAP with contrast -no acute findings.  Indeterminate right renal lesion possibly hemorrhagic cyst.  Left diverticulosis without diverticulitis. She was released with Rx of protonix.   Patient that she has intermittent constipation. On 5/8 she was constipated and developed diffuse upper abdominal pain. She took a laxative, had a BM and pain resolved. Several hours later the upper abdominal pain recurred after eating spicy foods so she went to ED. No associated N/V.  No history of significant NSAID use. No hx of PUD.  Since  starting Protonix she hasn't had any further upper abdominal pain. Patient occasionally gets heartburn alleviated by TUMs.    Past Medical History:  Diagnosis Date  . Allergy    seasonal  . Boils 12/15/2010   multiple axilla  . Carpal tunnel syndrome of left wrist   . Chronic headaches 2008    improving  . Constipation   . Constipation 03/19/2015  . DDD (degenerative disc disease), cervical   . De Quervain's disease (tenosynovitis)    Right per pt  . Encounter for Papanicolaou smear for cervical cancer screening 12/08/2016  . Epigastric pain 09/21/2017  . Heart murmur    History of Heart murmur as child.  Marland Kitchen Heart palpitations   . Heartburn    Occ  . HLD (hyperlipidemia) 2008  . Hypertension   . Menorrhagia   . OA (osteoarthritis) of neck   . Palpable abdominal aorta 09/21/2017  . Right hip pain   . Right ovarian cyst 11/16/2009   2 small, noted on pelvic US  . Seizures ([name])    history of one time  . Shoulder pain, right 07/2008  . Ventral hernia   . Weight loss 12/30/2017     Past Surgical History:  Procedure Laterality Date  . COLONOSCOPY W/ POLYPECTOMY  12/24/2015  . INSERTION OF MESH N/A 10/25/2017   Procedure: INSERTION OF MESH;  Surgeon: [name], Lauren Bruce, MD;  Location: [name];  Service: General;  Laterality: N/A;  . VENTRAL HERNIA REPAIR N/A 10/25/2017   Procedure:  LAPAROSCOPIC VENTRAL HERNIA;  Surgeon: [name], Lauren Bruce, MD;  Location: [facility];  Service: General;  Laterality: N/A;   Family History  Problem Relation Age of Onset  . Brain cancer Mother   . Heart failure Father   . Cirrhosis Brother        alcohol related  . Heart disease Maternal Grandmother   . Breast cancer Neg Hx   . Colon cancer Neg Hx   . Esophageal cancer Neg Hx   . Stomach cancer Neg Hx    Social History   Tobacco Use  . Smoking status: Current Every Day Smoker    Packs/day: 0.30    Years: 36.00    Pack years: 10.80    Types:  Cigarettes  . Smokeless tobacco: Never Used  Substance Use Topics  . Alcohol use: Yes    Alcohol/week: 0.0 standard drinks    Comment: seldom  . Drug use: Not Currently    Types: Marijuana    Comment: 2-3 times per week for pain   Current Outpatient Medications  Medication Sig Dispense Refill  . amLODipine (NORVASC) 10 MG tablet TAKE 1 TABLET(10 MG) BY MOUTH DAILY (Patient taking differently: Take 10 mg by mouth daily. ) 90 tablet 3  . lisinopril (ZESTRIL) 40 MG tablet TAKE 1 TABLET BY MOUTH EVERY DAY (Patient taking differently: Take 40 mg by mouth daily. ) 90 tablet 3  . Multiple Vitamins-Minerals (CENTRUM SILVER 50+WOMEN PO) Take by mouth.    . pantoprazole (PROTONIX) 20 MG tablet Take 1 tablet (20 mg total) by mouth daily. 30 tablet 0  . pravastatin (PRAVACHOL) 40 MG tablet Take 1 tablet (40 mg total) by mouth every evening. 90 tablet 3   No current facility-administered medications for this visit.   Allergies  Allergen Reactions  . Penicillins Anaphylaxis, Hives and Swelling  . Other Other (See Comments) and Swelling    Banana - Swelling and hives. Banana - Swelling and hives.     Review of Systems: All systems reviewed and negative except where noted in HPI.   Serum creatinine: 0.69 mg/dL 10/20/19 2248 Estimated creatinine clearance: 75.3 mL/min   Physical Exam:    Wt Readings from Last 3 Encounters:  10/24/19 147 lb 3.2 oz (66.8 kg)  09/28/19 146 lb 3.2 oz (66.3 kg)  07/25/19 146 lb 9.6 oz (66.5 kg)    BP 106/62   Pulse 84   Temp (!) 96.9 F (36.1 C)   Ht 5\' 6"  (1.676 m)   Wt 147 lb 3.2 oz (66.8 kg)   LMP 08/09/2012   BMI 23.76 kg/m  Constitutional:  Pleasant female in no acute distress. Psychiatric: Normal mood and affect. Behavior is normal. EENT: Pupils normal.  Conjunctivae are normal. No scleral icterus. Neck supple.  Cardiovascular: Normal rate, regular rhythm. No edema Pulmonary/chest: Effort normal and breath sounds normal. No wheezing, rales  or rhonchi. Abdominal: Soft, nondistended, nontender. Bowel sounds active throughout. There are no masses palpable. No hepatomegaly. Neurological: Alert and oriented to person place and time. Skin: Skin is warm and dry. No rashes noted.  Tye Savoy, NP  10/24/2019, 11:33 AM

## 2019-10-24 NOTE — Progress Notes (Signed)
I agree with the abgove note, plan. Current polyp surveillance guidelines state that her next colonoscopy should be at 7 year interval, not 5 year.  Please put in recall for 12/2022.  thanks

## 2019-11-14 ENCOUNTER — Encounter: Payer: Self-pay | Admitting: Nurse Practitioner

## 2019-11-14 ENCOUNTER — Ambulatory Visit (INDEPENDENT_AMBULATORY_CARE_PROVIDER_SITE_OTHER): Payer: BC Managed Care – PPO | Admitting: Nurse Practitioner

## 2019-11-14 VITALS — BP 100/64 | HR 71 | Ht 66.0 in | Wt 149.5 lb

## 2019-11-14 DIAGNOSIS — R1010 Upper abdominal pain, unspecified: Secondary | ICD-10-CM

## 2019-11-14 DIAGNOSIS — K5900 Constipation, unspecified: Secondary | ICD-10-CM

## 2019-11-14 NOTE — Patient Instructions (Signed)
If you are age 54 or older, your body mass index should be between 23-30. Your Body mass index is 24.13 kg/m. If this is out of the aforementioned range listed, please consider follow up with your Primary Care Provider.  If you are age 22 or younger, your body mass index should be between 19-25. Your Body mass index is 24.13 kg/m. If this is out of the aformentioned range listed, please consider follow up with your Primary Care Provider.   Continue Benefiber.   Stop Pantoprazole.   Call if any recurrent abdominal pain or constipation.

## 2019-11-14 NOTE — Progress Notes (Signed)
I agree with the above note, plan 

## 2019-11-14 NOTE — Progress Notes (Signed)
IMPRESSION and PLAN:    54 year old female with PMH significant for hypertension, hyperlipidemia  # Upper abdominal pain, resolved.  --Here for follow up . Has nearly completed a month of PPI prescribed by ED on 10/20/19. CT scan and labs in ED were unrevealing.  --Stop PPI --Call if recurrent pain  # Chronic constipation --Resolved. Continue good hydration and Benefiber TID. --Call if recurrent problems.    HPI:    Primary GI: Lauren Caprice, MD       Chief complaint :  Follow up on abdominal pain and constipation    10/24/19 Saw in office for evaluation of upper abdominal pain and constipation. She had been to the ED, CT scan and labs unrevealing except for leukocytosis but she had been on steroids. Her abdominal pain had already improved. I kept her on PPI prescribed by ED. She was started on fiber. Here for follow up  HISTORY SINCE LAST VISIT: [name] feels much better. She hasn't had a recurrence of upper abdominal pain and is having a BM QOD on Benefiber TID. She is please with bowel habits. Tolerating 5 bottles of water / day.   Review of systems:     No chest pain, no SOB, no fevers, no urinary sx   Past Medical History:  Diagnosis Date  . Allergy    seasonal  . Boils 12/15/2010   multiple axilla  . Carpal tunnel syndrome of left wrist   . Chronic headaches 2008    improving  . Constipation   . Constipation 03/19/2015  . DDD (degenerative disc disease), cervical   . De Quervain's disease (tenosynovitis)    Right per pt  . Encounter for Papanicolaou smear for cervical cancer screening 12/08/2016  . Epigastric pain 09/21/2017  . Heart murmur    History of Heart murmur as child.  Marland Kitchen Heart palpitations   . Heartburn    Occ  . HLD (hyperlipidemia) 2008  . Hypertension   . Menorrhagia   . OA (osteoarthritis) of neck   . Palpable abdominal aorta 09/21/2017  . Right hip pain   . Right ovarian cyst 11/16/2009   2 small, noted on pelvic US  . Seizures  ([place])    history of one time  . Shoulder pain, right 07/2008  . Ventral hernia   . Weight loss 12/30/2017    Patient's surgical history, family medical history, social history, medications and allergies were all reviewed in Epic   Creatinine clearance cannot be calculated (Patient's most recent lab result is older than the maximum 21 days allowed.)  Current Outpatient Medications  Medication Sig Dispense Refill  . amLODipine (NORVASC) 10 MG tablet TAKE 1 TABLET(10 MG) BY MOUTH DAILY (Patient taking differently: Take 10 mg by mouth daily. ) 90 tablet 3  . lisinopril (ZESTRIL) 40 MG tablet TAKE 1 TABLET BY MOUTH EVERY DAY (Patient taking differently: Take 40 mg by mouth daily. ) 90 tablet 3  . Multiple Vitamins-Minerals (CENTRUM SILVER 50+WOMEN PO) Take by mouth.    . pantoprazole (PROTONIX) 20 MG tablet Take 1 tablet (20 mg total) by mouth daily. 30 tablet 0  . pravastatin (PRAVACHOL) 40 MG tablet Take 1 tablet (40 mg total) by mouth every evening. 90 tablet 3   No current facility-administered medications for this visit.    Physical Exam:     BP 100/64   Pulse 71   Ht 5\' 6"  (1.676 m)   Wt 149 lb 8 oz (67.8  kg)   LMP 08/09/2012   BMI 24.13 kg/m   GENERAL:  Pleasant female in NAD PSYCH: : Cooperative, normal affect CARDIAC:  RRR ABDOMEN:  Nondistended, soft, nontender. No obvious masses, no hepatomegaly,  normal bowel sounds NEURO: Alert and oriented x 3, no focal neurologic deficits   [name] , NP 11/14/2019, 1:04 PM

## 2019-12-25 ENCOUNTER — Other Ambulatory Visit: Payer: Self-pay

## 2019-12-25 ENCOUNTER — Ambulatory Visit (HOSPITAL_COMMUNITY)
Admission: EM | Admit: 2019-12-25 | Discharge: 2019-12-25 | Disposition: A | Discharge status: Home / Self Care | Payer: BC Managed Care – PPO

## 2019-12-25 ENCOUNTER — Encounter (HOSPITAL_COMMUNITY): Payer: Self-pay | Admitting: Emergency Medicine

## 2019-12-25 DIAGNOSIS — Z711 Person with feared health complaint in whom no diagnosis is made: Secondary | ICD-10-CM | POA: Diagnosis not present

## 2019-12-25 NOTE — Discharge Instructions (Signed)
Continue current medications  Follow up as needed

## 2019-12-25 NOTE — ED Provider Notes (Signed)
[place]    CSN: [id number] Arrival date & time: 12/25/19  1515      History   Chief Complaint Chief Complaint  Patient presents with  . Hypotension    HPI [name] is a 54 y.o. female with past medical history of hypertension, hyperlipidemia presents to urgent care after low blood pressure reading at home.  Patient states she uses a wrist cuff and checks BP regularly and became worried when number was 93/56 this morning.  No recent change in medications.  Patient denies any recent headache, dizziness, chest pain, shortness of breath.  No recent change in medications.  Upon initial evaluation urgent care patient found to have a blood pressure of 121/73 verified with recheck.    Past Medical History:  Diagnosis Date  . Allergy    seasonal  . Boils 12/15/2010   multiple axilla  . Carpal tunnel syndrome of left wrist   . Chronic headaches 2008    improving  . Constipation   . Constipation 03/19/2015  . DDD (degenerative disc disease), cervical   . De Quervain's disease (tenosynovitis)    Right per pt  . Encounter for Papanicolaou smear for cervical cancer screening 12/08/2016  . Epigastric pain 09/21/2017  . Heart murmur    History of Heart murmur as child.  Marland Kitchen Heart palpitations   . Heartburn    Occ  . HLD (hyperlipidemia) 2008  . Hypertension   . Menorrhagia   . OA (osteoarthritis) of neck   . Palpable abdominal aorta 09/21/2017  . Right hip pain   . Right ovarian cyst 11/16/2009   2 small, noted on pelvic US  . Seizures ([place])    history of one time  . Shoulder pain, right 07/2008  . Ventral hernia   . Weight loss 12/30/2017    Patient Active Problem List   Diagnosis Date Noted  . Renal cyst, acquired, right 10/24/2019  . Sleep deprivation 01/10/2019  . Recurrent abdominal hernia without obstruction or gangrene 09/21/2017  . De Quervain's tenosynovitis, left 08/10/2017  . Encounter for Papanicolaou smear for cervical cancer screening  12/08/2016  . Seasonal allergies 12/08/2016  . Carpal tunnel syndrome 12/08/2016  . Cervical radiculopathy 01/19/2016  . SVT (supraventricular tachycardia) ([place]) 09/10/2015  . Colon cancer screening 03/19/2015  . Smoking 1/2 pack a day or less 03/14/2014  . Dyslipidemia 04/07/2007  . Essential hypertension 05/20/2006    Past Surgical History:  Procedure Laterality Date  . COLONOSCOPY W/ POLYPECTOMY  12/24/2015  . INSERTION OF MESH N/A 10/25/2017   Procedure: INSERTION OF MESH;  Surgeon: Kinsinger, Arta Bruce, MD;  Location: [facility];  Service: General;  Laterality: N/A;  . VENTRAL HERNIA REPAIR N/A 10/25/2017   Procedure: LAPAROSCOPIC VENTRAL HERNIA;  Surgeon: Kieth Brightly Arta Bruce, MD;  Location: [facility];  Service: General;  Laterality: N/A;    OB History   No obstetric history on file.      Home Medications    Prior to Admission medications   Medication Sig Start Date End Date Taking? Authorizing Provider  amLODipine (NORVASC) 10 MG tablet TAKE 1 TABLET(10 MG) BY MOUTH DAILY Patient taking differently: Take 10 mg by mouth daily.  03/14/19  Yes Kathi Ludwig, MD  gabapentin (NEURONTIN) 300 MG capsule Take by mouth. 11/15/19  Yes [provider]  lisinopril (ZESTRIL) 40 MG tablet TAKE 1 TABLET BY MOUTH EVERY DAY Patient taking differently: Take 40 mg by mouth daily.  06/12/19  Yes Kathi Ludwig,  MD  pravastatin (PRAVACHOL) 40 MG tablet Take 1 tablet (40 mg total) by mouth every evening. 07/25/19  Yes Kathi Ludwig, MD  Multiple Vitamins-Minerals (CENTRUM SILVER 50+WOMEN PO) Take by mouth.    [provider]  pantoprazole (PROTONIX) 20 MG tablet Take 1 tablet (20 mg total) by mouth daily. 10/21/19   Muthersbaugh, Jarrett Soho, PA-C    Family History Family History  Problem Relation Age of Onset  . Brain cancer Mother   . Heart failure Father   . Cirrhosis Brother        alcohol related  . Heart disease Maternal  Grandmother   . Breast cancer Neg Hx   . Colon cancer Neg Hx   . Esophageal cancer Neg Hx   . Stomach cancer Neg Hx     Social History Social History   Tobacco Use  . Smoking status: Current Every Day Smoker    Packs/day: 0.30    Years: 36.00    Pack years: 10.80    Types: Cigarettes  . Smokeless tobacco: Never Used  Vaping Use  . Vaping Use: Never used  Substance Use Topics  . Alcohol use: Yes    Alcohol/week: 0.0 standard drinks    Comment: seldom  . Drug use: Not Currently    Types: Marijuana    Comment: 2-3 times per week for pain     Allergies   Penicillins and Other   Review of Systems As in HPI otherwise negative   Physical Exam Triage Vital Signs ED Triage Vitals  Enc Vitals Group     BP 12/25/19 1601 121/73     Pulse Rate 12/25/19 1601 82     Resp 12/25/19 1601 18     Temp 12/25/19 1601 98.6 F (37 C)     Temp Source 12/25/19 1601 Oral     SpO2 12/25/19 1601 100 %     Weight --      Height --      Head Circumference --      Peak Flow --      Pain Score 12/25/19 1558 4     Pain Loc --      Pain Edu? --      Excl. in [place]? --    No data found.  Updated Vital Signs BP 121/73 (BP Location: Right Arm)   Pulse 82   Temp 98.6 F (37 C) (Oral)   Resp 18   LMP 08/09/2012   SpO2 100%      Physical Exam Constitutional:      Appearance: Normal appearance. She is normal weight.  Neurological:     General: No focal deficit present.     Mental Status: She is alert and oriented to person, place, and time.  Psychiatric:        Mood and Affect: Mood normal.        Behavior: Behavior normal.     UC Treatments / Results  Labs (all labs ordered are listed, but only abnormal results are displayed) Labs Reviewed - No data to display  EKG   Radiology No results found.  Procedures Procedures (including critical care time)  Medications Ordered in UC Medications - No data to display  Initial Impression / Assessment and Plan / UC Course  I  have reviewed the triage vital signs and the nursing notes.  Pertinent labs & imaging results that were available during my care of the patient were reviewed by me and considered in my medical decision making (see chart for details).  Hx hypertension -Blood pressure reading incorrect at home.  Advised patient that she may need new machine.  Normal blood pressure reading in urgent care -New current medicines as prescribed prior to this visit -Follow-up as needed  Final Clinical Impressions(s) / UC Diagnoses   Final diagnoses:  Worried well     Discharge Instructions     Continue current medications. Follow-up as needed.    ED Prescriptions    None     PDMP not reviewed this encounter.   Rudolpho Sevin, NP 12/25/19 1759

## 2019-12-25 NOTE — ED Triage Notes (Addendum)
Patient has been checking blood pressure daily.  Patient reports last blood pressure reading 93/56.  Patient did not call pcp.    Head and chest feel "stuffy"  But no pain

## 2020-01-10 ENCOUNTER — Encounter: Payer: Self-pay | Admitting: Neurology

## 2020-01-10 ENCOUNTER — Telehealth: Payer: Self-pay | Admitting: *Deleted

## 2020-01-10 ENCOUNTER — Ambulatory Visit (INDEPENDENT_AMBULATORY_CARE_PROVIDER_SITE_OTHER): Payer: BC Managed Care – PPO | Admitting: Neurology

## 2020-01-10 VITALS — BP 143/92 | HR 91 | Ht 66.0 in | Wt 149.5 lb

## 2020-01-10 DIAGNOSIS — G373 Acute transverse myelitis in demyelinating disease of central nervous system: Secondary | ICD-10-CM | POA: Diagnosis not present

## 2020-01-10 DIAGNOSIS — H469 Unspecified optic neuritis: Secondary | ICD-10-CM | POA: Diagnosis not present

## 2020-01-10 DIAGNOSIS — G360 Neuromyelitis optica [Devic]: Secondary | ICD-10-CM | POA: Diagnosis not present

## 2020-01-10 DIAGNOSIS — Z79899 Other long term (current) drug therapy: Secondary | ICD-10-CM | POA: Insufficient documentation

## 2020-01-10 MED ORDER — PREDNISONE 50 MG PO TABS: ORAL_TABLET | ORAL | 0 refills | Status: DC

## 2020-01-10 NOTE — Telephone Encounter (Signed)
Request faxed to [place] Dept.

## 2020-01-10 NOTE — Progress Notes (Signed)
[facility]  PATIENT: Lauren [name] DOB: March 02, 1966  REFERRING DOCTOR OR PCP:  Rachell Cipro SOURCE:   _________________________________   HISTORICAL  CHIEF COMPLAINT:  Chief Complaint  Patient presents with  . New Patient (Initial Visit)    RM 13, alone. Paper referral from [place] Shah,MD at [facility] for MOG Antibody Disease/consider Rituxan infusions. Pt reported to Dr. Manuella Ghazi per note that she has right leg numbness/temp loss, weakness in left leg, perianal numbness. Sx started 2 days after MRNA vaccine. Has right eye vision loss, blurry. she can see outlines.     HISTORY OF PRESENT ILLNESS:  I had the pleasure of seeing your patient, Lauren [name], at the [place] [facility] at [facility] for neurologic consultation regarding her transverse myelitis and recent diagnosis of anti-MOG positive neuromyelitis optica.  She is a 54 year old woman who had transverse myelitis 2 days after receiving the first [place] vaccination 09/18/2019.   On 09/20/2019, her left leg went numb.  Over the next 2 days, the right leg started to get numb and the left leg numbness worsen and she became weaker.   She was able to walk though she felt she had a limp.  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  She did not have any numbness or weakness in the arms.  Because symptoms persisted, she went to Urgent care 09/28/19 and had standard labs that were fine.  She was prescribed 20 mg prednisone x 5 days and advised to call back if not better.  Symptoms persisted and she was referred to Dr. Manuella Ghazi..  He saw her 10/15/19 and then sent her that day to [facility] due to Brown-Sequard like symptoms.   She was seen in the emergency room and then admitted.  She was treated with IV Solumedrol for 5 days.   She had a lumbar puncture and her CSF showed oligoclonal bands (9).  Additionally, she had testing for neuromyelitis optica and is anti-MOG positive.   She is anti-NMO negative.   The anti-Maag antibody test had not returned yet when she was discharged.  She followed up with Dr. Manuella Ghazi and is referred for possible DMT therapy.  She received 5 days IV Solumedrol without difficulty.  She reports that strength and sensation improved but not to baseline.  Specifically, the legs have sone numbness left > right and in her groin.   The left leg feels stiff.  Bladder function is fine.    She had the onset of right eye pain about a week ago, worse with movements and over the next couple days she lost vision .  She is currently not able to see anything with central vision and has reduced peripheral vision to the right and inferior left but able to count fingers LUQ of right visual field.    Vision has been stable at current point x 3 days.     She is anti-MOG  MRI C/T spine (10/16/2019): IMPRESSION:  1. Long segment enhancing cord lesion in the left anterolateral cord spanning T10-11 to T12 levels. Differential considerations include NMO, anti-MOG, or leptomeningeal process such as sarcoidosis. 2. No cervical cord lesions.  MRI brain (10/16/2019): IMPRESSION: No acute intracranial abnormality. Minimal, nonspecific nonenhancing white matter hyperintensities are most commonly seen with chronic microvascular disease.   MOG antibody returned positive. Diagnosis MOGAD / NMOSD.    REVIEW OF SYSTEMS: Constitutional: No fevers, chills, sweats, or change in appetite Eyes: No visual  changes, double vision, eye pain Ear, nose and throat: No hearing loss, ear pain, nasal congestion, sore throat Cardiovascular: No chest pain, palpitations Respiratory: No shortness of breath at rest or with exertion.   No wheezes GastrointestinaI: No nausea, vomiting, diarrhea, abdominal pain, fecal incontinence Genitourinary: No dysuria, urinary retention or frequency.  No nocturia. Musculoskeletal: No neck pain, back pain Integumentary: No rash, pruritus, skin  lesions Neurological: as above Psychiatric: No depression at this time.  No anxiety Endocrine: No palpitations, diaphoresis, change in appetite, change in weigh or increased thirst Hematologic/Lymphatic: No anemia, purpura, petechiae. Allergic/Immunologic: No itchy/runny eyes, nasal congestion, recent allergic reactions, rashes  ALLERGIES: Allergies  Allergen Reactions  . Penicillins Anaphylaxis, Hives and Swelling  . Other Other (See Comments) and Swelling    Banana - Swelling and hives. Banana - Swelling and hives.    HOME MEDICATIONS:  Current Outpatient Medications:  .  amLODipine (NORVASC) 10 MG tablet, TAKE 1 TABLET(10 MG) BY MOUTH DAILY (Patient taking differently: Take 10 mg by mouth daily. 1 tablet po TID after meals), Disp: 90 tablet, Rfl: 3 .  gabapentin (NEURONTIN) 300 MG capsule, Take by mouth., Disp: , Rfl:  .  lisinopril (ZESTRIL) 40 MG tablet, TAKE 1 TABLET BY MOUTH EVERY DAY (Patient taking differently: Take 40 mg by mouth daily. ), Disp: 90 tablet, Rfl: 3 .  methylPREDNISolone (MEDROL) 4 MG tablet, Take 4 mg by mouth daily., Disp: , Rfl:  .  Multiple Vitamins-Minerals (CENTRUM SILVER 50+WOMEN PO), Take by mouth., Disp: , Rfl:  .  pantoprazole (PROTONIX) 20 MG tablet, Take 1 tablet (20 mg total) by mouth daily., Disp: 30 tablet, Rfl: 0 .  pravastatin (PRAVACHOL) 40 MG tablet, Take 1 tablet (40 mg total) by mouth every evening., Disp: 90 tablet, Rfl: 3 .  Wheat Dextrin (BENEFIBER PO), Take by mouth., Disp: , Rfl:  .  predniSONE (DELTASONE) 50 MG tablet, Take 20 pills (1000 mg) po daily x 4 days, Disp: 80 tablet, Rfl: 0  PAST MEDICAL HISTORY: Past Medical History:  Diagnosis Date  . Allergy    seasonal  . Boils 12/15/2010   multiple axilla  . Carpal tunnel syndrome of left wrist   . Chronic headaches 2008    improving  . Constipation   . Constipation 03/19/2015  . DDD (degenerative disc disease), cervical   . De Quervain's disease (tenosynovitis)    Right per  pt  . Encounter for Papanicolaou smear for cervical cancer screening 12/08/2016  . Epigastric pain 09/21/2017  . Heart murmur    History of Heart murmur as child.  Marland Kitchen Heart palpitations   . Heartburn    Occ  . HLD (hyperlipidemia) 2008  . Hypertension   . Menorrhagia   . OA (osteoarthritis) of neck   . Palpable abdominal aorta 09/21/2017  . Right hip pain   . Right ovarian cyst 11/16/2009   2 small, noted on pelvic US  . Seizures ([place])    history of one time  . Shoulder pain, right 07/2008  . Ventral hernia   . Weight loss 12/30/2017    PAST SURGICAL HISTORY: Past Surgical History:  Procedure Laterality Date  . COLONOSCOPY W/ POLYPECTOMY  12/24/2015  . INSERTION OF MESH N/A 10/25/2017   Procedure: INSERTION OF MESH;  Surgeon: Kinsinger, Arta Bruce, MD;  [facility];  Service: General;  Laterality: N/A;  . VENTRAL HERNIA REPAIR N/A 10/25/2017   Procedure: LAPAROSCOPIC VENTRAL HERNIA;  Surgeon: Kieth Brightly Arta Bruce, MD;  Location: [facility];  Service: General;  Laterality: N/A;    FAMILY HISTORY: Family History  Problem Relation Age of Onset  . Brain cancer Mother   . Heart failure Father   . Cirrhosis Brother        alcohol related  . Heart disease Maternal Grandmother   . Breast cancer Neg Hx   . Colon cancer Neg Hx   . Esophageal cancer Neg Hx   . Stomach cancer Neg Hx     SOCIAL HISTORY:  Social History   Socioeconomic History  . Marital status: Single    Spouse name: Not on file  . Number of children: Not on file  . Years of education: Not on file  . Highest education level: Not on file  Occupational History  . Not on file  Tobacco Use  . Smoking status: Current Every Day Smoker    Packs/day: 0.30    Years: 36.00    Pack years: 10.80    Types: Cigarettes  . Smokeless tobacco: Never Used  . Tobacco comment: 1/2 pack per day  Vaping Use  . Vaping Use: Never used  Substance and Sexual Activity  . Alcohol use: Yes     Alcohol/week: 0.0 standard drinks    Comment: seldom  . Drug use: Not Currently    Types: Marijuana    Comment: 2-3 times per week for pain  . Sexual activity: Yes    Birth control/protection: Post-menopausal  Other Topics Concern  . Not on file  Social History Narrative   Works as Investment banker, corporate at [employer], lives with significant other in [place]   No EToh, former beer drinker   No illicit drug use.   Current smoker, 1/2 PPDx 26 years   Caffeine use: none   Right handed    Social Determinants of Health   Financial Resource Strain:   . Difficulty of Paying Living Expenses:   Food Insecurity:   . Worried About Charity fundraiser in the Last Year:   . Arboriculturist in the Last Year:   Transportation Needs:   . Film/video editor (Medical):   Marland Kitchen Lack of Transportation (Non-Medical):   Physical Activity:   . Days of Exercise per Week:   . Minutes of Exercise per Session:   Stress:   . Feeling of Stress :   Social Connections:   . Frequency of Communication with Friends and Family:   . Frequency of Social Gatherings with Friends and Family:   . Attends Religious Services:   . Active Member of Clubs or Organizations:   . Attends Archivist Meetings:   Marland Kitchen Marital Status:   Intimate Partner Violence:   . Fear of Current or Ex-Partner:   . Emotionally Abused:   Marland Kitchen Physically Abused:   . Sexually Abused:      PHYSICAL EXAM  Vitals:   01/10/20 0820  BP: (!) 143/92  Pulse: 91  Weight: 149 lb 8 oz (67.8 kg)  Height: 5\' 6"  (1.676 m)    Body mass index is 24.13 kg/m.   General: The patient is well-developed and well-nourished and in no acute distress  HEENT:  Head is [place]/AT.  Sclera are anicteric.  Funduscopic exam shows normal optic discs and retinal vessels.     Visual fields were normal on the left.  On the right, she has perception of shapes throughout the entire visual field but can only count fingers in the upper nasal  quadrant.  Neck: No carotid bruits are noted.  The neck is nontender.  Cardiovascular: The heart has a regular rate and rhythm with a normal S1 and S2. There were no murmurs, gallops or rubs.    Skin: Extremities are without rash or  edema.  Musculoskeletal:  Back is nontender  Neurologic Exam  Mental status: The patient is alert and oriented x 3 at the time of the examination. The patient has apparent normal recent and remote memory, with an apparently normal attention span and concentration ability.   Speech is normal.  Cranial nerves: Extraocular movements are full. Pupils show 2+ right.  Facial symmetry is present. There is good facial sensation to soft touch bilaterally. Facial strength is normal.  Trapezius and sternocleidomastoid strength is normal. No dysarthria is noted.  The tongue is midline, and the patient has symmetric elevation of the soft palate. No obvious hearing deficits are noted.  Motor:  Muscle bulk is normal.   Tone is increased in left leg. Strength is  5 / 5 in arms and right leg, 4-/5 left hip flexion and 4/5 elsewhere in leg.   Sensory: Sensory testing is intact to pinprick, soft touch and vibration sensation in the arms but she reports mild reduced pp in left leg.    Coordination: Cerebellar testing reveals good finger-nose-finger and heel-to-shin bilaterally (proportional to strength)  Gait and station: Station is normal.   She has a left foot drop and spastic gait.     Reflexes: Deep tendon reflexes are symmetric and normal bilaterally.   Plantar responses are flexor.    DIAGNOSTIC DATA (LABS, IMAGING, TESTING) - I reviewed patient records, labs, notes, testing and imaging myself where available.  Lab Results  Component Value Date   WBC 15.8 (H) 10/20/2019   HGB 12.1 10/20/2019   HCT 38.4 10/20/2019   MCV 96.0 10/20/2019   PLT 338 10/20/2019      Component Value Date/Time   NA 139 10/20/2019 2248   NA 142 01/10/2019 1530   K 4.0 10/20/2019 2248    CL 103 10/20/2019 2248   CO2 28 10/20/2019 2248   GLUCOSE 95 10/20/2019 2248   BUN 18 10/20/2019 2248   BUN 13 01/10/2019 1530   CREATININE 0.69 10/20/2019 2248   CREATININE 0.76 06/01/2013 1120   CALCIUM 9.3 10/20/2019 2248   PROT 7.0 10/20/2019 2248   PROT 7.8 12/28/2017 1614   ALBUMIN 3.6 10/20/2019 2248   ALBUMIN 4.3 12/28/2017 1614   AST 28 10/20/2019 2248   ALT 25 10/20/2019 2248   ALKPHOS 54 10/20/2019 2248   BILITOT 0.8 10/20/2019 2248   BILITOT 0.2 12/28/2017 1614   GFRNONAA >60 10/20/2019 2248   GFRNONAA >89 06/01/2013 1120   GFRAA >60 10/20/2019 2248   GFRAA >89 06/01/2013 1120   Lab Results  Component Value Date   CHOL 158 10/08/2015   HDL 41 10/08/2015   LDLCALC 101 (H) 10/08/2015   TRIG 78 10/08/2015   CHOLHDL 3.9 10/08/2015   No results found for: HGBA1C No results found for: VITAMINB12 Lab Results  Component Value Date   TSH 0.763 09/28/2019       ASSESSMENT AND PLAN  Neuromyelitis optica (devic) ([place]) - Plan: Hepatitis B surface antibody,qualitative, Hepatitis B core antibody, total  Transverse myelitis (HCC)  Optic neuritis, right  High risk medication use - Plan: Hepatitis B surface antibody,qualitative, Hepatitis B core antibody, total   In summary, Ms. Bushong is a 54 year old woman with anti-MOG neuromyelitis optica.  Her first event was  transverse myelitis April 2021 and she has since had optic neuritis July 2021.   While she is in the office we gave her 1 g of IV Solu-Medrol.  I will have her take 1000 mg prednisone for the next 4 days.  She will let us know if she does not note an improvement within a few days.  I discussed treatment options with her.  There is no FDA approved medication for anti-MOG neuromyelitis optica.  Due to the aggressiveness with 2 episodes within 3 months, we need to initiate her on an aggressive immunotherapy.  We will get her started on Rituxan 1000 mg x 2 separated by 2 weeks every 6 months.  She already has had  most of the required blood work and I will check some additional hepatitis labs.  I have requested the actual MRI images to review and will dictate an addendum if my interpretation differs from the official interpretation.  I will see her back for regular visit in about 3 months or sooner if there are new or worsening neurologic symptoms.  Thank you for asked me to see Ms. Stefano.  Please let me know if I can be of further assistance with her or other patients in the future.  Texanna Hilburn A. Felecia Shelling, MD, [facility] 3/40/3709, 6:43 PM Certified in Neurology, Clinical Neurophysiology, Sleep Medicine and Neuroimaging  [facility] [address] [place], [place] [address] [phone]

## 2020-01-11 LAB — HEPATITIS B CORE ANTIBODY, TOTAL: Hep B Core Total Ab: NEGATIVE

## 2020-01-11 LAB — HEPATITIS B SURFACE ANTIBODY,QUALITATIVE: Hep B Surface Ab, Qual: NONREACTIVE

## 2020-01-14 ENCOUNTER — Telehealth: Payer: Self-pay | Admitting: *Deleted

## 2020-01-14 NOTE — Telephone Encounter (Addendum)
Gave completed/signed Rituxan (truxima- Dr. Felecia Shelling states any Rituximab may be used) orders to intrafusion. Orders per Dr. Felecia Shelling include: Pre-meds: tylenol 650mg  po 30 min prior ot infusion, benadryl 25mg  IVP 55min prior to infusion, solumedrol 125mg  IVP 30 min prior to the infusion. RA dx: initial treatment: 1000mg  IV day 1 and day 15. Mix in 585ml NS for final concentration of 2mg /ml Ongoing treatment: 1000mg  IV day 1 and day 15 every 24 weeks (no sooner than 16 wks)x continuous. Mix in 544ml NS for final concentration of 2mg /ml. Post infusion: flush IV with NS, d/c IV, discharge home

## 2020-01-14 NOTE — Telephone Encounter (Signed)
R/c CD and report from [place]. CD and report on Emma desk

## 2020-01-25 ENCOUNTER — Telehealth: Payer: Self-pay | Admitting: Neurology

## 2020-01-25 NOTE — Telephone Encounter (Signed)
I personally reviewed the MRI of the brain, cervical spine and thoracic spine from 10/16/2019.  The MRI of the brain showed some nonspecific white matter foci in the subcortical or deep white matter but no acute findings.  The MRIs of the cervical spine showed a normal spinal cord.  The MRI of the thoracic spine showed an enhancing lesion to the left of midline at T10 to T11.

## 2020-02-12 ENCOUNTER — Ambulatory Visit: Payer: BC Managed Care – PPO | Admitting: Neurology

## 2020-03-06 ENCOUNTER — Encounter: Payer: BC Managed Care – PPO | Admitting: Physical Therapy

## 2020-04-05 ENCOUNTER — Other Ambulatory Visit: Payer: Self-pay | Admitting: Neurology

## 2020-04-14 ENCOUNTER — Other Ambulatory Visit: Payer: Self-pay | Admitting: Obstetrics and Gynecology

## 2020-04-14 DIAGNOSIS — Z1231 Encounter for screening mammogram for malignant neoplasm of breast: Secondary | ICD-10-CM

## 2020-05-26 ENCOUNTER — Ambulatory Visit: Payer: BC Managed Care – PPO

## 2020-06-12 ENCOUNTER — Ambulatory Visit
Admission: RE | Admit: 2020-06-12 | Discharge: 2020-06-12 | Disposition: A | Discharge status: Home / Self Care | Payer: BC Managed Care – PPO | Source: Ambulatory Visit | Attending: Obstetrics and Gynecology | Admitting: Obstetrics and Gynecology

## 2020-06-12 ENCOUNTER — Other Ambulatory Visit: Payer: Self-pay

## 2020-06-12 DIAGNOSIS — Z1231 Encounter for screening mammogram for malignant neoplasm of breast: Secondary | ICD-10-CM

## 2020-06-21 ENCOUNTER — Encounter (HOSPITAL_COMMUNITY): Payer: Self-pay | Admitting: Emergency Medicine

## 2020-06-21 ENCOUNTER — Ambulatory Visit (HOSPITAL_COMMUNITY)
Admission: EM | Admit: 2020-06-21 | Discharge: 2020-06-21 | Disposition: A | Discharge status: Home / Self Care | Payer: PRIVATE HEALTH INSURANCE | Attending: Physician Assistant | Admitting: Physician Assistant

## 2020-06-21 ENCOUNTER — Other Ambulatory Visit: Payer: Self-pay

## 2020-06-21 DIAGNOSIS — M25561 Pain in right knee: Secondary | ICD-10-CM | POA: Diagnosis not present

## 2020-06-21 MED ORDER — PREDNISONE 5 MG (48) PO TBPK: ORAL_TABLET | ORAL | 0 refills | Status: DC

## 2020-06-21 NOTE — ED Provider Notes (Signed)
[facility]    CSN: [id number] Arrival date & time: 06/21/20  1023      History   Chief Complaint Chief Complaint  Patient presents with  . Knee Pain    HPI Lauren [name] is a 55 y.o. female.   Who is currently being treated for transverse myelitis with chronic right leg weakness. She is on Rituxan treatments under the care of Dr. Leonie Man. She presents with right knee pain today. No injury is noted. Pain x several days. She reports swelling. Pain with weight bear. No prior history of right knee pain. No additional joint pain or swelling. No fevers or rashes.      Past Medical History:  Diagnosis Date  . Allergy    seasonal  . Boils 12/15/2010   multiple axilla  . Carpal tunnel syndrome of left wrist   . Chronic headaches 2008    improving  . Constipation   . Constipation 03/19/2015  . DDD (degenerative disc disease), cervical   . De Quervain's disease (tenosynovitis)    Right per pt  . Encounter for Papanicolaou smear for cervical cancer screening 12/08/2016  . Epigastric pain 09/21/2017  . Heart murmur    History of Heart murmur as child.  Marland Kitchen Heart palpitations   . Heartburn    Occ  . HLD (hyperlipidemia) 2008  . Hypertension   . Menorrhagia   . OA (osteoarthritis) of neck   . Palpable abdominal aorta 09/21/2017  . Right hip pain   . Right ovarian cyst 11/16/2009   2 small, noted on pelvic US  . Seizures ([place])    history of one time  . Shoulder pain, right 07/2008  . Ventral hernia   . Weight loss 12/30/2017    Patient Active Problem List   Diagnosis Date Noted  . Neuromyelitis optica (devic) ([place]) 01/10/2020  . High risk medication use 01/10/2020  . Transverse myelitis ([place]) 01/10/2020  . Optic neuritis, right 01/10/2020  . Renal cyst, acquired, right 10/24/2019  . Sleep deprivation 01/10/2019  . Recurrent abdominal hernia without obstruction or gangrene 09/21/2017  . De Quervain's tenosynovitis, left 08/10/2017  . Encounter for Papanicolaou  smear for cervical cancer screening 12/08/2016  . Seasonal allergies 12/08/2016  . Carpal tunnel syndrome 12/08/2016  . Cervical radiculopathy 01/19/2016  . SVT (supraventricular tachycardia) ([place]) 09/10/2015  . Colon cancer screening 03/19/2015  . Smoking 1/2 pack a day or less 03/14/2014  . Dyslipidemia 04/07/2007  . Essential hypertension 05/20/2006    Past Surgical History:  Procedure Laterality Date  . COLONOSCOPY W/ POLYPECTOMY  12/24/2015  . INSERTION OF MESH N/A 10/25/2017   Procedure: INSERTION OF MESH;  Surgeon: Kinsinger, Arta Bruce, MD;  [facility];  Service: General;  Laterality: N/A;  . VENTRAL HERNIA REPAIR N/A 10/25/2017   Procedure: LAPAROSCOPIC VENTRAL HERNIA;  Surgeon: Kieth Brightly Arta Bruce, MD;  Location: [facility];  Service: General;  Laterality: N/A;    OB History   No obstetric history on file.      Home Medications    Prior to Admission medications   Medication Sig Start Date End Date Taking? Authorizing Provider  predniSONE (STERAPRED UNI-PAK 48 TAB) 5 MG (48) TBPK tablet Take by mouth as directed. 5mg  12 day pack-take as directed with food 06/21/20  Yes Wellington Winegarden, Vanessa [place], PA-C  amLODipine (NORVASC) 10 MG tablet TAKE 1 TABLET(10 MG) BY MOUTH DAILY Patient taking differently: Take 10 mg by mouth daily. 1 tablet po TID after meals  03/14/19   Kathi Ludwig, MD  gabapentin (NEURONTIN) 300 MG capsule Take by mouth. 11/15/19   [provider]  lisinopril (ZESTRIL) 40 MG tablet TAKE 1 TABLET BY MOUTH EVERY DAY Patient taking differently: Take 40 mg by mouth daily.  06/12/19   Kathi Ludwig, MD  methylPREDNISolone (MEDROL) 4 MG tablet Take 4 mg by mouth daily.    [provider]  Multiple Vitamins-Minerals (CENTRUM SILVER 50+WOMEN PO) Take by mouth.    [provider]  pantoprazole (PROTONIX) 20 MG tablet Take 1 tablet (20 mg total) by mouth daily. 10/21/19   Muthersbaugh, Jarrett Soho, PA-C   pravastatin (PRAVACHOL) 40 MG tablet Take 1 tablet (40 mg total) by mouth every evening. 07/25/19   Kathi Ludwig, MD  Wheat Dextrin (BENEFIBER PO) Take by mouth.    [provider]    Family History Family History  Problem Relation Age of Onset  . Brain cancer Mother   . Heart failure Father   . Cirrhosis Brother        alcohol related  . Heart disease Maternal Grandmother   . Breast cancer Neg Hx   . Colon cancer Neg Hx   . Esophageal cancer Neg Hx   . Stomach cancer Neg Hx     Social History Social History   Tobacco Use  . Smoking status: Current Every Day Smoker    Packs/day: 0.30    Years: 36.00    Pack years: 10.80    Types: Cigarettes  . Smokeless tobacco: Never Used  . Tobacco comment: 1/2 pack per day  Vaping Use  . Vaping Use: Never used  Substance Use Topics  . Alcohol use: Yes    Alcohol/week: 0.0 standard drinks    Comment: seldom  . Drug use: Not Currently    Types: Marijuana    Comment: 2-3 times per week for pain     Allergies   Penicillins and Other   Review of Systems Review of Systems  Constitutional: Negative for fever.  Musculoskeletal: Positive for gait problem and joint swelling.  Skin: Negative for rash.     Physical Exam Triage Vital Signs ED Triage Vitals  Enc Vitals Group     BP 06/21/20 1049 130/81     Pulse Rate 06/21/20 1049 76     Resp 06/21/20 1049 16     Temp 06/21/20 1049 99 F (37.2 C)     Temp Source 06/21/20 1049 Oral     SpO2 06/21/20 1049 98 %     Weight --      Height --      Head Circumference --      Peak Flow --      Pain Score 06/21/20 1048 8     Pain Loc --      Pain Edu? --      Excl. in [place]? --    No data found.  Updated Vital Signs BP 130/81 (BP Location: Right Arm)   Pulse 76   Temp 99 F (37.2 C) (Oral)   Resp 16   LMP 08/09/2012   SpO2 98%   Visual Acuity Right Eye Distance:   Left Eye Distance:   Bilateral Distance:    Right Eye Near:   Left Eye Near:     Bilateral Near:     Physical Exam Vitals and nursing note reviewed.  Constitutional:      Appearance: Normal appearance.  HENT:     Head: Normocephalic and atraumatic.  Eyes:  General: No scleral icterus. Pulmonary:     Effort: Pulmonary effort is normal.  Musculoskeletal:        General: Normal range of motion.     Comments: Right knee is cool and without effusion, no frank joint swelling, possible anterior pes bursa swelling tender to location laterally  Skin:    General: Skin is warm and dry.     Findings: No rash.  Neurological:     General: No focal deficit present.     Mental Status: She is alert.  Psychiatric:        Mood and Affect: Mood normal.        Behavior: Behavior normal.      UC Treatments / Results  Labs (all labs ordered are listed, but only abnormal results are displayed) Labs Reviewed - No data to display  EKG   Radiology No results found.  Procedures Procedures (including critical care time)  Medications Ordered in UC Medications - No data to display  Initial Impression / Assessment and Plan / UC Course  I have reviewed the triage vital signs and the nursing notes.  Pertinent labs & imaging results that were available during my care of the patient were reviewed by me and considered in my medical decision making (see chart for details).   Though TM can be related to inflammatory arthritis her presentation does not reflect an inflammatory knee, however due to acute pain will treat with pred pack and she should keep regular f/u with her neurologist for further evaluation if continues.    Final Clinical Impressions(s) / UC Diagnoses   Final diagnoses:  Acute pain of right knee     Discharge Instructions     Suggest taking a prednisone pack. Hold your 4mg  of Medrol until complete pack then ok to resume as you were previously. Ice and rest. FU with your regular physicians for further management.    ED Prescriptions    Medication  Sig Dispense Auth. Provider   predniSONE (STERAPRED UNI-PAK 48 TAB) 5 MG (48) TBPK tablet Take by mouth as directed. 5mg  12 day pack-take as directed with food 48 tablet Bjorn Pippin, [place]     PDMP not reviewed this encounter.   Bjorn Pippin, PA-C 06/21/20 1109

## 2020-06-21 NOTE — Discharge Instructions (Addendum)
Suggest taking a prednisone pack. Hold your 4mg  of Medrol until complete pack then ok to resume as you were previously. Ice and rest. FU with your regular physicians for further management.

## 2020-06-21 NOTE — ED Triage Notes (Signed)
Pt has previous issues with knee and was suppose to be an infusion on the 28th she says the pain is harder to deal with. Pt said she cant bend nor put pressure.

## 2020-07-03 ENCOUNTER — Emergency Department (HOSPITAL_COMMUNITY)
Admission: EM | Admit: 2020-07-03 | Discharge: 2020-07-03 | Disposition: A | Discharge status: Home / Self Care | Payer: PRIVATE HEALTH INSURANCE | Attending: Emergency Medicine | Admitting: Emergency Medicine

## 2020-07-03 ENCOUNTER — Emergency Department (HOSPITAL_COMMUNITY): Payer: PRIVATE HEALTH INSURANCE

## 2020-07-03 ENCOUNTER — Encounter (HOSPITAL_COMMUNITY): Payer: Self-pay

## 2020-07-03 ENCOUNTER — Other Ambulatory Visit: Payer: Self-pay

## 2020-07-03 DIAGNOSIS — Z79899 Other long term (current) drug therapy: Secondary | ICD-10-CM | POA: Diagnosis not present

## 2020-07-03 DIAGNOSIS — F17210 Nicotine dependence, cigarettes, uncomplicated: Secondary | ICD-10-CM | POA: Diagnosis not present

## 2020-07-03 DIAGNOSIS — R0789 Other chest pain: Secondary | ICD-10-CM | POA: Insufficient documentation

## 2020-07-03 DIAGNOSIS — I10 Essential (primary) hypertension: Secondary | ICD-10-CM | POA: Diagnosis not present

## 2020-07-03 LAB — BASIC METABOLIC PANEL
Anion gap: 11 (ref 5–15)
BUN: 20 mg/dL (ref 6–20)
CO2: 27 mmol/L (ref 22–32)
Calcium: 9.8 mg/dL (ref 8.9–10.3)
Chloride: 99 mmol/L (ref 98–111)
Creatinine, Ser: 1.07 mg/dL — ABNORMAL HIGH (ref 0.44–1.00)
GFR, Estimated: >60 mL/min (ref >60)
Glucose, Bld: 127 mg/dL — ABNORMAL HIGH (ref 70–99)
Potassium: 4 mmol/L (ref 3.5–5.1)
Sodium: 137 mmol/L (ref 135–145)

## 2020-07-03 LAB — CBC
HCT: 40.8 % (ref 36.0–46.0)
Hemoglobin: 12.5 g/dL (ref 12.0–15.0)
MCH: 29.3 pg (ref 26.0–34.0)
MCHC: 30.6 g/dL (ref 30.0–36.0)
MCV: 95.6 fL (ref 80.0–100.0)
Platelets: 354 10*3/uL (ref 150–400)
RBC: 4.27 MIL/uL (ref 3.87–5.11)
RDW: 13.6 % (ref 11.5–15.5)
WBC: 19 10*3/uL — ABNORMAL HIGH (ref 4.0–10.5)
nRBC: 0 % (ref 0.0–0.2)

## 2020-07-03 LAB — I-STAT BETA HCG BLOOD, ED (MC, WL, AP ONLY): I-stat hCG, quantitative: <5 m[IU]/mL (ref <5)

## 2020-07-03 LAB — TROPONIN I (HIGH SENSITIVITY)
Troponin I (High Sensitivity): 15 ng/L (ref <18)
Troponin I (High Sensitivity): 6 ng/L (ref <18)

## 2020-07-03 NOTE — ED Notes (Signed)
Pt off the floor

## 2020-07-03 NOTE — ED Provider Notes (Signed)
[facility] EMERGENCY DEPARTMENT Provider Note   CSN: [id number] Arrival date & time: 07/03/20  5784     History Chief Complaint  Patient presents with  . Chest Pain    Lauren [name] is a 55 y.o. female  HPI Patient is a 55 year old female presenting today with chest pain that has been gradual in onset sternal nonradiating nonexertional nonpleuritic began at rest approximately noon yesterday.  She states that it is seem to get somewhat worse when she was working with her hands yesterday and today but states it does not seem to get worse when she is cardiovascularly exerting herself.  She denies any shortness of breath, nausea, vomiting, diaphoresis.  She has been a smoker for many years of her life but quit 7 months ago.  She has no first-degree relatives with history of MI.  She does have a history of HTN, HLD, recently diagnosed with transverse myelitis/neuromyelitis after COVID vaccination.  She does not follow a cardiologist she does see a primary care doctor through [facility].  No recent surgeries, hospitalization, long travel, hemoptysis, estrogen containing OCP, cancer history.  No unilateral leg swelling.  No history of PE or VTE.   HPI: A 55 year old patient with a history of hypertension and hypercholesterolemia presents for evaluation of chest pain. Initial onset of pain was more than 6 hours ago. The patient's chest pain is described as heaviness/pressure/tightness and is not worse with exertion. The patient's chest pain is middle- or left-sided, is not well-localized, is not sharp and does not radiate to the arms/jaw/neck. The patient does not complain of nausea and denies diaphoresis. The patient has no history of stroke, has no history of peripheral artery disease, has not smoked in the past 90 days, denies any history of treated diabetes, has no relevant family history of coronary artery disease (first degree relative at less than age 11) and does not  have an elevated BMI (>=30).   Past Medical History:  Diagnosis Date  . Allergy    seasonal  . Boils 12/15/2010   multiple axilla  . Carpal tunnel syndrome of left wrist   . Chronic headaches 2008    improving  . Constipation   . Constipation 03/19/2015  . DDD (degenerative disc disease), cervical   . De Quervain's disease (tenosynovitis)    Right per pt  . Encounter for Papanicolaou smear for cervical cancer screening 12/08/2016  . Epigastric pain 09/21/2017  . Heart murmur    History of Heart murmur as child.  Marland Kitchen Heart palpitations   . Heartburn    Occ  . HLD (hyperlipidemia) 2008  . Hypertension   . Menorrhagia   . OA (osteoarthritis) of neck   . Palpable abdominal aorta 09/21/2017  . Right hip pain   . Right ovarian cyst 11/16/2009   2 small, noted on pelvic US  . Seizures ([place])    history of one time  . Shoulder pain, right 07/2008  . Ventral hernia   . Weight loss 12/30/2017    Patient Active Problem List   Diagnosis Date Noted  . Neuromyelitis optica (devic) ([place]) 01/10/2020  . High risk medication use 01/10/2020  . Transverse myelitis ([place]) 01/10/2020  . Optic neuritis, right 01/10/2020  . Renal cyst, acquired, right 10/24/2019  . Sleep deprivation 01/10/2019  . Recurrent abdominal hernia without obstruction or gangrene 09/21/2017  . De Quervain's tenosynovitis, left 08/10/2017  . Encounter for Papanicolaou smear for cervical cancer screening 12/08/2016  . Seasonal allergies 12/08/2016  .  Carpal tunnel syndrome 12/08/2016  . Cervical radiculopathy 01/19/2016  . SVT (supraventricular tachycardia) ([place]) 09/10/2015  . Colon cancer screening 03/19/2015  . Smoking 1/2 pack a day or less 03/14/2014  . Dyslipidemia 04/07/2007  . Essential hypertension 05/20/2006    Past Surgical History:  Procedure Laterality Date  . COLONOSCOPY W/ POLYPECTOMY  12/24/2015  . INSERTION OF MESH N/A 10/25/2017   Procedure: INSERTION OF MESH;  Surgeon: Kinsinger, Arta Bruce, MD;   Location: [facility];  Service: General;  Laterality: N/A;  . VENTRAL HERNIA REPAIR N/A 10/25/2017   Procedure: LAPAROSCOPIC VENTRAL HERNIA;  Surgeon: Kieth Brightly Arta Bruce, MD;  Location: [facility];  Service: General;  Laterality: N/A;     OB History   No obstetric history on file.     Family History  Problem Relation Age of Onset  . Brain cancer Mother   . Heart failure Father   . Cirrhosis Brother        alcohol related  . Heart disease Maternal Grandmother   . Breast cancer Neg Hx   . Colon cancer Neg Hx   . Esophageal cancer Neg Hx   . Stomach cancer Neg Hx     Social History   Tobacco Use  . Smoking status: Current Every Day Smoker    Packs/day: 0.30    Years: 36.00    Pack years: 10.80    Types: Cigarettes  . Smokeless tobacco: Never Used  . Tobacco comment: 1/2 pack per day  Vaping Use  . Vaping Use: Never used  Substance Use Topics  . Alcohol use: Yes    Alcohol/week: 0.0 standard drinks    Comment: seldom  . Drug use: Not Currently    Types: Marijuana    Comment: 2-3 times per week for pain    Home Medications Prior to Admission medications   Medication Sig Start Date End Date Taking? Authorizing Provider  amLODipine (NORVASC) 10 MG tablet TAKE 1 TABLET(10 MG) BY MOUTH DAILY Patient taking differently: Take 10 mg by mouth daily. 1 tablet po TID after meals 03/14/19   Kathi Ludwig, MD  gabapentin (NEURONTIN) 300 MG capsule Take by mouth. 11/15/19   [provider]  lisinopril (ZESTRIL) 40 MG tablet TAKE 1 TABLET BY MOUTH EVERY DAY Patient taking differently: Take 40 mg by mouth daily.  06/12/19   Kathi Ludwig, MD  methylPREDNISolone (MEDROL) 4 MG tablet Take 4 mg by mouth daily.    [provider]  Multiple Vitamins-Minerals (CENTRUM SILVER 50+WOMEN PO) Take by mouth.    [provider]  pantoprazole (PROTONIX) 20 MG tablet Take 1 tablet (20 mg total) by mouth daily. 10/21/19    Muthersbaugh, Jarrett Soho, PA-C  pravastatin (PRAVACHOL) 40 MG tablet Take 1 tablet (40 mg total) by mouth every evening. 07/25/19   Kathi Ludwig, MD  predniSONE (STERAPRED UNI-PAK 48 TAB) 5 MG (48) TBPK tablet Take by mouth as directed. 5mg  12 day pack-take as directed with food 06/21/20   Bjorn Pippin, PA-C  Wheat Dextrin (BENEFIBER PO) Take by mouth.    [provider]    Allergies    Penicillins and Other  Review of Systems   Review of Systems  Constitutional: Negative for chills and fever.  HENT: Negative for congestion.   Eyes: Negative for pain.  Respiratory: Negative for cough and shortness of breath.   Cardiovascular: Positive for chest pain. Negative for leg swelling.  Gastrointestinal: Negative for abdominal pain, diarrhea, nausea and vomiting.  Genitourinary: Negative  for dysuria.  Musculoskeletal: Negative for myalgias.  Skin: Negative for rash.  Neurological: Negative for dizziness and headaches.    Physical Exam Updated Vital Signs BP 115/86 (BP Location: Right Arm)   Pulse 86   Temp 97.6 F (36.4 C) (Oral)   Resp 20   Ht 5\' 6"  (1.676 m)   Wt 67.1 kg   LMP 08/09/2012   SpO2 99%   BMI 23.89 kg/m   Physical Exam Vitals and nursing note reviewed.  Constitutional:      General: She is not in acute distress. HENT:     Head: Normocephalic and atraumatic.     Nose: Nose normal.     Mouth/Throat:     Mouth: Mucous membranes are moist.  Eyes:     General: No scleral icterus. Cardiovascular:     Rate and Rhythm: Normal rate and regular rhythm.     Pulses: Normal pulses.     Heart sounds: Normal heart sounds.     Comments: Bilateral 3+ radial pulses Pulmonary:     Effort: Pulmonary effort is normal. No respiratory distress.     Breath sounds: Normal breath sounds. No wheezing.  Abdominal:     Palpations: Abdomen is soft.     Tenderness: There is no abdominal tenderness. There is no right CVA tenderness, left CVA tenderness, guarding or  rebound.  Musculoskeletal:     Cervical back: Normal range of motion.     Right lower leg: No edema.     Left lower leg: No edema.     Comments: No lower extremity edema. No calf tenderness.  Skin:    General: Skin is warm and dry.     Capillary Refill: Capillary refill takes less than 2 seconds.  Neurological:     Mental Status: She is alert. Mental status is at baseline.  Psychiatric:        Mood and Affect: Mood normal.        Behavior: Behavior normal.     ED Results / Procedures / Treatments   Labs (all labs ordered are listed, but only abnormal results are displayed) Labs Reviewed  BASIC METABOLIC PANEL - Abnormal; Notable for the following components:      Result Value   Glucose, Bld 127 (*)    Creatinine, Ser 1.07 (*)    All other components within normal limits  CBC - Abnormal; Notable for the following components:   WBC 19.0 (*)    All other components within normal limits  I-STAT BETA HCG BLOOD, ED ([facility], [facility], [facility] ONLY)  TROPONIN I (HIGH SENSITIVITY)  TROPONIN I (HIGH SENSITIVITY)    EKG EKG Interpretation  Date/Time:  Thursday July 03 2020 08:18:54 EST Ventricular Rate:  90 PR Interval:  160 QRS Duration: 70 QT Interval:  342 QTC Calculation: 418 R Axis:   26 Text Interpretation: Normal sinus rhythm Normal ECG No significant change since last tracing Confirmed by Dorie Rank [phone]) on 07/03/2020 2:08:31 PM   Radiology DG Chest 2 View  Result Date: 07/03/2020 CLINICAL DATA:  Chest pain for 2 days. EXAM: CHEST - 2 VIEW COMPARISON:  06/11/2010 FINDINGS: The heart size and mediastinal contours are within normal limits. Both lungs are clear. The visualized skeletal structures are unremarkable. IMPRESSION: No active cardiopulmonary disease. Electronically Signed   By: Misty Stanley M.D.   On: 07/03/2020 08:32    Procedures Procedures (including critical care time)  Medications Ordered in ED Medications - No data to display  ED Course  I have  reviewed  the triage vital signs and the nursing notes.  Pertinent labs & imaging results that were available during my care of the patient were reviewed by me and considered in my medical decision making (see chart for details).    MDM Rules/Calculators/A&P HEAR Score: 3                         Patient is a 55 year old female with past medical history detailed above in HPI.  She is presented today with chest pain is been ongoing for approximately 30 hours at this time.  She has been in the ER for approximately 10 hours total has not had a second troponin done yet.  Initial troponin was 15-second troponin 6. On physical exam she is well-appearing she has bilateral radial artery pulses that are symmetric.  I am not able to reproduce her chest pain.  It is atypical in description and that it is not exertional not associated nausea vomiting diaphoresis or shortness of breath. She has no cardiac history.  She does have risk factors in the form of HTN and HLD.  Doubt PE as patient denies pleuritic component of CP, denies history of clot, active cancer, immobilization, surgery, hemoptysis, known clotting disorder, is on no estrogen containing medications, denies calf pain and on physical exam has no unilateral leg swelling, calf TTP, tachycardia or hypoxia.   Doubt ACS as discussed above  Doubt thoracic aortic dissection as pain lacks tearing or severe quality and does not radiate to back. Patient denies nausea, diaphoresis. Symmetric pulses, no murmur, no neurologic deficit. CXR without widened mediastinum.   Doubt pericarditis as no recent illness, PE without muffled heart sounds or friction rub, CP is non-pleuritic, EKG without global STE or PR depression.  EKG reviewed by Dr. Tomi Bamberger I agree with his reading.  I also discussed EKG findings and patient's story with my attending physician Karle Starch.  BMP unremarkable.  CBC with leukocytosis likely secondary to the prednisone use.  I states she is negative.   Troponin as discussed above.  Patient given very strict return precautions.  Will discharge with close follow-up with cardiology.  Final Clinical Impression(s) / ED Diagnoses Final diagnoses:  Atypical chest pain    Rx / DC Orders ED Discharge Orders         Ordered    Ambulatory referral to Cardiology        07/03/20 [place], [place], [place] 07/03/20 1856    Truddie Hidden, MD 07/03/20 2103

## 2020-07-03 NOTE — ED Triage Notes (Signed)
Pt reports cp x2 days. Pt reports chest pain located on the left side. Pt denies any sob , N&V.

## 2020-07-03 NOTE — Discharge Instructions (Signed)
Please follow up with a cardiologist I have provided you with information for the [facility] cardiology team however may follow-up with whoever your insurance covers.  I recommend drinking plenty of water he may take Tylenol 1000 mg every 6 hours as needed for pain.  May return to the ER for any new or concerning symptoms such as we discussed. As we discussed earlier, although your work-up is reassuring at this time and does not exclude the possibility of having a heart attack down the road.  You do have risk factors given your history of smoking hypertension and hyperlipidemia.  Please follow-up closely with your primary care doctor as well as the cardiology team.

## 2020-07-08 ENCOUNTER — Ambulatory Visit (INDEPENDENT_AMBULATORY_CARE_PROVIDER_SITE_OTHER): Payer: BC Managed Care – PPO | Admitting: Interventional Cardiology

## 2020-07-08 ENCOUNTER — Other Ambulatory Visit: Payer: Self-pay

## 2020-07-08 ENCOUNTER — Encounter: Payer: Self-pay | Admitting: Interventional Cardiology

## 2020-07-08 VITALS — BP 110/70 | HR 89 | Ht 66.0 in | Wt 157.6 lb

## 2020-07-08 DIAGNOSIS — E782 Mixed hyperlipidemia: Secondary | ICD-10-CM

## 2020-07-08 DIAGNOSIS — I10 Essential (primary) hypertension: Secondary | ICD-10-CM | POA: Diagnosis not present

## 2020-07-08 DIAGNOSIS — R072 Precordial pain: Secondary | ICD-10-CM | POA: Diagnosis not present

## 2020-07-08 DIAGNOSIS — Z8249 Family history of ischemic heart disease and other diseases of the circulatory system: Secondary | ICD-10-CM

## 2020-07-08 MED ORDER — METOPROLOL TARTRATE 100 MG PO TABS: ORAL_TABLET | ORAL | 0 refills | Status: DC

## 2020-07-08 NOTE — Progress Notes (Signed)
Cardiology Office Note    Date:  07/08/2020   ID:  Lauren [name], Lauren [name] 1965/06/23, MRN [id number]  PCP:  Fanny Bien, MD    No chief complaint on file.  chest pain  Wt Readings from Last 3 Encounters:  07/08/20 157 lb 9.6 oz (71.5 kg)  07/03/20 148 lb (67.1 kg)  01/10/20 149 lb 8 oz (67.8 kg)       History of Present Illness: Lauren [name] is a 55 y.o. female who is being seen today for the evaluation of chest pain at the request of Tedd Sias, PA.  [facility] had a 30 minutes episode on Jan 8 of chest pain.  She has neuro issues and feels that it was related to the combination of gabapentin and steroids.  Records show: " 54 year old female presenting today with chest pain that has been gradual in onset sternal nonradiating nonexertional nonpleuritic began at rest approximately noon yesterday.  She states that it is seem to get somewhat worse when she was working with her hands yesterday and today but states it does not seem to get worse when she is cardiovascularly exerting herself.  02/2019 Echo showed normal LV and valvular function.   Eval at [facility] in 04/2019 showed: "The Zio patch reading was notable for 50 runs of nonsustained SVT, the fastest of which was approximately 18 beats with a heart rate of 169 bpm. It should be noted, though, that the patient never triggered her monitor during any of these 50 episodes of nonsustained SVT. She only reported symptoms occasionally with some SV ectopy, isolated or couplets, as well as during periods of normal sinus rhythm. It should be noted though that the patient states that the symptoms she recorded while wearing the ZIO Patch were the same ones that prompted her to go to the [facility]. The echocardiogram was notable for an LVEF of 60 to 65%, with normal left ventricular function and size. There was, however, impaired relaxation pattern of LV diastolic filling noted."  She denies any shortness of breath,  nausea, vomiting, diaphoresis.  She has been a smoker for many years of her life but quit 7 months ago.  She has no first-degree relatives with history of MI.  She does have a history of HTN, HLD, recently diagnosed with transverse myelitis/neuromyelitis after COVID vaccination."  Negative w/u for MI.    She takes meds for high blood pressure and high cholesterol.  She stopped the steroids and gabapentin and feels better.  Her knee is swollen while off of the steroids.  She works at [employer] and is very active on the job.   Past Medical History:  Diagnosis Date  . Allergy    seasonal  . Boils 12/15/2010   multiple axilla  . Carpal tunnel syndrome of left wrist   . Chronic headaches 2008    improving  . Constipation   . Constipation 03/19/2015  . DDD (degenerative disc disease), cervical   . De Quervain's disease (tenosynovitis)    Right per pt  . Encounter for Papanicolaou smear for cervical cancer screening 12/08/2016  . Epigastric pain 09/21/2017  . Heart murmur    History of Heart murmur as child.  Marland Kitchen Heart palpitations   . Heartburn    Occ  . HLD (hyperlipidemia) 2008  . Hypertension   . Menorrhagia   . OA (osteoarthritis) of neck   . Palpable abdominal aorta 09/21/2017  . Right hip pain   .  Right ovarian cyst 11/16/2009   2 small, noted on pelvic US  . Seizures ([place])    history of one time  . Shoulder pain, right 07/2008  . Ventral hernia   . Weight loss 12/30/2017    Past Surgical History:  Procedure Laterality Date  . COLONOSCOPY W/ POLYPECTOMY  12/24/2015  . INSERTION OF MESH N/A 10/25/2017   Procedure: INSERTION OF MESH;  Surgeon: Kinsinger, Arta Bruce, MD;  Location: [facility];  Service: General;  Laterality: N/A;  . VENTRAL HERNIA REPAIR N/A 10/25/2017   Procedure: LAPAROSCOPIC VENTRAL HERNIA;  Surgeon: Kieth Brightly Arta Bruce, MD;  Location: [facility];  Service: General;  Laterality: N/A;     Current Outpatient Medications   Medication Sig Dispense Refill  . amLODipine (NORVASC) 10 MG tablet TAKE 1 TABLET(10 MG) BY MOUTH DAILY 90 tablet 3  . lisinopril (ZESTRIL) 40 MG tablet TAKE 1 TABLET BY MOUTH EVERY DAY 90 tablet 3  . Multiple Vitamins-Minerals (CENTRUM SILVER 50+WOMEN PO) Take by mouth.    . rosuvastatin (CRESTOR) 5 MG tablet Take 5 mg by mouth at bedtime.    . Wheat Dextrin (BENEFIBER PO) Take by mouth.     No current facility-administered medications for this visit.    Allergies:   Penicillins and Other    Social History:  The patient  reports that she has been smoking cigarettes. She has a 10.80 pack-year smoking history. She has never used smokeless tobacco. She reports current alcohol use. She reports previous drug use. Drug: Marijuana.   Family History:  The patient's family history includes Brain cancer in her mother; Cirrhosis in her brother; Heart disease in her maternal grandmother; Heart failure in her father.    ROS:  Please see the history of present illness.   Otherwise, review of systems are positive for knee swelling.   All other systems are reviewed and negative.    PHYSICAL EXAM: VS:  BP 110/70   Pulse 89   Ht 5\' 6"  (1.676 m)   Wt 157 lb 9.6 oz (71.5 kg)   LMP 08/09/2012   SpO2 98%   BMI 25.44 kg/m  , BMI Body mass index is 25.44 kg/m. GEN: Well nourished, well developed, in no acute distress  HEENT: normal  Neck: no JVD, carotid bruits, or masses Cardiac: RRR; no murmurs, rubs, or gallops,no edema  Respiratory:  clear to auscultation bilaterally, normal work of breathing GI: soft, nontender, nondistended, + BS MS: no deformity or atrophy  Skin: warm and dry, no rash Neuro:  Strength and sensation are intact Psych: euthymic mood, full affect   EKG:   The ekg ordered Jan 20 demonstrates NSR, no ST changes   Recent Labs: 09/28/2019: TSH 0.763 10/20/2019: ALT 25 07/03/2020: BUN 20; Creatinine, Ser 1.07; Hemoglobin 12.5; Platelets 354; Potassium 4.0; Sodium 137   Lipid  Panel    Component Value Date/Time   CHOL 158 10/08/2015 1512   TRIG 78 10/08/2015 1512   HDL 41 10/08/2015 1512   CHOLHDL 3.9 10/08/2015 1512   CHOLHDL 4.3 06/01/2013 1120   VLDL 25 06/01/2013 1120   LDLCALC 101 (H) 10/08/2015 1512     Other studies Reviewed: Additional studies/ records that were reviewed today with results demonstrating: ER records reviewed.   ASSESSMENT AND PLAN:  1. Chest pain: One episode that prompted her to go to the ER. She has had other eipsodes in the past that occur with palpitations. Prior rhythm eval a year ago- done at an outside  facility. Metoprolol 100 mg x 1 prior to the scan. 2. Tobacco abuse: Needs to abstain from smoking. 3. Family h/o CAD: maternal GM with MI at age 102.   63. HTN: The current medical regimen is effective;  continue present plan and medications.  Hold lisinopril before the cardiac CT.  We will give a dose of metoprolol.  5. Hyperlipidemia: LDL 109.  Continue rosuvastatin.  If she does have significant coronary artery disease, will have to increase intensity of statin.   Current medicines are reviewed at length with the patient today.  The patient concerns regarding her medicines were addressed.  The following changes have been made:  No change  Labs/ tests ordered today include:  No orders of the defined types were placed in this encounter.   Recommend 150 minutes/week of aerobic exercise Low fat, low carb, high fiber diet recommended  Disposition:   FU for CT scan   Signed, Larae Grooms, MD  07/08/2020 4:24 PM    [facility] [place], [place], [place]  [address] Phone: [phone]; Fax: [phone]

## 2020-07-08 NOTE — H&P (View-Only) (Signed)
Cardiology Office Note    Date:  07/08/2020   ID:  [name], [name] 1965/06/23, MRN [id number]  PCP:  Fanny Bien, MD    No chief complaint on file.  chest pain  Wt Readings from Last 3 Encounters:  07/08/20 157 lb 9.6 oz (71.5 kg)  07/03/20 148 lb (67.1 kg)  01/10/20 149 lb 8 oz (67.8 kg)       History of Present Illness: Lauren [name] is a 55 y.o. female who is being seen today for the evaluation of chest pain at the request of Tedd Sias, PA.  [facility] had a 30 minutes episode on Jan 8 of chest pain.  She has neuro issues and feels that it was related to the combination of gabapentin and steroids.  Records show: " 54 year old female presenting today with chest pain that has been gradual in onset sternal nonradiating nonexertional nonpleuritic began at rest approximately noon yesterday.  She states that it is seem to get somewhat worse when she was working with her hands yesterday and today but states it does not seem to get worse when she is cardiovascularly exerting herself.  02/2019 Echo showed normal LV and valvular function.   Eval at [facility] in 04/2019 showed: "The Zio patch reading was notable for 50 runs of nonsustained SVT, the fastest of which was approximately 18 beats with a heart rate of 169 bpm. It should be noted, though, that the patient never triggered her monitor during any of these 50 episodes of nonsustained SVT. She only reported symptoms occasionally with some SV ectopy, isolated or couplets, as well as during periods of normal sinus rhythm. It should be noted though that the patient states that the symptoms she recorded while wearing the ZIO Patch were the same ones that prompted her to go to the [facility]. The echocardiogram was notable for an LVEF of 60 to 65%, with normal left ventricular function and size. There was, however, impaired relaxation pattern of LV diastolic filling noted."  She denies any shortness of breath,  nausea, vomiting, diaphoresis.  She has been a smoker for many years of her life but quit 7 months ago.  She has no first-degree relatives with history of MI.  She does have a history of HTN, HLD, recently diagnosed with transverse myelitis/neuromyelitis after COVID vaccination."  Negative w/u for MI.    She takes meds for high blood pressure and high cholesterol.  She stopped the steroids and gabapentin and feels better.  Her knee is swollen while off of the steroids.  She works at [employer] and is very active on the job.   Past Medical History:  Diagnosis Date  . Allergy    seasonal  . Boils 12/15/2010   multiple axilla  . Carpal tunnel syndrome of left wrist   . Chronic headaches 2008    improving  . Constipation   . Constipation 03/19/2015  . DDD (degenerative disc disease), cervical   . De Quervain's disease (tenosynovitis)    Right per pt  . Encounter for Papanicolaou smear for cervical cancer screening 12/08/2016  . Epigastric pain 09/21/2017  . Heart murmur    History of Heart murmur as child.  Marland Kitchen Heart palpitations   . Heartburn    Occ  . HLD (hyperlipidemia) 2008  . Hypertension   . Menorrhagia   . OA (osteoarthritis) of neck   . Palpable abdominal aorta 09/21/2017  . Right hip pain   .  Right ovarian cyst 11/16/2009   2 small, noted on pelvic US  . Seizures ([place])    history of one time  . Shoulder pain, right 07/2008  . Ventral hernia   . Weight loss 12/30/2017    Past Surgical History:  Procedure Laterality Date  . COLONOSCOPY W/ POLYPECTOMY  12/24/2015  . INSERTION OF MESH N/A 10/25/2017   Procedure: INSERTION OF MESH;  Surgeon: Kinsinger, Arta Bruce, MD;  Location: [facility];  Service: General;  Laterality: N/A;  . VENTRAL HERNIA REPAIR N/A 10/25/2017   Procedure: LAPAROSCOPIC VENTRAL HERNIA;  Surgeon: Kieth Brightly Arta Bruce, MD;  Location: [facility];  Service: General;  Laterality: N/A;     Current Outpatient Medications   Medication Sig Dispense Refill  . amLODipine (NORVASC) 10 MG tablet TAKE 1 TABLET(10 MG) BY MOUTH DAILY 90 tablet 3  . lisinopril (ZESTRIL) 40 MG tablet TAKE 1 TABLET BY MOUTH EVERY DAY 90 tablet 3  . Multiple Vitamins-Minerals (CENTRUM SILVER 50+WOMEN PO) Take by mouth.    . rosuvastatin (CRESTOR) 5 MG tablet Take 5 mg by mouth at bedtime.    . Wheat Dextrin (BENEFIBER PO) Take by mouth.     No current facility-administered medications for this visit.    Allergies:   Penicillins and Other    Social History:  The patient  reports that she has been smoking cigarettes. She has a 10.80 pack-year smoking history. She has never used smokeless tobacco. She reports current alcohol use. She reports previous drug use. Drug: Marijuana.   Family History:  The patient's family history includes Brain cancer in her mother; Cirrhosis in her brother; Heart disease in her maternal grandmother; Heart failure in her father.    ROS:  Please see the history of present illness.   Otherwise, review of systems are positive for knee swelling.   All other systems are reviewed and negative.    PHYSICAL EXAM: VS:  BP 110/70   Pulse 89   Ht 5\' 6"  (1.676 m)   Wt 157 lb 9.6 oz (71.5 kg)   LMP 08/09/2012   SpO2 98%   BMI 25.44 kg/m  , BMI Body mass index is 25.44 kg/m. GEN: Well nourished, well developed, in no acute distress  HEENT: normal  Neck: no JVD, carotid bruits, or masses Cardiac: RRR; no murmurs, rubs, or gallops,no edema  Respiratory:  clear to auscultation bilaterally, normal work of breathing GI: soft, nontender, nondistended, + BS MS: no deformity or atrophy  Skin: warm and dry, no rash Neuro:  Strength and sensation are intact Psych: euthymic mood, full affect   EKG:   The ekg ordered Jan 20 demonstrates NSR, no ST changes   Recent Labs: 09/28/2019: TSH 0.763 10/20/2019: ALT 25 07/03/2020: BUN 20; Creatinine, Ser 1.07; Hemoglobin 12.5; Platelets 354; Potassium 4.0; Sodium 137   Lipid  Panel    Component Value Date/Time   CHOL 158 10/08/2015 1512   TRIG 78 10/08/2015 1512   HDL 41 10/08/2015 1512   CHOLHDL 3.9 10/08/2015 1512   CHOLHDL 4.3 06/01/2013 1120   VLDL 25 06/01/2013 1120   LDLCALC 101 (H) 10/08/2015 1512     Other studies Reviewed: Additional studies/ records that were reviewed today with results demonstrating: ER records reviewed.   ASSESSMENT AND PLAN:  1. Chest pain: One episode that prompted her to go to the ER. She has had other eipsodes in the past that occur with palpitations. Prior rhythm eval a year ago- done at an outside  facility. Metoprolol 100 mg x 1 prior to the scan. 2. Tobacco abuse: Needs to abstain from smoking. 3. Family h/o CAD: maternal GM with MI at age 44.   28. HTN: The current medical regimen is effective;  continue present plan and medications.  Hold lisinopril before the cardiac CT.  We will give a dose of metoprolol.  5. Hyperlipidemia: LDL 109.  Continue rosuvastatin.  If she does have significant coronary artery disease, will have to increase intensity of statin.   Current medicines are reviewed at length with the patient today.  The patient concerns regarding her medicines were addressed.  The following changes have been made:  No change  Labs/ tests ordered today include:  No orders of the defined types were placed in this encounter.   Recommend 150 minutes/week of aerobic exercise Low fat, low carb, high fiber diet recommended  Disposition:   FU for CT scan   Signed, Larae Grooms, MD  07/08/2020 4:24 PM    [facility] [address], [place], [place]  [address] Phone: [phone]; Fax: [phone]

## 2020-07-08 NOTE — Patient Instructions (Signed)
Medication Instructions:  NONE *If you need a refill on your cardiac medications before your next appointment, please call your pharmacy*   Lab Work: NONE If you have labs (blood work) drawn today and your tests are completely normal, you will receive your results only by: Marland Kitchen MyChart Message (if you have MyChart) OR . A paper copy in the mail If you have any lab test that is abnormal or we need to change your treatment, we will call you to review the results.   Testing/Procedures: Your physician has requested that you have cardiac CT.      Follow-Up: Based on results from Cardiac CT At [facility], you and your health needs are our priority.  As part of our continuing mission to provide you with exceptional heart care, we have created designated Provider Care Teams.  These Care Teams include your primary Cardiologist (physician) and Advanced Practice Providers (APPs -  Physician Assistants and Nurse Practitioners) who all work together to provide you with the care you need, when you need it.  We recommend signing up for the patient portal called "MyChart".  Sign up information is provided on this After Visit Summary.  MyChart is used to connect with patients for Virtual Visits (Telemedicine).  Patients are able to view lab/test results, encounter notes, upcoming appointments, etc.  Non-urgent messages can be sent to your provider as well.   To learn more about what you can do with MyChart, go to [email or web address].      Other Instructions Your cardiac CT will be scheduled at one of the below locations:   [facility] [address] [place], [place] [address] [phone]  [place] [address] [place], [place] [address] [phone]  If scheduled at [facility], please arrive at the [facility] main entrance of [facility] 30 minutes prior to test start time. Proceed to the [facility] Radiology Department (first floor) to check-in and test prep.  If scheduled at [facility], please arrive 15 mins early for check-in and test prep.  Please follow these instructions carefully (unless otherwise directed):    On the Night Before the Test: . Be sure to Drink plenty of water. . Do not consume any caffeinated/decaffeinated beverages or chocolate 12 hours prior to your test. . Do not take any antihistamines 12 hours prior to your test.  On the Day of the Test: . Drink plenty of water. Do not drink any water within one hour of the test. . Do not eat any food 4 hours prior to the test. . You may take your regular medications prior to the test.  . Take metoprolol (Lopressor) 170m  two hours prior to test. . Do not take lisinopril on day of Cardiac CT. .Marland KitchenFEMALES- please wear underwire-free bra if available        After the Test: . Drink plenty of water. . After receiving IV contrast, you may experience a mild flushed feeling. This is normal. . On occasion, you may experience a mild rash up to 24 hours after the test. This is not dangerous. If this occurs, you can take Benadryl 25 mg and increase your fluid intake. . If you experience trouble breathing, this can be serious. If it is severe call 911 IMMEDIATELY. If it is mild, please call our office.  Once we have confirmed authorization from your insurance company, we will call you to set up a date and  time for your test. Based on how quickly your insurance processes prior authorizations requests, please allow up to 4 weeks to be contacted for scheduling your Cardiac CT appointment. Be advised that routine Cardiac CT appointments could be scheduled as many as 8 weeks after your provider has ordered it.  For non-scheduling related questions, please contact the cardiac imaging nurse navigator should you have any questions/concerns: Marchia Bond, Cardiac Imaging Nurse Navigator Burley Saver, Interim  Cardiac Imaging Nurse [facility] Direct Office Dial: [phone]   For scheduling needs, including cancellations and rescheduling, please call [place], [phone].

## 2020-07-15 ENCOUNTER — Telehealth: Payer: Self-pay | Admitting: Neurology

## 2020-07-15 NOTE — Telephone Encounter (Signed)
Per Herbert Spires, pt requesting appt after 3pm

## 2020-07-15 NOTE — Telephone Encounter (Signed)
PT needs an appt with Dr. Felecia Shelling before her next infusion on 2/28.

## 2020-07-15 NOTE — Telephone Encounter (Signed)
Called pt. Scheduled appt for 07/28/20 at 3:30pm with Dr. Felecia Shelling. Asked she check in by 3:15/3:30pm. She verbalized understanding and appreciation.

## 2020-07-22 ENCOUNTER — Telehealth (HOSPITAL_COMMUNITY): Payer: Self-pay | Admitting: *Deleted

## 2020-07-22 NOTE — Telephone Encounter (Signed)
Reaching out to patient to offer assistance regarding upcoming cardiac imaging study; pt verbalizes understanding of appt date/time, parking situation and where to check in, pre-test NPO status and medications ordered, and verified current allergies; name and call back number provided for further questions should they arise  Rolla Servidio RN Navigator Cardiac Imaging [facility] [phone] office [phone] cell  

## 2020-07-23 ENCOUNTER — Ambulatory Visit (HOSPITAL_COMMUNITY)
Admission: RE | Admit: 2020-07-23 | Discharge: 2020-07-23 | Disposition: A | Discharge status: Home / Self Care | Payer: BC Managed Care – PPO | Source: Ambulatory Visit | Attending: Interventional Cardiology | Admitting: Interventional Cardiology

## 2020-07-23 ENCOUNTER — Other Ambulatory Visit: Payer: Self-pay

## 2020-07-23 DIAGNOSIS — Z006 Encounter for examination for normal comparison and control in clinical research program: Secondary | ICD-10-CM

## 2020-07-23 DIAGNOSIS — R072 Precordial pain: Secondary | ICD-10-CM | POA: Diagnosis present

## 2020-07-23 MED ORDER — IOHEXOL 350 MG/ML SOLN
80.0000 mL | Freq: Once | INTRAVENOUS | Status: AC | PRN
  Administered 2020-07-23: 80 mL via INTRAVENOUS

## 2020-07-23 MED ORDER — DILTIAZEM HCL 25 MG/5ML IV SOLN
5.0000 mg | Freq: Once | INTRAVENOUS | Status: AC
  Filled 2020-07-23: qty 5

## 2020-07-23 MED ORDER — METOPROLOL TARTRATE 5 MG/5ML IV SOLN
INTRAVENOUS | Status: AC
  Filled 2020-07-23: qty 5

## 2020-07-23 MED ORDER — NITROGLYCERIN 0.4 MG SL SUBL
SUBLINGUAL_TABLET | SUBLINGUAL | Status: AC
  Filled 2020-07-23: qty 2

## 2020-07-23 MED ORDER — METOPROLOL TARTRATE 5 MG/5ML IV SOLN
5.0000 mg | INTRAVENOUS | Status: DC | PRN
  Administered 2020-07-23: 5 mg via INTRAVENOUS

## 2020-07-23 MED ORDER — DILTIAZEM HCL 25 MG/5ML IV SOLN
10.0000 mg | Freq: Once | INTRAVENOUS | Status: AC
  Administered 2020-07-23: 10 mg via INTRAVENOUS
  Filled 2020-07-23: qty 5

## 2020-07-23 MED ORDER — DILTIAZEM HCL 25 MG/5ML IV SOLN
INTRAVENOUS | Status: AC
  Administered 2020-07-23: 5 mg via INTRAVENOUS
  Filled 2020-07-23: qty 5

## 2020-07-23 MED ORDER — METOPROLOL TARTRATE 5 MG/5ML IV SOLN
INTRAVENOUS | Status: AC
  Administered 2020-07-23: 5 mg via INTRAVENOUS
  Filled 2020-07-23: qty 5

## 2020-07-23 MED ORDER — NITROGLYCERIN 0.4 MG SL SUBL
0.8000 mg | SUBLINGUAL_TABLET | Freq: Once | SUBLINGUAL | Status: AC
  Administered 2020-07-23: 0.8 mg via SUBLINGUAL

## 2020-07-23 NOTE — Research (Signed)
IDENTIFY Informed Consent                  Subject Name: Lauren [name]    Subject met inclusion and exclusion criteria.  The informed consent form, study requirements and expectations were reviewed with the subject and questions and concerns were addressed prior to the signing of the consent form.  The subject verbalized understanding of the trial requirements.  The subject agreed to participate in the IDENTIFY trial and signed the informed consent at 13:30 on 07/23/20.  The informed consent was obtained prior to performance of any protocol-specific procedures for the subject.  A copy of the signed informed consent was given to the subject and a copy was placed in the subject's medical record.   Meade Maw, Naval architect

## 2020-07-24 ENCOUNTER — Ambulatory Visit (HOSPITAL_COMMUNITY)
Admission: RE | Admit: 2020-07-24 | Discharge: 2020-07-24 | Disposition: A | Discharge status: Home / Self Care | Payer: BC Managed Care – PPO | Source: Ambulatory Visit | Attending: Interventional Cardiology | Admitting: Interventional Cardiology

## 2020-07-24 DIAGNOSIS — R072 Precordial pain: Secondary | ICD-10-CM | POA: Diagnosis not present

## 2020-07-28 ENCOUNTER — Ambulatory Visit (INDEPENDENT_AMBULATORY_CARE_PROVIDER_SITE_OTHER): Payer: PRIVATE HEALTH INSURANCE | Admitting: Neurology

## 2020-07-28 ENCOUNTER — Encounter: Payer: Self-pay | Admitting: Neurology

## 2020-07-28 VITALS — BP 98/58 | HR 93 | Ht 66.0 in | Wt 156.5 lb

## 2020-07-28 DIAGNOSIS — Z8669 Personal history of other diseases of the nervous system and sense organs: Secondary | ICD-10-CM

## 2020-07-28 DIAGNOSIS — G360 Neuromyelitis optica [Devic]: Secondary | ICD-10-CM

## 2020-07-28 DIAGNOSIS — H539 Unspecified visual disturbance: Secondary | ICD-10-CM | POA: Diagnosis not present

## 2020-07-28 DIAGNOSIS — G373 Acute transverse myelitis in demyelinating disease of central nervous system: Secondary | ICD-10-CM

## 2020-07-28 DIAGNOSIS — Z79899 Other long term (current) drug therapy: Secondary | ICD-10-CM | POA: Diagnosis not present

## 2020-07-28 NOTE — Progress Notes (Addendum)
[facility]  PATIENT: Lauren [name] DOB: [birth year]  REFERRING DOCTOR OR PCP:  Rachell Cipro SOURCE:   _________________________________   HISTORICAL  CHIEF COMPLAINT:  Chief Complaint  Patient presents with  . Follow-up    RM 12, alone Last seen 01/10/20. Neuromyelitis optica. On Rituxan, next infusion: 08/11/20. Right knee started swelling about a month ago. Other neurologist prescribed oral prednisone for this and it helped.    HISTORY OF PRESENT ILLNESS:  Lauren [name], at the [place] center at [facility] for neurologic consultation regarding her transverse myelitis and recent diagnosis of anti-MOG positive neuromyelitis optica.  07/28/2020: She has ben on Rituximab and he rnext infusion is 08/11/2020.    She notes a swelled up sensation in her right leg.    However, her gait is doing well.   She has no falls.    She denies any new weakness.   She denies any change in bladder functions.  She denies any numbness now.  She does note some knee pain on the right.  She denies change in vision now reporting symmetric acuity and color.  After the last visit we did several days of IV Solu-medrol and vision and numbness improved.    Currently, she can walk a couple miles without stopping.  She is working and walks all day on the job.     She saw ophtometry Engineer, building services on [place]).  Dr. Marin Comment shortly after the initial visit with me last year.  NMOSD History: She had transverse myelitis 2 days after receiving the first [place] vaccination 09/18/2019.   On 09/20/2019, her left leg went numb.  Over the next 2 days, the right leg started to get numb and the left leg numbness worsen and she became weaker.   She was able to walk though she felt she had a limp.  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  She did not have any numbness or weakness in the arms.  Because symptoms persisted, she went to Urgent care 09/28/19 and had  standard labs that were fine.  She was prescribed 20 mg prednisone x 5 days and advised to call back if not better.  Symptoms persisted and she was referred to Dr. Manuella Ghazi..  He saw her 10/15/19 and then sent her that day to [facility] due to Brown-Sequard like symptoms.   She was seen in the emergency room and then admitted.  She was treated with IV Solumedrol for 5 days.   She had a lumbar puncture and her CSF showed oligoclonal bands (9).  Additionally, she had testing for neuromyelitis optica and is anti-MOG positive.  She is anti-NMO negative.   The anti-Mog antibody test had not returned yet when she was discharged.  She followed up with Dr. Manuella Ghazi and is referred for possible DMT therapy.    She received 5 days IV Solumedrol with improvement.   She had the onset of right eye pain about a week ago, worse with movements and over the next couple days she lost vision in both eyes, right worse than left, consistent with optic neuritis..  At the visit, 01/10/2020, she had perception of shapes but could not count fingers in the majority of the visual field and was able to count fingers in the upper nasal quadrant.  On the left, she had mildly reduced vision but could read.  She received 4 days of IV Solu-Medrol and vision improved over the next couple weeks and has  continued to improve towards baseline.  Imaging: MRI of the cervical and thoracic spine 10/16/2019 showed an enhancing lesion to the left of midline from T10 to T11.  The cervical spine was normal.  MRI of the brain showed some nonspecific white matter foci in the subcortical and deep white matter but no acute findings.  Laboratory test: 01/10/2020: Hepatitis B core and surface antibodies were negative 10/16/2019: CSF showed 9 oligoclonal bands unique to the CSF. 10/17/2019: Anti-NMO was negative and anti-MOG was positive 1:100 ([facility]).  ESR and ANA were negative.  CCP IgG/IgAwas negative.  Quant TB negative   REVIEW OF SYSTEMS: Constitutional: No fevers,  chills, sweats, or change in appetite Eyes: No visual changes, double vision, eye pain Ear, nose and throat: No hearing loss, ear pain, nasal congestion, sore throat Cardiovascular: No chest pain, palpitations Respiratory: No shortness of breath at rest or with exertion.   No wheezes GastrointestinaI: No nausea, vomiting, diarrhea, abdominal pain, fecal incontinence Genitourinary: No dysuria, urinary retention or frequency.  No nocturia. Musculoskeletal: No neck pain, back pain Integumentary: No rash, pruritus, skin lesions Neurological: as above Psychiatric: No depression at this time.  No anxiety Endocrine: No palpitations, diaphoresis, change in appetite, change in weigh or increased thirst Hematologic/Lymphatic: No anemia, purpura, petechiae. Allergic/Immunologic: No itchy/runny eyes, nasal congestion, recent allergic reactions, rashes  ALLERGIES: Allergies  Allergen Reactions  . Penicillins Anaphylaxis, Hives and Swelling  . Other Other (See Comments) and Swelling    Banana - Swelling and hives. Banana - Swelling and hives.    HOME MEDICATIONS:  Current Outpatient Medications:  .  amLODipine (NORVASC) 10 MG tablet, TAKE 1 TABLET(10 MG) BY MOUTH DAILY, Disp: 90 tablet, Rfl: 3 .  lisinopril (ZESTRIL) 40 MG tablet, TAKE 1 TABLET BY MOUTH EVERY DAY, Disp: 90 tablet, Rfl: 3 .  Multiple Vitamins-Minerals (CENTRUM SILVER 50+WOMEN PO), Take by mouth., Disp: , Rfl:  .  rosuvastatin (CRESTOR) 5 MG tablet, Take 5 mg by mouth at bedtime., Disp: , Rfl:  .  Wheat Dextrin (BENEFIBER PO), Take by mouth., Disp: , Rfl:   PAST MEDICAL HISTORY: Past Medical History:  Diagnosis Date  . Allergy    seasonal  . Boils 12/15/2010   multiple axilla  . Carpal tunnel syndrome of left wrist   . Chronic headaches 2008    improving  . Constipation   . Constipation 03/19/2015  . DDD (degenerative disc disease), cervical   . De Quervain's disease (tenosynovitis)    Right per pt  . Encounter for  Papanicolaou smear for cervical cancer screening 12/08/2016  . Epigastric pain 09/21/2017  . Heart murmur    History of Heart murmur as child.  Marland Kitchen Heart palpitations   . Heartburn    Occ  . HLD (hyperlipidemia) 2008  . Hypertension   . Menorrhagia   . OA (osteoarthritis) of neck   . Palpable abdominal aorta 09/21/2017  . Right hip pain   . Right ovarian cyst 11/16/2009   2 small, noted on pelvic US  . Seizures ([place])    history of one time  . Shoulder pain, right 07/2008  . Ventral hernia   . Weight loss 12/30/2017    PAST SURGICAL HISTORY: Past Surgical History:  Procedure Laterality Date  . COLONOSCOPY W/ POLYPECTOMY  12/24/2015  . INSERTION OF MESH N/A 10/25/2017   Procedure: INSERTION OF MESH;  Surgeon: Kinsinger, Arta Bruce, MD;  Location: [facility];  Service: General;  Laterality: N/A;  . VENTRAL HERNIA REPAIR N/A  10/25/2017   Procedure: LAPAROSCOPIC VENTRAL HERNIA;  Surgeon: Kieth Brightly Arta Bruce, MD;  Location: [facility];  Service: General;  Laterality: N/A;    FAMILY HISTORY: Family History  Problem Relation Age of Onset  . Brain cancer Mother   . Heart failure Father   . Cirrhosis Brother        alcohol related  . Heart disease Maternal Grandmother   . Breast cancer Neg Hx   . Colon cancer Neg Hx   . Esophageal cancer Neg Hx   . Stomach cancer Neg Hx     SOCIAL HISTORY:  Social History   Socioeconomic History  . Marital status: Soil scientist    Spouse name: Not on file  . Number of children: Not on file  . Years of education: Not on file  . Highest education level: Not on file  Occupational History  . Not on file  Tobacco Use  . Smoking status: Current Every Day Smoker    Packs/day: 0.30    Years: 36.00    Pack years: 10.80    Types: Cigarettes  . Smokeless tobacco: Never Used  . Tobacco comment: 1/2 pack per day  Vaping Use  . Vaping Use: Never used  Substance and Sexual Activity  . Alcohol use: Yes     Alcohol/week: 0.0 standard drinks    Comment: seldom  . Drug use: Not Currently    Types: Marijuana    Comment: 2-3 times per week for pain  . Sexual activity: Yes    Birth control/protection: Post-menopausal  Other Topics Concern  . Not on file  Social History Narrative   Works as Investment banker, corporate at [employer], lives with significant other in [place]   No EToh, former beer drinker   No illicit drug use.   Current smoker, 1/2 PPDx 26 years   Caffeine use: none   Right handed    Social Determinants of Health   Financial Resource Strain: Not on file  Food Insecurity: Not on file  Transportation Needs: Not on file  Physical Activity: Not on file  Stress: Not on file  Social Connections: Not on file  Intimate Partner Violence: Not on file     PHYSICAL EXAM  Vitals:   07/28/20 1524  BP: (!) 98/58  Pulse: 93  SpO2: 98%  Weight: 156 lb 8 oz (71 kg)  Height: 5' 6"  (1.676 m)    Body mass index is 25.26 kg/m.  VA 20/30 OD VA 20/50 OS  General: The patient is well-developed and well-nourished and in no acute distress  HEENT:  Head is [place]/AT.  Sclera are anicteric.  Funduscopic exam shows normal optic discs and retinal vessels.       Neck: No carotid bruits are noted.  The neck is nontender.  Cardiovascular: The heart has a regular rate and rhythm with a normal S1 and S2. There were no murmurs, gallops or rubs.    Skin: Extremities are without rash or  edema.  Musculoskeletal:  Back is nontender  Neurologic Exam  Mental status: The patient is alert and oriented x 3 at the time of the examination. The patient has apparent normal recent and remote memory, with an apparently normal attention span and concentration ability.   Speech is normal.  Cranial nerves: Extraocular movements are full.  She has mild ptosis, right greater than left.  Color vision was symmetric.   Visual fields now FTC.  Facial symmetry is present. There is good facial sensation  to soft  touch bilaterally. Facial strength is normal.  Trapezius and sternocleidomastoid strength is normal. No dysarthria is noted.  The tongue is midline, and the patient has symmetric elevation of the soft palate. No obvious hearing deficits are noted.  Motor:  Muscle bulk is normal.   Tone is Normal.  Strength is 5/5 now.    Sensory: Sensory testing is intact to pinprick, soft touch and vibration sensation in the arms and legs Coordination: Cerebellar testing reveals good finger-nose-finger and heel-to-shin bilaterally (proportional to strength)  Gait and station: Station is normal.   She has normal gait and mildly wide tandem gait..     Reflexes: Deep tendon reflexes are symmetric and normal bilaterally.   Plantar responses are flexor.    DIAGNOSTIC DATA (LABS, IMAGING, TESTING) - I reviewed patient records, labs, notes, testing and imaging myself where available.  Lab Results  Component Value Date   WBC 19.0 (H) 07/03/2020   HGB 12.5 07/03/2020   HCT 40.8 07/03/2020   MCV 95.6 07/03/2020   PLT 354 07/03/2020      Component Value Date/Time   NA 137 07/03/2020 0829   NA 142 01/10/2019 1530   K 4.0 07/03/2020 0829   CL 99 07/03/2020 0829   CO2 27 07/03/2020 0829   GLUCOSE 127 (H) 07/03/2020 0829   BUN 20 07/03/2020 0829   BUN 13 01/10/2019 1530   CREATININE 1.07 (H) 07/03/2020 0829   CREATININE 0.76 06/01/2013 1120   CALCIUM 9.8 07/03/2020 0829   PROT 7.0 10/20/2019 2248   PROT 7.8 12/28/2017 1614   ALBUMIN 3.6 10/20/2019 2248   ALBUMIN 4.3 12/28/2017 1614   AST 28 10/20/2019 2248   ALT 25 10/20/2019 2248   ALKPHOS 54 10/20/2019 2248   BILITOT 0.8 10/20/2019 2248   BILITOT 0.2 12/28/2017 1614   GFRNONAA >60 07/03/2020 0829   GFRNONAA >89 06/01/2013 1120   GFRAA >60 10/20/2019 2248   GFRAA >89 06/01/2013 1120   Lab Results  Component Value Date   CHOL 158 10/08/2015   HDL 41 10/08/2015   LDLCALC 101 (H) 10/08/2015   TRIG 78 10/08/2015   CHOLHDL 3.9 10/08/2015   No  results found for: HGBA1C No results found for: VITAMINB12 Lab Results  Component Value Date   TSH 0.763 09/28/2019       ASSESSMENT AND PLAN  Neuromyelitis optica (devic) (HCC) - Plan: IgG, IgA, IgM, CBC with Differential/Platelet, Pan-ANCA, CANCELED: ANA w/Reflex  Visual disturbance - Plan: Pan-ANCA, CANCELED: ANA w/Reflex  High risk medication use - Plan: IgG, IgA, IgM, CBC with Differential/Platelet  History of optic neuritis - Plan: Pan-ANCA, CANCELED: ANA w/Reflex  Transverse myelitis ([place]) - Plan: Pan-ANCA, CANCELED: ANA w/Reflex   1.  She has recovered well from her transverse myelitis and optic neuritis last year.   She appears to have anti-MOG NMOSD.  Interestingly, she does have oligoclonal bands.  This was seen in about 13% of adults (Jarius 2020), usually doing a relapse, in 1 study of anti-MOG NMOSD.  She has done very well on rituximab and should continue.    2.  Check IgG/IgM, CBC with differential 3.   Return in 6 months.  Later this year we will check an MRI of the brain to determine if there has been any subclinical progression.  Addendum:   Plasmapheresis is not indicated.  It has shown benefit for the acute treatment of a relapse but has not been well studied as a chronic treatment for NMOSD and even more poorly  studies for anti-MOG NMOSD.     Alvis Edgell A. Felecia Shelling, MD, [place] 11/[birth year], 9:97 PM Certified in Neurology, Clinical Neurophysiology, Sleep Medicine and Neuroimaging  [facility] [address], [place], [place] [address] [phone]

## 2020-07-29 ENCOUNTER — Other Ambulatory Visit: Payer: Self-pay | Admitting: *Deleted

## 2020-07-29 ENCOUNTER — Telehealth: Payer: Self-pay | Admitting: *Deleted

## 2020-07-29 ENCOUNTER — Other Ambulatory Visit (INDEPENDENT_AMBULATORY_CARE_PROVIDER_SITE_OTHER): Payer: Self-pay

## 2020-07-29 ENCOUNTER — Other Ambulatory Visit: Payer: Self-pay | Admitting: Neurology

## 2020-07-29 DIAGNOSIS — Z0289 Encounter for other administrative examinations: Secondary | ICD-10-CM

## 2020-07-29 DIAGNOSIS — Z8669 Personal history of other diseases of the nervous system and sense organs: Secondary | ICD-10-CM

## 2020-07-29 DIAGNOSIS — Z79899 Other long term (current) drug therapy: Secondary | ICD-10-CM

## 2020-07-29 DIAGNOSIS — D649 Anemia, unspecified: Secondary | ICD-10-CM

## 2020-07-29 DIAGNOSIS — G360 Neuromyelitis optica [Devic]: Secondary | ICD-10-CM

## 2020-07-29 DIAGNOSIS — H539 Unspecified visual disturbance: Secondary | ICD-10-CM

## 2020-07-29 DIAGNOSIS — H469 Unspecified optic neuritis: Secondary | ICD-10-CM

## 2020-07-29 DIAGNOSIS — G373 Acute transverse myelitis in demyelinating disease of central nervous system: Secondary | ICD-10-CM

## 2020-07-29 LAB — CBC WITH DIFFERENTIAL/PLATELET
Basophils Absolute: 0.1 10*3/uL (ref 0.0–0.2)
Basos: 1 %
EOS (ABSOLUTE): 0.3 10*3/uL (ref 0.0–0.4)
Eos: 2 %
Hematocrit: 29.9 % — ABNORMAL LOW (ref 34.0–46.6)
Hemoglobin: 9.8 g/dL — ABNORMAL LOW (ref 11.1–15.9)
Immature Grans (Abs): 0.1 10*3/uL (ref 0.0–0.1)
Immature Granulocytes: 1 %
Lymphocytes Absolute: 1.9 10*3/uL (ref 0.7–3.1)
Lymphs: 15 %
MCH: 28.7 pg (ref 26.6–33.0)
MCHC: 32.8 g/dL (ref 31.5–35.7)
MCV: 87 fL (ref 79–97)
Monocytes Absolute: 1.3 10*3/uL — ABNORMAL HIGH (ref 0.1–0.9)
Monocytes: 10 %
Neutrophils Absolute: 8.9 10*3/uL — ABNORMAL HIGH (ref 1.4–7.0)
Neutrophils: 71 %
Platelets: 461 10*3/uL — ABNORMAL HIGH (ref 150–450)
RBC: 3.42 x10E6/uL — ABNORMAL LOW (ref 3.77–5.28)
RDW: 12.3 % (ref 11.7–15.4)
WBC: 12.5 10*3/uL — ABNORMAL HIGH (ref 3.4–10.8)

## 2020-07-29 LAB — PAN-ANCA
ANCA Proteinase 3: <3.5 U/mL (ref 0.0–3.5)
Atypical pANCA: <1:20 {titer}
C-ANCA: <1:20 {titer}
Myeloperoxidase Ab: <9 U/mL (ref 0.0–9.0)
P-ANCA: <1:20 {titer}

## 2020-07-29 LAB — IGG, IGA, IGM
IgA/Immunoglobulin A, Serum: 447 mg/dL — ABNORMAL HIGH (ref 87–352)
IgG (Immunoglobin G), Serum: 1061 mg/dL (ref 586–1602)
IgM (Immunoglobulin M), Srm: 165 mg/dL (ref 26–217)

## 2020-07-29 LAB — ANA W/REFLEX: Anti Nuclear Antibody (ANA): NEGATIVE

## 2020-07-29 NOTE — Telephone Encounter (Signed)
Called and spoke with pt. Relayed results per Dr. Felecia Shelling note. Pt reports she wascalled yesterday about having a blockage in her heart. They are wanting to set her up for this Thursday for surgery. she is waiting on call today for what time. Advised I will speak with MD and call her back to let her know if he still wants her to come for further labwork.

## 2020-07-29 NOTE — Telephone Encounter (Signed)
Cath instructions sent to patient through my chart.  I spoke with patient and reviewed instructions with her.  Patient will call Dr [facility] office to get information regarding appointment on Thursday

## 2020-07-29 NOTE — Telephone Encounter (Signed)
Called pt back. Per Dr. Felecia Shelling: "yes she should come in today --- the cardiologist actually messaged me because the HgB dropped from 12.5 to 9.8". Pt will come now for further lab work. Placed on schedule for 10:30am, she has about 20 min drive.

## 2020-07-29 NOTE — Telephone Encounter (Signed)
-----   Message from Britt Bottom, MD sent at 07/29/2020  9:11 AM EST ----- Lab work showed that the hemoglobin dropped over the last 3 weeks. I would like her to get some additional lab work today. I put the orders in and she can drop by the office

## 2020-07-29 NOTE — Telephone Encounter (Signed)
-----   Message from Lauren Booze, MD sent at 07/28/2020  6:06 PM EST ----- I spoke to the patient today about cath for Thursday morning.  Please schedule for abnormal CTA and angina.   THanks.  JV

## 2020-07-29 NOTE — Progress Notes (Signed)
Has reduction of HgB from 12.5 to 9.8 in 3 weeks.   Will check labs.

## 2020-07-29 NOTE — Telephone Encounter (Signed)
Lab work done by Dr Renne Musca yesterday shows recent drop in hemoglobin.  CBC, BMP and additional lab work to be done by Dr Elizebeth Brooking office today.  Based on recent lab results Dr Irish Lack would like to move cath to 08/06/20.  Patient also to have stool hemoccult checked by PCP. I spoke with patient and updated her.  Patient denies any signs of bleeding. Cath changed to 08/06/20 at 10:00.  Covid testing scheduled for 08/04/20 at 9:40. I spoke with Dr [facility] office and they will check hemoccult on 2/17.

## 2020-07-30 ENCOUNTER — Telehealth: Payer: Self-pay | Admitting: Interventional Cardiology

## 2020-07-30 LAB — CBC
Hematocrit: 32.6 % — ABNORMAL LOW (ref 34.0–46.6)
Hemoglobin: 10.3 g/dL — ABNORMAL LOW (ref 11.1–15.9)
MCH: 28.8 pg (ref 26.6–33.0)
MCHC: 31.6 g/dL (ref 31.5–35.7)
MCV: 91 fL (ref 79–97)
Platelets: 477 10*3/uL — ABNORMAL HIGH (ref 150–450)
RBC: 3.58 x10E6/uL — ABNORMAL LOW (ref 3.77–5.28)
RDW: 12 % (ref 11.7–15.4)
WBC: 13.9 10*3/uL — ABNORMAL HIGH (ref 3.4–10.8)

## 2020-07-30 LAB — FOLATE: Folate: 13.2 ng/mL (ref >3.0)

## 2020-07-30 LAB — VITAMIN B12: Vitamin B-12: >2000 pg/mL — ABNORMAL HIGH (ref 232–1245)

## 2020-07-30 LAB — LACTATE DEHYDROGENASE: LDH: 199 IU/L (ref 119–226)

## 2020-07-30 LAB — FERRITIN: Ferritin: 415 ng/mL — ABNORMAL HIGH (ref 15–150)

## 2020-07-30 LAB — HAPTOGLOBIN: Haptoglobin: 400 mg/dL — ABNORMAL HIGH (ref 33–346)

## 2020-07-30 LAB — RETICULOCYTES: Retic Ct Pct: 0.8 % (ref 0.6–2.6)

## 2020-07-30 NOTE — Telephone Encounter (Signed)
[facility] received a disability benefits form from [place]. We will need a Release of Information form signed & $29 to complete this form.

## 2020-07-31 LAB — IFOBT (OCCULT BLOOD): IFOBT: NEGATIVE

## 2020-08-01 DIAGNOSIS — Z0279 Encounter for issue of other medical certificate: Secondary | ICD-10-CM

## 2020-08-01 LAB — BASIC METABOLIC PANEL
BUN/Creatinine Ratio: 14 (ref 9–23)
BUN: 10 mg/dL (ref 6–24)
CO2: 19 mmol/L — ABNORMAL LOW (ref 20–29)
Calcium: 9.1 mg/dL (ref 8.7–10.2)
Chloride: 98 mmol/L (ref 96–106)
Creatinine, Ser: 0.73 mg/dL (ref 0.57–1.00)
GFR calc Af Amer: 108 mL/min/{1.73_m2} (ref >59)
GFR calc non Af Amer: 94 mL/min/{1.73_m2} (ref >59)
Glucose: 129 mg/dL — ABNORMAL HIGH (ref 65–99)
Potassium: 4.5 mmol/L (ref 3.5–5.2)
Sodium: 140 mmol/L (ref 134–144)

## 2020-08-01 LAB — SPECIMEN STATUS REPORT

## 2020-08-04 ENCOUNTER — Other Ambulatory Visit (HOSPITAL_COMMUNITY)
Admission: RE | Admit: 2020-08-04 | Discharge: 2020-08-04 | Disposition: A | Discharge status: Home / Self Care | Payer: BC Managed Care – PPO | Source: Ambulatory Visit | Attending: Interventional Cardiology | Admitting: Interventional Cardiology

## 2020-08-04 DIAGNOSIS — Z01812 Encounter for preprocedural laboratory examination: Secondary | ICD-10-CM | POA: Insufficient documentation

## 2020-08-04 DIAGNOSIS — Z880 Allergy status to penicillin: Secondary | ICD-10-CM | POA: Diagnosis not present

## 2020-08-04 DIAGNOSIS — R079 Chest pain, unspecified: Secondary | ICD-10-CM | POA: Diagnosis not present

## 2020-08-04 DIAGNOSIS — Z20822 Contact with and (suspected) exposure to covid-19: Secondary | ICD-10-CM | POA: Insufficient documentation

## 2020-08-04 DIAGNOSIS — I10 Essential (primary) hypertension: Secondary | ICD-10-CM | POA: Diagnosis not present

## 2020-08-04 DIAGNOSIS — F17210 Nicotine dependence, cigarettes, uncomplicated: Secondary | ICD-10-CM | POA: Diagnosis not present

## 2020-08-04 DIAGNOSIS — I2510 Atherosclerotic heart disease of native coronary artery without angina pectoris: Secondary | ICD-10-CM | POA: Diagnosis not present

## 2020-08-04 DIAGNOSIS — Z79899 Other long term (current) drug therapy: Secondary | ICD-10-CM | POA: Diagnosis not present

## 2020-08-04 DIAGNOSIS — E785 Hyperlipidemia, unspecified: Secondary | ICD-10-CM | POA: Diagnosis not present

## 2020-08-04 DIAGNOSIS — Z8249 Family history of ischemic heart disease and other diseases of the circulatory system: Secondary | ICD-10-CM | POA: Diagnosis not present

## 2020-08-04 LAB — SARS CORONAVIRUS 2 (TAT 6-24 HRS): SARS Coronavirus 2: NEGATIVE

## 2020-08-04 NOTE — Telephone Encounter (Signed)
Patient completed Lauren [name] and paid the $29 for form on 08/01/20. Took form to Dr. Hassell Done box for completion [place] 08/04/20

## 2020-08-05 ENCOUNTER — Telehealth: Payer: Self-pay | Admitting: *Deleted

## 2020-08-05 NOTE — Telephone Encounter (Signed)
Pt contacted pre-catheterization scheduled at [facility] for: Wednesday August 06, 2020 10 AM Verified arrival time and place: [facility]) at: 8 AM   No solid food after midnight prior to cath, clear liquids until 5 AM day of procedure.  AM meds can be  taken pre-cath with sips of water including: ASA 81 mg   Confirmed patient has responsible adult to drive home post procedure and be with patient first 24 hours after arriving home: yes  You are allowed ONE visitor in the waiting room during the time you are at the hospital for your procedure. Both you and your visitor must wear a mask once you enter the hospital.   Reviewed procedure/mask/visitor instructions with patient.

## 2020-08-06 ENCOUNTER — Ambulatory Visit (HOSPITAL_COMMUNITY)
Admission: RE | Admit: 2020-08-06 | Discharge: 2020-08-06 | Disposition: A | Discharge status: Home / Self Care | Payer: BC Managed Care – PPO | Attending: Interventional Cardiology | Admitting: Interventional Cardiology

## 2020-08-06 ENCOUNTER — Encounter (HOSPITAL_COMMUNITY)
Admission: RE | Disposition: A | Discharge status: Home / Self Care | Payer: Self-pay | Source: Home / Self Care | Attending: Interventional Cardiology

## 2020-08-06 ENCOUNTER — Encounter (HOSPITAL_COMMUNITY): Payer: Self-pay | Admitting: Interventional Cardiology

## 2020-08-06 DIAGNOSIS — R931 Abnormal findings on diagnostic imaging of heart and coronary circulation: Secondary | ICD-10-CM

## 2020-08-06 DIAGNOSIS — I2510 Atherosclerotic heart disease of native coronary artery without angina pectoris: Secondary | ICD-10-CM | POA: Diagnosis not present

## 2020-08-06 DIAGNOSIS — R079 Chest pain, unspecified: Secondary | ICD-10-CM | POA: Insufficient documentation

## 2020-08-06 DIAGNOSIS — I10 Essential (primary) hypertension: Secondary | ICD-10-CM | POA: Diagnosis not present

## 2020-08-06 DIAGNOSIS — Z79899 Other long term (current) drug therapy: Secondary | ICD-10-CM | POA: Insufficient documentation

## 2020-08-06 DIAGNOSIS — Z880 Allergy status to penicillin: Secondary | ICD-10-CM | POA: Insufficient documentation

## 2020-08-06 DIAGNOSIS — F17210 Nicotine dependence, cigarettes, uncomplicated: Secondary | ICD-10-CM | POA: Insufficient documentation

## 2020-08-06 DIAGNOSIS — Z8249 Family history of ischemic heart disease and other diseases of the circulatory system: Secondary | ICD-10-CM | POA: Insufficient documentation

## 2020-08-06 DIAGNOSIS — Z20822 Contact with and (suspected) exposure to covid-19: Secondary | ICD-10-CM | POA: Insufficient documentation

## 2020-08-06 DIAGNOSIS — E785 Hyperlipidemia, unspecified: Secondary | ICD-10-CM | POA: Insufficient documentation

## 2020-08-06 HISTORY — PX: LEFT HEART CATH AND CORONARY ANGIOGRAPHY: CATH118249

## 2020-08-06 SURGERY — LEFT HEART CATH AND CORONARY ANGIOGRAPHY
Anesthesia: LOCAL

## 2020-08-06 MED ORDER — HEPARIN (PORCINE) IN NACL 1000-0.9 UT/500ML-% IV SOLN
INTRAVENOUS | Status: DC | PRN
  Administered 2020-08-06 (×2): 500 mL

## 2020-08-06 MED ORDER — SODIUM CHLORIDE 0.9% FLUSH: 3.0000 mL | Freq: Two times a day (BID) | INTRAVENOUS | Status: DC

## 2020-08-06 MED ORDER — SODIUM CHLORIDE 0.9 % IV SOLN: 250.0000 mL | INTRAVENOUS | Status: DC | PRN

## 2020-08-06 MED ORDER — HEPARIN SODIUM (PORCINE) 1000 UNIT/ML IJ SOLN
INTRAMUSCULAR | Status: DC | PRN
  Administered 2020-08-06: 3500 [IU] via INTRAVENOUS

## 2020-08-06 MED ORDER — SODIUM CHLORIDE 0.9% FLUSH: 3.0000 mL | INTRAVENOUS | Status: DC | PRN

## 2020-08-06 MED ORDER — IOHEXOL 350 MG/ML SOLN
INTRAVENOUS | Status: DC | PRN
  Administered 2020-08-06: 50 mL

## 2020-08-06 MED ORDER — LIDOCAINE HCL (PF) 1 % IJ SOLN
INTRAMUSCULAR | Status: DC | PRN
  Administered 2020-08-06: 2 mL

## 2020-08-06 MED ORDER — ONDANSETRON HCL 4 MG/2ML IJ SOLN: 4.0000 mg | Freq: Four times a day (QID) | INTRAMUSCULAR | Status: DC | PRN

## 2020-08-06 MED ORDER — HEPARIN (PORCINE) IN NACL 1000-0.9 UT/500ML-% IV SOLN
INTRAVENOUS | Status: AC
  Filled 2020-08-06: qty 1000

## 2020-08-06 MED ORDER — SODIUM CHLORIDE 0.9 % IV SOLN: INTRAVENOUS | Status: AC

## 2020-08-06 MED ORDER — FENTANYL CITRATE (PF) 100 MCG/2ML IJ SOLN
INTRAMUSCULAR | Status: DC | PRN
  Administered 2020-08-06: 25 ug via INTRAVENOUS

## 2020-08-06 MED ORDER — SODIUM CHLORIDE 0.9 % WEIGHT BASED INFUSION: 1.0000 mL/kg/h | INTRAVENOUS | Status: DC

## 2020-08-06 MED ORDER — SODIUM CHLORIDE 0.9 % WEIGHT BASED INFUSION
3.0000 mL/kg/h | INTRAVENOUS | Status: AC
  Administered 2020-08-06: 3 mL/kg/h via INTRAVENOUS

## 2020-08-06 MED ORDER — MIDAZOLAM HCL 2 MG/2ML IJ SOLN
INTRAMUSCULAR | Status: AC
  Filled 2020-08-06: qty 2

## 2020-08-06 MED ORDER — VERAPAMIL HCL 2.5 MG/ML IV SOLN
INTRAVENOUS | Status: AC
  Filled 2020-08-06: qty 2

## 2020-08-06 MED ORDER — HEPARIN SODIUM (PORCINE) 1000 UNIT/ML IJ SOLN
INTRAMUSCULAR | Status: AC
  Filled 2020-08-06: qty 1

## 2020-08-06 MED ORDER — LABETALOL HCL 5 MG/ML IV SOLN: 10.0000 mg | INTRAVENOUS | Status: DC | PRN

## 2020-08-06 MED ORDER — MIDAZOLAM HCL 2 MG/2ML IJ SOLN
INTRAMUSCULAR | Status: DC | PRN
  Administered 2020-08-06: 1 mg via INTRAVENOUS

## 2020-08-06 MED ORDER — ACETAMINOPHEN 325 MG PO TABS: 650.0000 mg | ORAL_TABLET | ORAL | Status: DC | PRN

## 2020-08-06 MED ORDER — VERAPAMIL HCL 2.5 MG/ML IV SOLN
INTRAVENOUS | Status: DC | PRN
  Administered 2020-08-06: 10 mL via INTRA_ARTERIAL

## 2020-08-06 MED ORDER — HYDRALAZINE HCL 20 MG/ML IJ SOLN: 10.0000 mg | INTRAMUSCULAR | Status: DC | PRN

## 2020-08-06 MED ORDER — LIDOCAINE HCL (PF) 1 % IJ SOLN
INTRAMUSCULAR | Status: AC
  Filled 2020-08-06: qty 30

## 2020-08-06 MED ORDER — ASPIRIN 81 MG PO CHEW: 81.0000 mg | CHEWABLE_TABLET | ORAL | Status: DC

## 2020-08-06 MED ORDER — FENTANYL CITRATE (PF) 100 MCG/2ML IJ SOLN
INTRAMUSCULAR | Status: AC
  Filled 2020-08-06: qty 2

## 2020-08-06 SURGICAL SUPPLY — 11 items
BAG SNAP BAND KOVER 36X36 (MISCELLANEOUS) ×2 IMPLANT
CATH 5FR JL3.5 JR4 ANG PIG MP (CATHETERS) ×2 IMPLANT
COVER DOME SNAP 22 D (MISCELLANEOUS) ×2 IMPLANT
DEVICE RAD COMP TR BAND LRG (VASCULAR PRODUCTS) ×2 IMPLANT
GLIDESHEATH SLEND SS 6F .021 (SHEATH) ×2 IMPLANT
GUIDEWIRE INQWIRE 1.5J.035X260 (WIRE) ×1 IMPLANT
INQWIRE 1.5J .035X260CM (WIRE) ×2
KIT HEART LEFT (KITS) ×2 IMPLANT
PACK CARDIAC CATHETERIZATION (CUSTOM PROCEDURE TRAY) ×2 IMPLANT
TRANSDUCER W/STOPCOCK (MISCELLANEOUS) ×2 IMPLANT
TUBING CIL FLEX 10 FLL-RA (TUBING) ×2 IMPLANT

## 2020-08-06 NOTE — Discharge Instructions (Signed)
Radial Site Care  This sheet gives you information about how to care for yourself after your procedure. Your health care provider may also give you more specific instructions. If you have problems or questions, contact your health care provider. What can I expect after the procedure? After the procedure, it is common to have:  Bruising and tenderness at the catheter insertion area. Follow these instructions at home: Medicines  Take over-the-counter and prescription medicines only as told by your health care provider. Insertion site care  Follow instructions from your health care provider about how to take care of your insertion site. Make sure you: ? Wash your hands with soap and water before you change your bandage (dressing). If soap and water are not available, use hand sanitizer. ? Change your dressing as told by your health care provider. ? Leave stitches (sutures), skin glue, or adhesive strips in place. These skin closures may need to stay in place for 2 weeks or longer. If adhesive strip edges start to loosen and curl up, you may trim the loose edges. Do not remove adhesive strips completely unless your health care provider tells you to do that.  Check your insertion site every day for signs of infection. Check for: ? Redness, swelling, or pain. ? Fluid or blood. ? Pus or a bad smell. ? Warmth.  Do not take baths, swim, or use a hot tub until your health care provider approves.  You may shower 24-48 hours after the procedure, or as directed by your health care provider. ? Remove the dressing and gently wash the site with plain soap and water. ? Pat the area dry with a clean towel. ? Do not rub the site. That could cause bleeding.  Do not apply powder or lotion to the site. Activity  For 24 hours after the procedure, or as directed by your health care provider: ? Do not flex or bend the affected arm. ? Do not push or pull heavy objects with the affected arm. ? Do not drive  yourself home from the hospital or clinic. You may drive 24 hours after the procedure unless your health care provider tells you not to. ? Do not operate machinery or power tools.  Do not lift anything that is heavier than 10 lb (4.5 kg), or the limit that you are told, until your health care provider says that it is safe.  Ask your health care provider when it is okay to: ? Return to work or school. ? Resume usual physical activities or sports. ? Resume sexual activity.   General instructions  If the catheter site starts to bleed, raise your arm and put firm pressure on the site. If the bleeding does not stop, get help right away. This is a medical emergency.  If you went home on the same day as your procedure, a responsible adult should be with you for the first 24 hours after you arrive home.  Keep all follow-up visits as told by your health care provider. This is important. Contact a health care provider if:  You have a fever.  You have redness, swelling, or yellow drainage around your insertion site. Get help right away if:  You have unusual pain at the radial site.  The catheter insertion area swells very fast.  The insertion area is bleeding, and the bleeding does not stop when you hold steady pressure on the area.  Your arm or hand becomes pale, cool, tingly, or numb. These symptoms may represent a serious   problem that is an emergency. Do not wait to see if the symptoms will go away. Get medical help right away. Call your local emergency services (911 in the [place] Do not drive yourself to the hospital. Summary  After the procedure, it is common to have bruising and tenderness at the site.  Follow instructions from your health care provider about how to take care of your radial site wound. Check the wound every day for signs of infection.  Do not lift anything that is heavier than 10 lb (4.5 kg), or the limit that you are told, until your health care provider says that it  is safe. This information is not intended to replace advice given to you by your health care provider. Make sure you discuss any questions you have with your health care provider. Document Revised: 07/06/2017 Document Reviewed: 07/06/2017 Elsevier Patient Education  2021 Elsevier Inc.  

## 2020-08-06 NOTE — Interval H&P Note (Signed)
Cath Lab Visit (complete for each Cath Lab visit)  Clinical Evaluation Leading to the Procedure:   ACS: No.  Non-ACS:    Anginal Classification: CCS III  Anti-ischemic medical therapy: Minimal Therapy (1 class of medications)  Non-Invasive Test Results: High-risk stress test findings: cardiac mortality >3%/year  Prior CABG: No previous CABG   Found to be anemic.  No evidence of bleeding FOBT negative.   History and Physical Interval Note:  08/06/2020 8:45 AM  Lauren [name]  has presented today for surgery, with the diagnosis of chest pain.  The various methods of treatment have been discussed with the patient and family. After consideration of risks, benefits and other options for treatment, the patient has consented to  Procedure(s): LEFT HEART CATH AND CORONARY ANGIOGRAPHY (N/A) as a surgical intervention.  The patient's history has been reviewed, patient examined, no change in status, stable for surgery.  I have reviewed the patient's chart and labs.  Questions were answered to the patient's satisfaction.     Larae Grooms

## 2020-08-06 NOTE — Progress Notes (Signed)
Pt ambulatory to bathroom, tolerated well, right wrist unremarkable

## 2020-08-08 NOTE — Telephone Encounter (Signed)
Form completed by Dr. Irish Lack and faxed to [facility] on 08/08/20. JG

## 2020-09-02 ENCOUNTER — Telehealth: Payer: Self-pay | Admitting: *Deleted

## 2020-09-02 NOTE — Telephone Encounter (Signed)
Per Dr Julio Sicks does not need to schedule follow up appointment-will see prn.

## 2020-11-07 ENCOUNTER — Telehealth: Payer: Self-pay

## 2020-11-07 DIAGNOSIS — Z006 Encounter for examination for normal comparison and control in clinical research program: Secondary | ICD-10-CM

## 2020-11-07 NOTE — Telephone Encounter (Signed)
Called patient for 90 day Identify phone call pt stated she is not having anymore cardiac symptoms but did follow up with PCP, I reminded the patient that we would be calling her back around February for a year follow up phone call.  

## 2020-12-27 ENCOUNTER — Emergency Department (HOSPITAL_COMMUNITY): Payer: BC Managed Care – PPO

## 2020-12-27 ENCOUNTER — Other Ambulatory Visit: Payer: Self-pay

## 2020-12-27 ENCOUNTER — Emergency Department (HOSPITAL_COMMUNITY)
Admission: EM | Admit: 2020-12-27 | Discharge: 2020-12-27 | Disposition: A | Discharge status: Home / Self Care | Payer: BC Managed Care – PPO | Attending: Emergency Medicine | Admitting: Emergency Medicine

## 2020-12-27 ENCOUNTER — Encounter (HOSPITAL_COMMUNITY): Payer: Self-pay | Admitting: Emergency Medicine

## 2020-12-27 DIAGNOSIS — F17210 Nicotine dependence, cigarettes, uncomplicated: Secondary | ICD-10-CM | POA: Insufficient documentation

## 2020-12-27 DIAGNOSIS — R0602 Shortness of breath: Secondary | ICD-10-CM | POA: Diagnosis not present

## 2020-12-27 DIAGNOSIS — R002 Palpitations: Secondary | ICD-10-CM | POA: Diagnosis not present

## 2020-12-27 DIAGNOSIS — R42 Dizziness and giddiness: Secondary | ICD-10-CM | POA: Insufficient documentation

## 2020-12-27 DIAGNOSIS — R61 Generalized hyperhidrosis: Secondary | ICD-10-CM | POA: Diagnosis not present

## 2020-12-27 DIAGNOSIS — I10 Essential (primary) hypertension: Secondary | ICD-10-CM | POA: Insufficient documentation

## 2020-12-27 DIAGNOSIS — R5383 Other fatigue: Secondary | ICD-10-CM | POA: Insufficient documentation

## 2020-12-27 DIAGNOSIS — R5381 Other malaise: Secondary | ICD-10-CM | POA: Insufficient documentation

## 2020-12-27 DIAGNOSIS — Z79899 Other long term (current) drug therapy: Secondary | ICD-10-CM | POA: Diagnosis not present

## 2020-12-27 DIAGNOSIS — R059 Cough, unspecified: Secondary | ICD-10-CM | POA: Diagnosis not present

## 2020-12-27 LAB — BASIC METABOLIC PANEL
Anion gap: 7 (ref 5–15)
BUN: 17 mg/dL (ref 6–20)
CO2: 25 mmol/L (ref 22–32)
Calcium: 9.5 mg/dL (ref 8.9–10.3)
Chloride: 107 mmol/L (ref 98–111)
Creatinine, Ser: 0.71 mg/dL (ref 0.44–1.00)
GFR, Estimated: >60 mL/min (ref >60)
Glucose, Bld: 100 mg/dL — ABNORMAL HIGH (ref 70–99)
Potassium: 3.5 mmol/L (ref 3.5–5.1)
Sodium: 139 mmol/L (ref 135–145)

## 2020-12-27 LAB — TROPONIN I (HIGH SENSITIVITY)
Troponin I (High Sensitivity): 4 ng/L (ref <18)
Troponin I (High Sensitivity): 4 ng/L (ref <18)

## 2020-12-27 LAB — CBC
HCT: 39.4 % (ref 36.0–46.0)
Hemoglobin: 12.5 g/dL (ref 12.0–15.0)
MCH: 28.9 pg (ref 26.0–34.0)
MCHC: 31.7 g/dL (ref 30.0–36.0)
MCV: 91.2 fL (ref 80.0–100.0)
Platelets: 355 10*3/uL (ref 150–400)
RBC: 4.32 MIL/uL (ref 3.87–5.11)
RDW: 13.2 % (ref 11.5–15.5)
WBC: 12.1 10*3/uL — ABNORMAL HIGH (ref 4.0–10.5)
nRBC: 0 % (ref 0.0–0.2)

## 2020-12-27 NOTE — ED Provider Notes (Signed)
[facility] EMERGENCY DEPARTMENT Provider Note   CSN: [id number] Arrival date & time: 12/27/20  0533     History Chief Complaint  Patient presents with   Palpitations     Lauren [name] is a 55 y.o. female.  She is here with complaint of palpitations have been going on for months.  She is seen cardiology for this.  She had a cath few months ago.  She was put on some medication by her primary care doctor but she says it makes it worse.  Causes her to feel short of breath lightheaded and sweaty.  No chest pain currently.  Chronic cough.  Former smoker.  Denies any alcohol or drugs.  No vomiting or diarrhea.  The history is provided by the patient.  Palpitations Palpitations quality:  Fast Onset quality:  Gradual Duration: months. Timing:  Intermittent Progression:  Unchanged Chronicity:  Recurrent Context: not illicit drugs and not nicotine   Relieved by:  Nothing Worsened by:  Nothing Ineffective treatments:  None tried Associated symptoms: cough, diaphoresis, dizziness, malaise/fatigue and shortness of breath   Associated symptoms: no chest pain, no nausea, no syncope and no vomiting   Risk factors: no hx of atrial fibrillation       Past Medical History:  Diagnosis Date   Allergy    seasonal   Boils 12/15/2010   multiple axilla   Carpal tunnel syndrome of left wrist    Chronic headaches 2008    improving   Constipation    Constipation 03/19/2015   DDD (degenerative disc disease), cervical    De Quervain's disease (tenosynovitis)    Right per pt   Encounter for Papanicolaou smear for cervical cancer screening 12/08/2016   Epigastric pain 09/21/2017   Heart murmur    History of Heart murmur as child.   Heart palpitations    Heartburn    Occ   HLD (hyperlipidemia) 2008   Hypertension    Menorrhagia    OA (osteoarthritis) of neck    Palpable abdominal aorta 09/21/2017   Right hip pain    Right ovarian cyst 11/16/2009   2 small, noted on  pelvic US   Seizures ([place])    history of one time   Shoulder pain, right 07/2008   Ventral hernia    Weight loss 12/30/2017    Patient Active Problem List   Diagnosis Date Noted   Abnormal cardiac CT angiography    Neuromyelitis optica (devic) ([place]) 01/10/2020   High risk medication use 01/10/2020   Transverse myelitis ([place]) 01/10/2020   Optic neuritis, right 01/10/2020   Renal cyst, acquired, right 10/24/2019   Sleep deprivation 01/10/2019   Recurrent abdominal hernia without obstruction or gangrene 09/21/2017   De Quervain's tenosynovitis, left 08/10/2017   Encounter for Papanicolaou smear for cervical cancer screening 12/08/2016   Seasonal allergies 12/08/2016   Carpal tunnel syndrome 12/08/2016   Cervical radiculopathy 01/19/2016   SVT (supraventricular tachycardia) ([place]) 09/10/2015   Colon cancer screening 03/19/2015   Smoking 1/2 pack a day or less 03/14/2014   Dyslipidemia 04/07/2007   Essential hypertension 05/20/2006    Past Surgical History:  Procedure Laterality Date   COLONOSCOPY W/ POLYPECTOMY  12/24/2015   INSERTION OF MESH N/A 10/25/2017   Procedure: INSERTION OF MESH;  Surgeon: Kieth Brightly Arta Bruce, MD;  Location: [facility];  Service: General;  Laterality: N/A;   LEFT HEART CATH AND CORONARY ANGIOGRAPHY N/A 08/06/2020   Procedure: LEFT HEART CATH AND CORONARY ANGIOGRAPHY;  Surgeon:  Jettie Booze, MD;  Location: [facility];  Service: Cardiovascular;  Laterality: N/A;   VENTRAL HERNIA REPAIR N/A 10/25/2017   Procedure: LAPAROSCOPIC VENTRAL HERNIA;  Surgeon: Kieth Brightly, Arta Bruce, MD;  Location: [facility];  Service: General;  Laterality: N/A;     OB History   No obstetric history on file.     Family History  Problem Relation Age of Onset   Brain cancer Mother    Heart failure Father    Cirrhosis Brother        alcohol related   Heart disease Maternal Grandmother    Breast cancer Neg Hx    Colon cancer Neg  Hx    Esophageal cancer Neg Hx    Stomach cancer Neg Hx     Social History   Tobacco Use   Smoking status: Every Day    Packs/day: 0.30    Years: 36.00    Pack years: 10.80    Types: Cigarettes   Smokeless tobacco: Never   Tobacco comments:    1/2 pack per day  Vaping Use   Vaping Use: Never used  Substance Use Topics   Alcohol use: Yes    Alcohol/week: 0.0 standard drinks    Comment: seldom   Drug use: Not Currently    Types: Marijuana    Comment: 2-3 times per week for pain    Home Medications Prior to Admission medications   Medication Sig Start Date End Date Taking? Authorizing Provider  amLODipine (NORVASC) 10 MG tablet TAKE 1 TABLET(10 MG) BY MOUTH DAILY Patient taking differently: Take 10 mg by mouth daily. 03/14/19   Kathi Ludwig, MD  cholecalciferol (VITAMIN D3) 25 MCG (1000 UNIT) tablet Take 1,000 Units by mouth daily.    [provider]  lisinopril (ZESTRIL) 40 MG tablet TAKE 1 TABLET BY MOUTH EVERY DAY Patient taking differently: Take 40 mg by mouth daily. TAKE 1 TABLET BY MOUTH EVERY DAY 06/12/19   Kathi Ludwig, MD  Multiple Vitamins-Minerals (CENTRUM SILVER 50+WOMEN PO) Take by mouth.    [provider]  rosuvastatin (CRESTOR) 5 MG tablet Take 5 mg by mouth at bedtime.    [provider]  Wheat Dextrin (BENEFIBER PO) Take 2 Scoops by mouth daily.    [provider]    Allergies    Penicillins and Other  Review of Systems   Review of Systems  Constitutional:  Positive for diaphoresis and malaise/fatigue. Negative for fever.  HENT:  Negative for sore throat.   Eyes:  Negative for pain.  Respiratory:  Positive for cough and shortness of breath.   Cardiovascular:  Positive for palpitations. Negative for chest pain and syncope.  Gastrointestinal:  Negative for abdominal pain, nausea and vomiting.  Genitourinary:  Negative for dysuria.  Musculoskeletal:  Negative for neck pain.  Skin:  Negative for rash.   Neurological:  Positive for dizziness.   Physical Exam Updated Vital Signs BP (!) 146/83 (BP Location: Right Arm)   Pulse 93   Temp 98.2 F (36.8 C) (Oral)   Resp 18   Ht 5\' 6"  (1.676 m)   Wt 75 kg   LMP 08/09/2012   SpO2 100%   BMI 26.69 kg/m   Physical Exam Vitals and nursing note reviewed.  Constitutional:      General: She is not in acute distress.    Appearance: Normal appearance. She is well-developed.  HENT:     Head: Normocephalic and atraumatic.  Eyes:     Conjunctiva/sclera: Conjunctivae  normal.  Cardiovascular:     Rate and Rhythm: Normal rate and regular rhythm.     Heart sounds: No murmur heard. Pulmonary:     Effort: Pulmonary effort is normal. No respiratory distress.     Breath sounds: Normal breath sounds.  Abdominal:     Palpations: Abdomen is soft.     Tenderness: There is no abdominal tenderness. There is no guarding or rebound.  Musculoskeletal:        General: Normal range of motion.     Cervical back: Neck supple.     Right lower leg: No edema.     Left lower leg: No edema.  Skin:    General: Skin is warm and dry.  Neurological:     General: No focal deficit present.     Mental Status: She is alert.     Gait: Gait normal.    ED Results / Procedures / Treatments   Labs (all labs ordered are listed, but only abnormal results are displayed) Labs Reviewed  BASIC METABOLIC PANEL - Abnormal; Notable for the following components:      Result Value   Glucose, Bld 100 (*)    All other components within normal limits  CBC - Abnormal; Notable for the following components:   WBC 12.1 (*)    All other components within normal limits  TROPONIN I (HIGH SENSITIVITY)  TROPONIN I (HIGH SENSITIVITY)    EKG EKG Interpretation  Date/Time:  Saturday December 27 2020 08:43:02 EDT Ventricular Rate:  71 PR Interval:  171 QRS Duration: 79 QT Interval:  390 QTC Calculation: 424 R Axis:   17 Text Interpretation: Sinus rhythm Anteroseptal infarct, old  No significant change since prior 2/22 Confirmed by Aletta Edouard [phone]) on 12/27/2020 8:54:16 AM  Radiology DG Chest 2 View  Result Date: 12/27/2020 CLINICAL DATA:  55 year old female with palpitations. Unresolved with oral medication. EXAM: CHEST - 2 VIEW COMPARISON:  Cardiac CT 07/23/2020 and earlier. FINDINGS: Lung volumes and mediastinal contours remain normal. The lungs are stable and clear. No pneumothorax or pleural effusion. Visualized tracheal air column is within normal limits. No acute osseous abnormality identified. Mild scoliosis and spine degeneration. Negative visible bowel gas. IMPRESSION: No acute cardiopulmonary abnormality. Electronically Signed   By: Genevie Ann M.D.   On: 12/27/2020 06:10    Procedures Procedures   Medications Ordered in ED Medications - No data to display  ED Course  I have reviewed the triage vital signs and the nursing notes.  Pertinent labs & imaging results that were available during my care of the patient were reviewed by me and considered in my medical decision making (see chart for details).  Clinical Course as of 12/27/20 1728  Sat Dec 27, 2020  0923 Reviewed work-up with patient.  Recommended close follow-up with her primary care doctor and her cardiologist.  Return instructions discussed [MB]    Clinical Course User Index [MB] Hayden Rasmussen, MD   MDM Rules/Calculators/A&P                         This patient complains of rapid heart rate palpitations lightheadedness; this involves an extensive number of treatment Options and is a complaint that carries with it a high risk of complications and Morbidity. The differential includes arrhythmia, metabolic derangement, anemia, PE, pneumothorax  I ordered, reviewed and interpreted labs, which included CBC with mildly elevated white count, normal hemoglobin, chemistries normal, troponins flat  I ordered imaging studies which  included chest x-ray and I independently    visualized and  interpreted imaging which showed no acute findings Previous records obtained and reviewed In Epic, Patient Has Been Seen by Cardiology and had a negative cath.  She did have a ZIO and had multiple episodes of brief SVT.  After the interventions stated above, I reevaluated the patient and found patient to be hemodynamically stable.  She has had no malignant rhythms while on the monitor during her time in the department.  Recommended close follow-up with PCP and cardiology.  Return instructions discussed   Final Clinical Impression(s) / ED Diagnoses Final diagnoses:  Heart palpitations    Rx / DC Orders ED Discharge Orders     None        Hayden Rasmussen, MD 12/27/20 1730

## 2020-12-27 NOTE — ED Triage Notes (Signed)
Patient reports palpitations with mild SOB, lightheaded and diaphoresis onset yesterday , denies chest pain , no fever or cough .

## 2020-12-27 NOTE — Discharge Instructions (Addendum)
You were seen in the emergency department for episodes of palpitations and not feeling well.  You had an EKG chest x-ray and blood work that did not show an obvious explanation of your symptoms.  Please continue your regular medications and follow-up with your primary care doctor and your cardiologist.  Return to the emergency department if any worsening or concerning symptoms

## 2020-12-29 ENCOUNTER — Emergency Department (HOSPITAL_COMMUNITY): Payer: BC Managed Care – PPO

## 2020-12-29 ENCOUNTER — Other Ambulatory Visit: Payer: Self-pay

## 2020-12-29 ENCOUNTER — Emergency Department (HOSPITAL_COMMUNITY)
Admission: EM | Admit: 2020-12-29 | Discharge: 2020-12-29 | Disposition: A | Discharge status: Home / Self Care | Payer: BC Managed Care – PPO | Attending: Emergency Medicine | Admitting: Emergency Medicine

## 2020-12-29 ENCOUNTER — Encounter (HOSPITAL_COMMUNITY): Payer: Self-pay | Admitting: Emergency Medicine

## 2020-12-29 DIAGNOSIS — R110 Nausea: Secondary | ICD-10-CM | POA: Diagnosis not present

## 2020-12-29 DIAGNOSIS — T801XXA Vascular complications following infusion, transfusion and therapeutic injection, initial encounter: Secondary | ICD-10-CM

## 2020-12-29 DIAGNOSIS — F17210 Nicotine dependence, cigarettes, uncomplicated: Secondary | ICD-10-CM | POA: Insufficient documentation

## 2020-12-29 DIAGNOSIS — R1011 Right upper quadrant pain: Secondary | ICD-10-CM | POA: Insufficient documentation

## 2020-12-29 DIAGNOSIS — R1013 Epigastric pain: Secondary | ICD-10-CM | POA: Insufficient documentation

## 2020-12-29 DIAGNOSIS — R531 Weakness: Secondary | ICD-10-CM | POA: Insufficient documentation

## 2020-12-29 DIAGNOSIS — I10 Essential (primary) hypertension: Secondary | ICD-10-CM | POA: Diagnosis not present

## 2020-12-29 DIAGNOSIS — Z79899 Other long term (current) drug therapy: Secondary | ICD-10-CM | POA: Diagnosis not present

## 2020-12-29 DIAGNOSIS — R109 Unspecified abdominal pain: Secondary | ICD-10-CM | POA: Diagnosis present

## 2020-12-29 DIAGNOSIS — M7989 Other specified soft tissue disorders: Secondary | ICD-10-CM

## 2020-12-29 DIAGNOSIS — N281 Cyst of kidney, acquired: Secondary | ICD-10-CM

## 2020-12-29 LAB — COMPREHENSIVE METABOLIC PANEL
ALT: 26 U/L (ref 0–44)
AST: 27 U/L (ref 15–41)
Albumin: 4.1 g/dL (ref 3.5–5.0)
Alkaline Phosphatase: 49 U/L (ref 38–126)
Anion gap: 12 (ref 5–15)
BUN: 16 mg/dL (ref 6–20)
CO2: 21 mmol/L — ABNORMAL LOW (ref 22–32)
Calcium: 9.4 mg/dL (ref 8.9–10.3)
Chloride: 104 mmol/L (ref 98–111)
Creatinine, Ser: 0.66 mg/dL (ref 0.44–1.00)
GFR, Estimated: >60 mL/min (ref >60)
Glucose, Bld: 101 mg/dL — ABNORMAL HIGH (ref 70–99)
Potassium: 4.3 mmol/L (ref 3.5–5.1)
Sodium: 137 mmol/L (ref 135–145)
Total Bilirubin: 0.8 mg/dL (ref 0.3–1.2)
Total Protein: 7.3 g/dL (ref 6.5–8.1)

## 2020-12-29 LAB — CBC WITH DIFFERENTIAL/PLATELET
Abs Immature Granulocytes: 0.06 10*3/uL (ref 0.00–0.07)
Basophils Absolute: 0.1 10*3/uL (ref 0.0–0.1)
Basophils Relative: 1 %
Eosinophils Absolute: 0.1 10*3/uL (ref 0.0–0.5)
Eosinophils Relative: 0 %
HCT: 37.6 % (ref 36.0–46.0)
Hemoglobin: 11.9 g/dL — ABNORMAL LOW (ref 12.0–15.0)
Immature Granulocytes: 0 %
Lymphocytes Relative: 14 %
Lymphs Abs: 2.1 10*3/uL (ref 0.7–4.0)
MCH: 28.9 pg (ref 26.0–34.0)
MCHC: 31.6 g/dL (ref 30.0–36.0)
MCV: 91.3 fL (ref 80.0–100.0)
Monocytes Absolute: 0.9 10*3/uL (ref 0.1–1.0)
Monocytes Relative: 6 %
Neutro Abs: 12.2 10*3/uL — ABNORMAL HIGH (ref 1.7–7.7)
Neutrophils Relative %: 79 %
Platelets: 290 10*3/uL (ref 150–400)
RBC: 4.12 MIL/uL (ref 3.87–5.11)
RDW: 13.1 % (ref 11.5–15.5)
WBC: 15.4 10*3/uL — ABNORMAL HIGH (ref 4.0–10.5)
nRBC: 0 % (ref 0.0–0.2)

## 2020-12-29 LAB — URINALYSIS, ROUTINE W REFLEX MICROSCOPIC
Bilirubin Urine: NEGATIVE
Glucose, UA: NEGATIVE mg/dL
Ketones, ur: 20 mg/dL — AB
Nitrite: NEGATIVE
Protein, ur: 30 mg/dL — AB
Specific Gravity, Urine: 1.018 (ref 1.005–1.030)
pH: 5 (ref 5.0–8.0)

## 2020-12-29 LAB — LIPASE, BLOOD: Lipase: 33 U/L (ref 11–51)

## 2020-12-29 MED ORDER — IOHEXOL 300 MG/ML  SOLN
100.0000 mL | Freq: Once | INTRAMUSCULAR | Status: AC | PRN
  Administered 2020-12-29: 100 mL via INTRAVENOUS

## 2020-12-29 NOTE — ED Notes (Signed)
CT made aware of pending CT scan for pt; pt has USIV in place.

## 2020-12-29 NOTE — ED Notes (Signed)
Patient returned from CT

## 2020-12-29 NOTE — ED Notes (Signed)
Patient transported to CT 

## 2020-12-29 NOTE — Progress Notes (Signed)
Interventional Radiology Brief Note:  PA called to bedside for evaluation of contrast extravasation.  Pt s/p CT this AM with subsequent extravasation of saline/contrast into left antecubital venous acess site.  Area currently soft but edematous, NT, intact distal pulses, sensation intact, ROM intact. No skin breakdown or blistering.   Recommend cool compress (can alternate with warm compresses as needed) to site and arm elevation as much as possible over next 24 hrs to promote adequate venous drainage.   RN made aware.  Will follow-up tomorrow.   Brynda Greathouse, MS RD PA-C

## 2020-12-29 NOTE — ED Triage Notes (Signed)
Patient reports right lateral ribcage pain onset this evening . Denies SOB , no emesis or diaphoresis , no fall or injury.

## 2020-12-29 NOTE — ED Provider Notes (Signed)
[facility] EMERGENCY DEPARTMENT Provider Note   CSN: [id number] Arrival date & time: 12/29/20  0017     History Chief Complaint  Patient presents with   Rib Pain     Ramsey C Amberg is a 55 y.o. female with a history of HLD, HTN, heart palpitations, and chronic headaches who presents to the emergency department with a chief complaint of right flank pain.  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  Right flank pain was nonradiating.  She is unable to characterize the pain.  No known aggravating or alleviating factors.  She reports that the nausea, diaphoresis, and generalized weakness, which she describes as "feeling faint" resolved spontaneously within about 5 to 10 minutes.  Flank pain persisted until she was in the waiting room of the emergency department.  No recent falls, energy days, or activities.  She denies any heavy lifting or twisting.  No fever, chills, dysuria, hematuria, vomiting, diarrhea, vaginal bleeding, discharge, shortness of breath, or chest pain.  She does report that she was having palpitations during the episode, but reports that they were not more prevalent and were unchanged from the chronic palpitations for which she is followed by cardiology.  No history of kidney stones.  No treatment prior to arrival.  Episode was not postprandial.  The history is provided by the patient and medical records. No language interpreter was used.      Past Medical History:  Diagnosis Date   Allergy    seasonal   Boils 12/15/2010   multiple axilla   Carpal tunnel syndrome of left wrist    Chronic headaches 2008    improving   Constipation    Constipation 03/19/2015   DDD (degenerative disc disease), cervical    De Quervain's disease (tenosynovitis)    Right per pt   Encounter for Papanicolaou smear for  cervical cancer screening 12/08/2016   Epigastric pain 09/21/2017   Heart murmur    History of Heart murmur as child.   Heart palpitations    Heartburn    Occ   HLD (hyperlipidemia) 2008   Hypertension    Menorrhagia    OA (osteoarthritis) of neck    Palpable abdominal aorta 09/21/2017   Right hip pain    Right ovarian cyst 11/16/2009   2 small, noted on pelvic US   Seizures ([place])    history of one time   Shoulder pain, right 07/2008   Ventral hernia    Weight loss 12/30/2017    Patient Active Problem List   Diagnosis Date Noted   Abnormal cardiac CT angiography    Neuromyelitis optica (devic) ([place]) 01/10/2020   High risk medication use 01/10/2020   Transverse myelitis ([place]) 01/10/2020   Optic neuritis, right 01/10/2020   Renal cyst, acquired, right 10/24/2019   Sleep deprivation 01/10/2019   Recurrent abdominal hernia without obstruction or gangrene 09/21/2017   De Quervain's tenosynovitis, left 08/10/2017   Encounter for Papanicolaou smear for cervical cancer screening 12/08/2016   Seasonal allergies 12/08/2016   Carpal tunnel syndrome 12/08/2016   Cervical radiculopathy 01/19/2016   SVT (supraventricular tachycardia) ([place]) 09/10/2015   Colon cancer screening 03/19/2015   Smoking 1/2 pack a day or less 03/14/2014   Dyslipidemia 04/07/2007   Essential hypertension 05/20/2006    Past Surgical History:  Procedure Laterality Date   COLONOSCOPY W/ POLYPECTOMY  12/24/2015  INSERTION OF MESH N/A 10/25/2017   Procedure: INSERTION OF MESH;  Surgeon: Kinsinger, Arta Bruce, MD;  Location: [facility];  Service: General;  Laterality: N/A;   LEFT HEART CATH AND CORONARY ANGIOGRAPHY N/A 08/06/2020   Procedure: LEFT HEART CATH AND CORONARY ANGIOGRAPHY;  Surgeon: Jettie Booze, MD;  Location: [facility];  Service: Cardiovascular;  Laterality: N/A;   VENTRAL HERNIA REPAIR N/A 10/25/2017   Procedure: LAPAROSCOPIC VENTRAL HERNIA;  Surgeon: Kieth Brightly, Arta Bruce, MD;  Location: [facility];  Service: General;  Laterality: N/A;     OB History   No obstetric history on file.     Family History  Problem Relation Age of Onset   Brain cancer Mother    Heart failure Father    Cirrhosis Brother        alcohol related   Heart disease Maternal Grandmother    Breast cancer Neg Hx    Colon cancer Neg Hx    Esophageal cancer Neg Hx    Stomach cancer Neg Hx     Social History   Tobacco Use   Smoking status: Every Day    Packs/day: 0.30    Years: 36.00    Pack years: 10.80    Types: Cigarettes   Smokeless tobacco: Never   Tobacco comments:    1/2 pack per day  Vaping Use   Vaping Use: Never used  Substance Use Topics   Alcohol use: Yes    Alcohol/week: 0.0 standard drinks    Comment: seldom   Drug use: Not Currently    Types: Marijuana    Comment: 2-3 times per week for pain    Home Medications Prior to Admission medications   Medication Sig Start Date End Date Taking? Authorizing Provider  amLODipine (NORVASC) 10 MG tablet TAKE 1 TABLET(10 MG) BY MOUTH DAILY Patient taking differently: Take 10 mg by mouth daily. 03/14/19   Kathi Ludwig, MD  cholecalciferol (VITAMIN D3) 25 MCG (1000 UNIT) tablet Take 1,000 Units by mouth daily.    [provider]  lisinopril (ZESTRIL) 40 MG tablet TAKE 1 TABLET BY MOUTH EVERY DAY Patient taking differently: Take 40 mg by mouth daily. TAKE 1 TABLET BY MOUTH EVERY DAY 06/12/19   Kathi Ludwig, MD  Multiple Vitamins-Minerals (CENTRUM SILVER 50+WOMEN PO) Take by mouth.    [provider]  rosuvastatin (CRESTOR) 5 MG tablet Take 5 mg by mouth at bedtime.    [provider]  Wheat Dextrin (BENEFIBER PO) Take 2 Scoops by mouth daily.    [provider]    Allergies    Penicillins and Other  Review of Systems   Review of Systems  Constitutional:  Positive for diaphoresis. Negative for activity change, chills, fatigue and fever.   HENT:  Negative for congestion, sneezing and sore throat.   Respiratory:  Negative for cough, shortness of breath and wheezing.   Cardiovascular:  Negative for chest pain, palpitations and leg swelling.  Gastrointestinal:  Positive for nausea. Negative for abdominal pain, anal bleeding, blood in stool, constipation, diarrhea and vomiting.  Genitourinary:  Positive for flank pain. Negative for dysuria, frequency, hematuria, urgency and vaginal bleeding.  Musculoskeletal:  Negative for back pain, myalgias, neck pain and neck stiffness.  Skin:  Negative for rash and wound.  Allergic/Immunologic: Negative for immunocompromised state.  Neurological:  Negative for dizziness, seizures, syncope, weakness, numbness and headaches.  Psychiatric/Behavioral:  Negative for confusion.    Physical Exam Updated Vital Signs BP 118/75  Pulse 78   Temp 98.4 F (36.9 C) (Oral)   Resp 16   LMP 08/09/2012   SpO2 100%   Physical Exam Vitals and nursing note reviewed.  Constitutional:      General: She is not in acute distress. HENT:     Head: Normocephalic.  Eyes:     Conjunctiva/sclera: Conjunctivae normal.  Cardiovascular:     Rate and Rhythm: Normal rate and regular rhythm.     Heart sounds: No murmur heard.   No friction rub. No gallop.  Pulmonary:     Effort: Pulmonary effort is normal. No respiratory distress.     Breath sounds: No stridor. No wheezing, rhonchi or rales.  Chest:     Chest wall: No tenderness.  Abdominal:     General: There is no distension.     Palpations: Abdomen is soft. There is no mass.     Tenderness: There is no abdominal tenderness. There is no right CVA tenderness, left CVA tenderness, guarding or rebound.     Hernia: No hernia is present.     Comments: Minimal tenderness to palpation of the right upper quadrant and epigastric region.  Negative Murphy sign.  No right-sided CVA tenderness, but with palpation of the right CVA, it causes pain to her left CVA region.   Normoactive bowel sounds.  Abdomen is soft, nondistended.  No rebound or guarding.  Musculoskeletal:     Cervical back: Neck supple.  Skin:    General: Skin is warm.     Findings: No rash.  Neurological:     Mental Status: She is alert.  Psychiatric:        Behavior: Behavior normal.    ED Results / Procedures / Treatments   Labs (all labs ordered are listed, but only abnormal results are displayed) Labs Reviewed - No data to display  EKG None  Radiology DG Chest 2 View  Result Date: 12/27/2020 CLINICAL DATA:  55 year old female with palpitations. Unresolved with oral medication. EXAM: CHEST - 2 VIEW COMPARISON:  Cardiac CT 07/23/2020 and earlier. FINDINGS: Lung volumes and mediastinal contours remain normal. The lungs are stable and clear. No pneumothorax or pleural effusion. Visualized tracheal air column is within normal limits. No acute osseous abnormality identified. Mild scoliosis and spine degeneration. Negative visible bowel gas. IMPRESSION: No acute cardiopulmonary abnormality. Electronically Signed   By: Genevie Ann M.D.   On: 12/27/2020 06:10   DG Ribs Unilateral W/Chest Right  Result Date: 12/29/2020 CLINICAL DATA:  Right chest pain EXAM: RIGHT RIBS AND CHEST - 3+ VIEW COMPARISON:  12/27/2020 FINDINGS: Lungs are clear.  No pleural effusion or pneumothorax. The heart is normal in size. No displaced right rib fracture is seen. IMPRESSION: No evidence of acute cardiopulmonary disease. No displaced right rib fracture is seen. Electronically Signed   By: Julian Hy M.D.   On: 12/29/2020 01:07    Procedures Procedures   Medications Ordered in ED Medications - No data to display  ED Course  I have reviewed the triage vital signs and the nursing notes.  Pertinent labs & imaging results that were available during my care of the patient were reviewed by me and considered in my medical decision making (see chart for details).    MDM Rules/Calculators/A&P                            55 year old female with a history of HLD, HTN, heart palpitations, and chronic headaches who  presents the emergency department with right flank pain accompanied by nausea, "feeling faint", and diaphoresis.  The latter symptoms lasted for approximately 5 to 10 minutes for resolving.  The flank pain was persistent until she arrived in the emergency department.  She had no chest pain or shortness of breath.  Vital signs are stable.  Physical exam is reassuring.  She did have minimal tenderness palpation over the left CVA region.  There is mild tenderness to palpation in the epigastric region right upper quadrant, negative Murphy sign.  The patient had a cardiac work-up 48 hours ago in the emergency department.  She had an unremarkable left heart cath in February 2022.  Symptoms today are less suspicious for cardiac etiology.  EKG with normal sinus rhythm.  Labs and imaging been reviewed and independently interpreted by me. No metabolic derangements.  Lipase is normal.  CBC, urinalysis, and CT are pending.  CT abdomen pelvis in May 2021 with indeterminate right renal lesion that was thought to be possibly a hemorrhagic cyst, but MRI was recommended without contrast for further characterization to exclude enhancing renal lesion.  She has not had repeat imaging of the right flank since this time.  Patient care transferred to PA Geiple at the end of my shift. Patient presentation, ED course, and plan of care discussed with review of all pertinent labs and imaging. Please see his/her note for further details regarding further ED course and disposition.  Final Clinical Impression(s) / ED Diagnoses Final diagnoses:  None    Rx / DC Orders ED Discharge Orders     None        Joanne Gavel, PA-C 12/29/20 0640    Fatima Blank, MD 12/29/20 2103

## 2020-12-29 NOTE — ED Provider Notes (Signed)
9:50 AM signout from [place] PA-C at shift change.  Patient with right flank pain.  Currently awaiting CT imaging.  She has a known history of a right-sided renal cyst.  Unfortunately IV infiltrated during CT imaging.  A noncontrasted study was obtained.  Patient had extensive swelling in the medial left elbow area.  She was evaluated by radiology PA.  Encouraged to keep her arm elevated, use warm and cold compresses.  Patient states that she feels more comfortable now.  No signs of distal neurovascular compromise.  She continues to do well during ED stay and will continue this at home.  CT findings are overall reassuring without any acute cause of the patient's pain.  She does have a slightly enlarging right lower pole renal cyst.  We did discuss this and I encouraged her to follow-up with her doctor regarding this.  She may require MRI for further characterization.  She verbalizes understanding.  Plan for discharged home at this time.  Encourage PCP follow-up.  Encouraged return to the emergency department if she has any worsening pain especially if it spreads to the chest or abdomen, has associated shortness of breath, fevers, or other concerning symptoms.  Patient verbalizes understanding and agrees with plan.   BP 116/66   Pulse 73   Temp 98.2 F (36.8 C) (Oral)   Resp 18   Ht 5\' 6"  (1.676 m)   Wt 72.1 kg   LMP 08/09/2012   SpO2 100%   BMI 25.66 kg/m     Carlisle Cater, PA-C 12/29/20 [id number]    Horton, Alvin Critchley, DO 12/29/20 1112

## 2020-12-29 NOTE — Progress Notes (Signed)
Pt IV infiltrated during CT exam. US guided IV infiltrated during CT contrast injection.  It was noted post scan that pt arm was swollen and painful.  Catheter was removed and warm compresses placed on site.  After several minutes pt said, pain was decreasing and was getting more comfortable.  The IV site was also becoming softer and less swollen.  Rad PA was paged.  Pt returned to the ED rm15 and RN and Dr. Caryl Bis were notified.

## 2020-12-29 NOTE — Discharge Instructions (Addendum)
Thank you for allowing me to care for you today in the Emergency Department.   Please follow-up with your cardiologist regarding episodes of palpitations.  Follow-up with primary care if you have recurrence of the pain in your side.  You can try taking Tylenol Motrin for your symptoms.  The CT today did not show any clear causes of your pain. The cyst in the bottom of the right kidney is slightly larger today than in the past. Please discuss with your primary care doctor.   Return to the emergency department if you start having worsening side pain and fever (a temperature greater than 100.4 F), fever, unable to urinate, have severe, uncontrollable pain, respiratory distress, or other new, concerning symptoms.  Return to the emergency department with worsening pain.

## 2020-12-30 ENCOUNTER — Telehealth: Payer: Self-pay | Admitting: Interventional Cardiology

## 2020-12-30 NOTE — Telephone Encounter (Signed)
Pt sent this via my chart message:

## 2020-12-30 NOTE — Telephone Encounter (Signed)
Spoke with the pt and she reports that she has had increased frequency and duration of her palpitations. She says she has been very symptomatic with them such as dizziness, feeling clammy and has had to leave work a few times recently due to not feeling well. She was in the ED 12/27/20 and was sent home due to normal EKG and vital signs and told to follow up with her Cardiologist.   Pt says her PCP gave her metoprolol about 3-4 weeks ago but she says she did not tolerate it with headaches and fatigue so she stopped it.   I gave her an appt with Gerrianne Scale PA for tomorrow 12/31/20. She will avoid caffeine and any OTC meds and will call EMS or have someone take her back to the ED if anything worsens prior to her appt.

## 2020-12-31 ENCOUNTER — Other Ambulatory Visit: Payer: Self-pay

## 2020-12-31 ENCOUNTER — Ambulatory Visit (INDEPENDENT_AMBULATORY_CARE_PROVIDER_SITE_OTHER): Payer: BC Managed Care – PPO | Admitting: Physician Assistant

## 2020-12-31 ENCOUNTER — Encounter: Payer: Self-pay | Admitting: Physician Assistant

## 2020-12-31 VITALS — BP 120/80 | HR 83 | Ht 66.0 in | Wt 156.8 lb

## 2020-12-31 DIAGNOSIS — I2510 Atherosclerotic heart disease of native coronary artery without angina pectoris: Secondary | ICD-10-CM | POA: Diagnosis not present

## 2020-12-31 DIAGNOSIS — I471 Supraventricular tachycardia: Secondary | ICD-10-CM

## 2020-12-31 DIAGNOSIS — I10 Essential (primary) hypertension: Secondary | ICD-10-CM | POA: Diagnosis not present

## 2020-12-31 DIAGNOSIS — E7849 Other hyperlipidemia: Secondary | ICD-10-CM

## 2020-12-31 MED ORDER — DILTIAZEM HCL ER COATED BEADS 240 MG PO CP24: 240.0000 mg | ORAL_CAPSULE | Freq: Every day | ORAL | 3 refills | Status: DC

## 2020-12-31 NOTE — Patient Instructions (Signed)
Medication Instructions:  Your physician has recommended you make the following change in your medication:   STOP: Amlodipine START: Diltiazem 240mg  daily  *If you need a refill on your cardiac medications before your next appointment, please call your pharmacy*   Lab Work: None If you have labs (blood work) drawn today and your tests are completely normal, you will receive your results only by: [place] (if you have MyChart) OR A paper copy in the mail If you have any lab test that is abnormal or we need to change your treatment, we will call you to review the results.   Follow-Up: At [facility], you and your health needs are our priority.  As part of our continuing mission to provide you with exceptional heart care, we have created designated Provider Care Teams.  These Care Teams include your primary Cardiologist (physician) and Advanced Practice Providers (APPs -  Physician Assistants and Nurse Practitioners) who all work together to provide you with the care you need, when you need it.   Your next appointment:   3-4 month(s)  The format for your next appointment:   In Person  Provider:   You may see Larae Grooms, MD or one of the following Advanced Practice Providers on your designated Care Team:   Melina Copa, PA-C Ermalinda Barrios, PA-C   Other Instructions Check your blood pressure and heart rate 2 hours after taking medications for 2 weeks and send [place] those readings.

## 2020-12-31 NOTE — Progress Notes (Signed)
Cardiology Office Note    Date:  12/31/2020   ID:  Lauren [name], Lauren [name] July 30, 1965, MRN [id number]   PCP:  Fanny Bien, MD   [place]  Cardiologist:  Larae Grooms, MD   Advanced Practice Provider:  No care team member to display Electrophysiologist:  None   [id number]}   Chief Complaint  Patient presents with   Follow-up   Palpitations     History of Present Illness:  Lauren [name] is a 55 y.o. female  with history of nonobstructive CAD on cardiac cath 08/06/2020 after coronary CTA was concerning for significant obstructive disease.  Calcium score was 395.  She also has a family history of CAD, hypertension, SVT on monitor done at [facility] 04/2019 previously treated with beta blocker.  Patient says she didn't tolerate metoprolol-headache and fatigue. She says her heart rate has been racing all the time. One day at work got weak, dizzy and sweating. No caffeine and quit smoking. 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    Past Medical History:  Diagnosis Date   Allergy    seasonal   Boils 12/15/2010   multiple axilla   Carpal tunnel syndrome of left wrist    Chronic headaches 2008    improving   Constipation    Constipation 03/19/2015   DDD (degenerative disc disease), cervical    De Quervain's disease (tenosynovitis)    Right per pt   Encounter for Papanicolaou smear for cervical cancer screening 12/08/2016   Epigastric pain 09/21/2017   Heart murmur    History of Heart murmur as child.   Heart palpitations    Heartburn    Occ   HLD (hyperlipidemia) 2008   Hypertension    Menorrhagia    OA (osteoarthritis) of neck    Palpable abdominal aorta 09/21/2017   Right hip pain    Right ovarian cyst 11/16/2009   2 small, noted on pelvic US   Seizures ([place])    history of one time   Shoulder pain, right 07/2008   Ventral hernia    Weight loss 12/30/2017    Past Surgical History:  Procedure Laterality Date    COLONOSCOPY W/ POLYPECTOMY  12/24/2015   INSERTION OF MESH N/A 10/25/2017   Procedure: INSERTION OF MESH;  Surgeon: Kieth Brightly Arta Bruce, MD;  Location: [facility];  Service: General;  Laterality: N/A;   LEFT HEART CATH AND CORONARY ANGIOGRAPHY N/A 08/06/2020   Procedure: LEFT HEART CATH AND CORONARY ANGIOGRAPHY;  Surgeon: Jettie Booze, MD;  Location: [facility];  Service: Cardiovascular;  Laterality: N/A;   VENTRAL HERNIA REPAIR N/A 10/25/2017   Procedure: LAPAROSCOPIC VENTRAL HERNIA;  Surgeon: Mickeal Skinner, MD;  Location: [facility];  Service: General;  Laterality: N/A;    Current Medications: Current Meds  Medication Sig   diltiazem (CARDIZEM CD) 240 MG 24 hr capsule Take 1 capsule (240 mg total) by mouth daily.   lisinopril (ZESTRIL) 40 MG tablet TAKE 1 TABLET BY MOUTH EVERY DAY   Multiple Vitamins-Minerals (CENTRUM SILVER 50+WOMEN PO) Take by mouth.   rosuvastatin (CRESTOR) 5 MG tablet Take 5 mg by mouth at bedtime.   Wheat Dextrin (BENEFIBER PO) Take 2 Scoops by mouth daily.   [DISCONTINUED] amLODipine (NORVASC) 10 MG tablet TAKE 1 TABLET(10 MG) BY MOUTH DAILY     Allergies:   Penicillins and Other   Social History   Socioeconomic History   Marital status: Soil scientist  Spouse name: Not on file   Number of children: Not on file   Years of education: Not on file   Highest education level: Not on file  Occupational History   Not on file  Tobacco Use   Smoking status: Every Day    Packs/day: 0.30    Years: 36.00    Pack years: 10.80    Types: Cigarettes   Smokeless tobacco: Never   Tobacco comments:    1/2 pack per day  Vaping Use   Vaping Use: Never used  Substance and Sexual Activity   Alcohol use: Yes    Alcohol/week: 0.0 standard drinks    Comment: seldom   Drug use: Not Currently    Types: Marijuana    Comment: 2-3 times per week for pain   Sexual activity: Yes    Birth control/protection:  Post-menopausal  Other Topics Concern   Not on file  Social History Narrative   Works as Investment banker, corporate at [employer], lives with significant other in [place]   No EToh, former beer drinker   No illicit drug use.   Current smoker, 1/2 PPDx 26 years   Caffeine use: none   Right handed    Social Determinants of Health   Financial Resource Strain: Not on file  Food Insecurity: Not on file  Transportation Needs: Not on file  Physical Activity: Not on file  Stress: Not on file  Social Connections: Not on file     Family History:  The patient's  family history includes Brain cancer in her mother; Cirrhosis in her brother; Heart disease in her maternal grandmother; Heart failure in her father.   ROS:   Please see the history of present illness.    ROS All other systems reviewed and are negative.   PHYSICAL EXAM:   VS:  BP 120/80   Pulse 83   Ht 5\' 6"  (1.676 m)   Wt 156 lb 12.8 oz (71.1 kg)   LMP 08/09/2012   SpO2 98%   BMI 25.31 kg/m   Physical Exam  GEN: Well nourished, well developed, in no acute distress  Neck: no JVD, carotid bruits, or masses Cardiac:RRR; no murmurs, rubs, or gallops  Respiratory:  clear to auscultation bilaterally, normal work of breathing GI: soft, nontender, nondistended, + BS Ext: without cyanosis, clubbing, or edema, Good distal pulses bilaterally Neuro:  Alert and Oriented x 3, Psych: euthymic mood, full affect  Wt Readings from Last 3 Encounters:  12/31/20 156 lb 12.8 oz (71.1 kg)  12/29/20 159 lb (72.1 kg)  12/27/20 165 lb 5.5 oz (75 kg)      Studies/Labs Reviewed:   EKG:  EKG is not ordered today.  The ekg  form ED 12/29/20 NSR, no acute change.  Recent Labs: 12/29/2020: ALT 26; BUN 16; Creatinine, Ser 0.66; Hemoglobin 11.9; Platelets 290; Potassium 4.3; Sodium 137   Lipid Panel    Component Value Date/Time   CHOL 158 10/08/2015 1512   TRIG 78 10/08/2015 1512   HDL 41 10/08/2015 1512   CHOLHDL 3.9 10/08/2015 1512    CHOLHDL 4.3 06/01/2013 1120   VLDL 25 06/01/2013 1120   LDLCALC 101 (H) 10/08/2015 1512    Additional studies/ records that were reviewed today include:    Cardiac cath 07/2020  Prox LAD to Mid LAD lesion is 25% stenosed. Mid Cx lesion is 25% stenosed. Mid LAD lesion is 25% stenosed. The left ventricular systolic function is normal. LV end diastolic pressure is normal. The  left ventricular ejection fraction is 55-65% by visual estimate. There is no aortic valve stenosis.   Mild, nonobstructive CAD.  Continue medical therapy and risk factor modification.   OK to return to work on Monday.  Coronary CTA 06/2020 FINDINGS: Coronary calcium score: The patient's coronary artery calcium score is 395, which places the patient in the 99th percentile.   Coronary arteries: Normal coronary origins. Likely left dominance but unable to fully evaluate distal RCA and LCx.   Right Coronary Artery: Small caliber vessel.  Not well visualized.   Left Main Coronary Artery: Normal caliber vessel. No significant plaque or stenosis.   Left Anterior Descending Coronary Artery: Normal caliber vessel, proximal portion appears tortuous. Calcified plaque in the proximal LAD with 25-49% stenosis. There is a mixed calcified and noncalcified plaque concerning for 70-99% stenosis. Mid LAD has a calcified plaque with at least 25-49% stenosis. Gives rise to 1 diagonal branches.   Left Circumflex Artery: Normal caliber vessel. Proximal LCx has mixed calcified and noncalcified plaque with 1-24% stenosis. There is calcified plaque in the mid LCx, but unable to determine stenosis due to poor contrast in mid to distal vessel.   Aorta: Normal size, 27 mm at the mid ascending aorta (level of the PA bifurcation) measured double oblique. Scattered calcifications consistent with aortic atherosclerosis. No dissection.   Aortic Valve: Trivial calcifications. Trileaflet.   Other findings:   Normal pulmonary  vein drainage into the left atrium.   Normal left atrial appendage without a thrombus.   Normal size of the pulmonary artery.   IMPRESSION: 1. Concern for severe obstructive CAD, CADRADS = 4. CT FFR will be performed and reported separately. Mid LAD has very concerning mixed calcified and noncalcified plaque that appears to have 70-99% stenosis. However, images are challenging due to suboptimal arterial phase images.   2. Coronary calcium score of 395. This was 99th percentile for age and sex matched control.   3. Normal coronary origin with likely left dominance.   4. Aortic atherosclerosis.   5. Technically limited study, intra-arterial contrast not optimal.     Electronically Signed   By: Buford Dresser M.D.   On: 07/24/2020 14:29    Eval at [facility] in 04/2019 showed: "The Zio patch reading was notable for 50 runs of nonsustained SVT, the fastest of which was approximately 18 beats with a heart rate of 169 bpm. It should be noted, though, that the patient never triggered her monitor during any of these 50 episodes of nonsustained SVT. She only reported symptoms occasionally with some SV ectopy, isolated or couplets, as well as during periods of normal sinus rhythm. It should be noted though that the patient states that the symptoms she recorded while wearing the ZIO Patch were the same ones that prompted her to go to the [facility] of the first place. The echocardiogram was notable for an LVEF of 60 to 65%, with normal left ventricular function and size. There was, however, impaired relaxation pattern of LV diastolic filling noted."   Risk Assessment/Calculations:         ASSESSMENT:    1. SVT (supraventricular tachycardia) ([place])   2. Coronary artery disease involving native coronary artery of native heart without angina pectoris   3. Essential hypertension   4. Other hyperlipidemia      PLAN:  In order of problems listed above:   Palpitations with documented  SVT on monitor in the past at [facility].  Patient was previously on metoprolol but did not  tolerate with fatigue and headaches.  We will try diltiazem 240 mg once daily.  We will stop amlodipine.  She will check her blood pressure and pulse daily for 2 weeks and let us know how she is doing.  If she continues to have palpitations may need to order another monitor.  Nonobstructive CAD on cardiac cath 07/2020 on statin  Hypertension on lisinopril and now adding diltiazem and stopping amlodipine  Hyperlipidemia LDL was 109 in January.  She was started on Crestor.  She had lab work by Dr. Sarina Ill recently.  We will try to get those records.   Shared Decision Making/Informed Consent        Medication Adjustments/Labs and Tests Ordered: Current medicines are reviewed at length with the patient today.  Concerns regarding medicines are outlined above.  Medication changes, Labs and Tests ordered today are listed in the Patient Instructions below. Patient Instructions  Medication Instructions:  Your physician has recommended you make the following change in your medication:   STOP: Amlodipine START: Diltiazem 240mg  daily  *If you need a refill on your cardiac medications before your next appointment, please call your pharmacy*   Lab Work: None If you have labs (blood work) drawn today and your tests are completely normal, you will receive your results only by: [place] (if you have MyChart) OR A paper copy in the mail If you have any lab test that is abnormal or we need to change your treatment, we will call you to review the results.   Follow-Up: At [facility], you and your health needs are our priority.  As part of our continuing mission to provide you with exceptional heart care, we have created designated Provider Care Teams.  These Care Teams include your primary Cardiologist (physician) and Advanced Practice Providers (APPs -  Physician Assistants and Nurse Practitioners) who  all work together to provide you with the care you need, when you need it.   Your next appointment:   3-4 month(s)  The format for your next appointment:   In Person  Provider:   You may see Larae Grooms, MD or one of the following Advanced Practice Providers on your designated Care Team:   Melina Copa, PA-C Ermalinda Barrios, PA-C   Other Instructions Check your blood pressure and heart rate 2 hours after taking medications for 2 weeks and send [place] those readings.     Sumner Boast, PA-C  12/31/2020 10:15 AM    [facility] [place], [place], [place]  [address] Phone: [phone]; Fax: [phone]

## 2021-01-02 ENCOUNTER — Ambulatory Visit: Payer: BC Managed Care – PPO | Admitting: Family Medicine

## 2021-01-19 ENCOUNTER — Ambulatory Visit (INDEPENDENT_AMBULATORY_CARE_PROVIDER_SITE_OTHER): Payer: BC Managed Care – PPO

## 2021-01-19 DIAGNOSIS — R002 Palpitations: Secondary | ICD-10-CM

## 2021-01-19 MED ORDER — DILTIAZEM HCL 30 MG PO TABS: 30.0000 mg | ORAL_TABLET | Freq: Four times a day (QID) | ORAL | 3 refills | Status: AC | PRN

## 2021-01-19 NOTE — Telephone Encounter (Signed)
Called patient and reviewed advice from Ermalinda Barrios, [place]. Patient verbalized understanding regarding instructions to take diltiazem 30 mg as needed for fluttering/palps and need for Zio monitor. I advised that I will send the monitor instructions through her MyChart. I advised patient to call back with questions or concerns and she thanked me for the call.

## 2021-01-19 NOTE — Progress Notes (Unsigned)
Patient enrolled for [employer] to mail a 14 day ZIO XT monitor to her address on file.

## 2021-01-22 DIAGNOSIS — R002 Palpitations: Secondary | ICD-10-CM

## 2021-02-06 ENCOUNTER — Other Ambulatory Visit: Payer: Self-pay | Admitting: Family Medicine

## 2021-02-06 DIAGNOSIS — N281 Cyst of kidney, acquired: Secondary | ICD-10-CM

## 2021-02-13 ENCOUNTER — Telehealth: Payer: Self-pay | Admitting: *Deleted

## 2021-02-13 DIAGNOSIS — I472 Ventricular tachycardia, unspecified: Secondary | ICD-10-CM

## 2021-02-13 DIAGNOSIS — I471 Supraventricular tachycardia: Secondary | ICD-10-CM

## 2021-02-13 NOTE — Telephone Encounter (Signed)
Patient notified.  She would like to proceed with EP referral.

## 2021-02-13 NOTE — Telephone Encounter (Signed)
-----   Message from Jettie Booze, MD sent at 02/09/2021 10:15 PM EDT ----- Some episode of nonsustained VT, brief SVT as well.  No significant symptoms noted.  Jeneen Rinks, Any other ideas?  Nonobstructive disease by cath in 07/2020.  Normal LVEF by ventriculogram. Will refer to EP to consider further eval of ventricular tach.  May need to consider cardiac MRI.

## 2021-02-18 ENCOUNTER — Encounter (HOSPITAL_COMMUNITY): Payer: Self-pay | Admitting: Emergency Medicine

## 2021-02-18 ENCOUNTER — Other Ambulatory Visit: Payer: Self-pay

## 2021-02-18 ENCOUNTER — Emergency Department (HOSPITAL_COMMUNITY)
Admission: EM | Admit: 2021-02-18 | Discharge: 2021-02-18 | Disposition: A | Discharge status: Home / Self Care | Payer: BC Managed Care – PPO | Attending: Emergency Medicine | Admitting: Emergency Medicine

## 2021-02-18 ENCOUNTER — Emergency Department (HOSPITAL_COMMUNITY): Payer: BC Managed Care – PPO

## 2021-02-18 DIAGNOSIS — R0609 Other forms of dyspnea: Secondary | ICD-10-CM | POA: Insufficient documentation

## 2021-02-18 DIAGNOSIS — Z87891 Personal history of nicotine dependence: Secondary | ICD-10-CM | POA: Insufficient documentation

## 2021-02-18 DIAGNOSIS — N898 Other specified noninflammatory disorders of vagina: Secondary | ICD-10-CM | POA: Insufficient documentation

## 2021-02-18 DIAGNOSIS — I10 Essential (primary) hypertension: Secondary | ICD-10-CM | POA: Insufficient documentation

## 2021-02-18 DIAGNOSIS — Z79899 Other long term (current) drug therapy: Secondary | ICD-10-CM | POA: Diagnosis not present

## 2021-02-18 DIAGNOSIS — R110 Nausea: Secondary | ICD-10-CM | POA: Insufficient documentation

## 2021-02-18 DIAGNOSIS — R079 Chest pain, unspecified: Secondary | ICD-10-CM | POA: Diagnosis not present

## 2021-02-18 DIAGNOSIS — Z955 Presence of coronary angioplasty implant and graft: Secondary | ICD-10-CM | POA: Insufficient documentation

## 2021-02-18 DIAGNOSIS — R0789 Other chest pain: Secondary | ICD-10-CM | POA: Insufficient documentation

## 2021-02-18 DIAGNOSIS — R519 Headache, unspecified: Secondary | ICD-10-CM | POA: Diagnosis not present

## 2021-02-18 DIAGNOSIS — R61 Generalized hyperhidrosis: Secondary | ICD-10-CM | POA: Insufficient documentation

## 2021-02-18 LAB — COMPREHENSIVE METABOLIC PANEL
ALT: 23 U/L (ref 0–44)
AST: 27 U/L (ref 15–41)
Albumin: 4 g/dL (ref 3.5–5.0)
Alkaline Phosphatase: 53 U/L (ref 38–126)
Anion gap: 9 (ref 5–15)
BUN: 15 mg/dL (ref 6–20)
CO2: 23 mmol/L (ref 22–32)
Calcium: 9.3 mg/dL (ref 8.9–10.3)
Chloride: 107 mmol/L (ref 98–111)
Creatinine, Ser: 0.65 mg/dL (ref 0.44–1.00)
GFR, Estimated: >60 mL/min (ref >60)
Glucose, Bld: 102 mg/dL — ABNORMAL HIGH (ref 70–99)
Potassium: 4.2 mmol/L (ref 3.5–5.1)
Sodium: 139 mmol/L (ref 135–145)
Total Bilirubin: 0.6 mg/dL (ref 0.3–1.2)
Total Protein: 7.2 g/dL (ref 6.5–8.1)

## 2021-02-18 LAB — I-STAT BETA HCG BLOOD, ED (MC, WL, AP ONLY): I-stat hCG, quantitative: <5 m[IU]/mL (ref <5)

## 2021-02-18 LAB — CBC
HCT: 39.3 % (ref 36.0–46.0)
Hemoglobin: 12.6 g/dL (ref 12.0–15.0)
MCH: 29.9 pg (ref 26.0–34.0)
MCHC: 32.1 g/dL (ref 30.0–36.0)
MCV: 93.1 fL (ref 80.0–100.0)
Platelets: 320 10*3/uL (ref 150–400)
RBC: 4.22 MIL/uL (ref 3.87–5.11)
RDW: 13.5 % (ref 11.5–15.5)
WBC: 11.5 10*3/uL — ABNORMAL HIGH (ref 4.0–10.5)
nRBC: 0 % (ref 0.0–0.2)

## 2021-02-18 LAB — TROPONIN I (HIGH SENSITIVITY): Troponin I (High Sensitivity): 5 ng/L (ref <18)

## 2021-02-18 NOTE — Discharge Instructions (Addendum)
You were seen in the ED for chest pain. Your workup was not concerning for emergent cause of chest pain. We obtained EKG with no evidence of arrhythmia.  It is best that you schedule an appointment with your cardiologist within the next week. Please return to the ED if your symptoms worsen.

## 2021-02-18 NOTE — ED Triage Notes (Signed)
Pt here from home with c/o cp and nausea , slight sob , had a clean cath recently , is in aflutter and cards has been unable to control it

## 2021-02-18 NOTE — ED Provider Notes (Signed)
[name] EMERGENCY DEPARTMENT Provider Note   CSN: [id number] Arrival date & time: 02/18/21  1421     History No chief complaint on file.   Lauren [name] is a 55 y.o. female.  She has a past medical history of hypertension, dyslipidemia, SVT  She states that she started having chest pain yesterday afternoon while she was working.  She states that the chest pain initially felt sharp however has transitioned to feeling like tightness.  It has been constant but it has improved since it initially began.  She says it is worse with movement and better when she lays down.  She endorses associated nausea with no vomiting.  She says that she has had increased dyspnea with exertion.  She also has an associated headache.  She says earlier today she started sweating.  This that she has had similar feelings of chest discomfort in the past when she was being evaluated for SVT.  She says when they did the cardiac cath she did not have any concerning findings.  She is recent been evaluated by cardiology for SVT.  She will underwent a cardiac catheterization in February with no significant stenosis.  Her most recent echo without left ventricular dysfunction.  Multiple runs of nonsustained SVT found on Zio patch monitor  HPI  HPI: A 55 year old patient with a history of hypertension and hypercholesterolemia presents for evaluation of chest pain. Initial onset of pain was less than one hour ago. The patient's chest pain is described as heaviness/pressure/tightness and is worse with exertion. The patient complains of nausea and reports some diaphoresis. The patient's chest pain is not middle- or left-sided, is not well-localized, is not sharp and does not radiate to the arms/jaw/neck. The patient has no history of stroke, has no history of peripheral artery disease, has not smoked in the past 90 days, denies any history of treated diabetes, has no relevant family history of coronary artery  disease (first degree relative at less than age 33) and does not have an elevated BMI (>=30).   Past Medical History:  Diagnosis Date   Allergy    seasonal   Boils 12/15/2010   multiple axilla   Carpal tunnel syndrome of left wrist    Chronic headaches 2008    improving   Constipation    Constipation 03/19/2015   DDD (degenerative disc disease), cervical    De Quervain's disease (tenosynovitis)    Right per pt   Encounter for Papanicolaou smear for cervical cancer screening 12/08/2016   Epigastric pain 09/21/2017   Heart murmur    History of Heart murmur as child.   Heart palpitations    Heartburn    Occ   HLD (hyperlipidemia) 2008   Hypertension    Menorrhagia    OA (osteoarthritis) of neck    Palpable abdominal aorta 09/21/2017   Right hip pain    Right ovarian cyst 11/16/2009   2 small, noted on pelvic US   Seizures ([place])    history of one time   Shoulder pain, right 07/2008   Ventral hernia    Weight loss 12/30/2017    Patient Active Problem List   Diagnosis Date Noted   Abnormal cardiac CT angiography    Neuromyelitis optica (devic) ([place]) 01/10/2020   High risk medication use 01/10/2020   Transverse myelitis ([place]) 01/10/2020   Optic neuritis, right 01/10/2020   Renal cyst, acquired, right 10/24/2019   Sleep deprivation 01/10/2019   Recurrent abdominal hernia without obstruction or gangrene  09/21/2017   De Quervain's tenosynovitis, left 08/10/2017   Encounter for Papanicolaou smear for cervical cancer screening 12/08/2016   Seasonal allergies 12/08/2016   Carpal tunnel syndrome 12/08/2016   Cervical radiculopathy 01/19/2016   SVT (supraventricular tachycardia) ([place]) 09/10/2015   Colon cancer screening 03/19/2015   Smoking 1/2 pack a day or less 03/14/2014   Dyslipidemia 04/07/2007   Essential hypertension 05/20/2006    Past Surgical History:  Procedure Laterality Date   COLONOSCOPY W/ POLYPECTOMY  12/24/2015   INSERTION OF MESH N/A 10/25/2017   Procedure:  INSERTION OF MESH;  Surgeon: Kieth Brightly Arta Bruce, MD;  Location: [facility];  Service: General;  Laterality: N/A;   LEFT HEART CATH AND CORONARY ANGIOGRAPHY N/A 08/06/2020   Procedure: LEFT HEART CATH AND CORONARY ANGIOGRAPHY;  Surgeon: Jettie Booze, MD;  Location: [facility];  Service: Cardiovascular;  Laterality: N/A;   VENTRAL HERNIA REPAIR N/A 10/25/2017   Procedure: LAPAROSCOPIC VENTRAL HERNIA;  Surgeon: Kieth Brightly, Arta Bruce, MD;  Location: [facility];  Service: General;  Laterality: N/A;     OB History   No obstetric history on file.     Family History  Problem Relation Age of Onset   Brain cancer Mother    Heart failure Father    Cirrhosis Brother        alcohol related   Heart disease Maternal Grandmother    Breast cancer Neg Hx    Colon cancer Neg Hx    Esophageal cancer Neg Hx    Stomach cancer Neg Hx     Social History   Tobacco Use   Smoking status: Former    Packs/day: 0.30    Years: 36.00    Pack years: 10.80    Types: Cigarettes   Smokeless tobacco: Never   Tobacco comments:    1/2 pack per day  Vaping Use   Vaping Use: Never used  Substance Use Topics   Alcohol use: Yes    Alcohol/week: 0.0 standard drinks    Comment: seldom   Drug use: Not Currently    Types: Marijuana    Comment: 2-3 times per week for pain    Home Medications Prior to Admission medications   Medication Sig Start Date End Date Taking? Authorizing Provider  diltiazem (CARDIZEM CD) 240 MG 24 hr capsule Take 1 capsule (240 mg total) by mouth daily. 12/31/20 03/31/21  Imogene Burn, PA-C  diltiazem (CARDIZEM) 30 MG tablet Take 1 tablet (30 mg total) by mouth 4 (four) times daily as needed (palpitations/fast heart rate). 01/19/21   Imogene Burn, PA-C  lisinopril (ZESTRIL) 40 MG tablet TAKE 1 TABLET BY MOUTH EVERY DAY 06/12/19   Kathi Ludwig, MD  Multiple Vitamins-Minerals (CENTRUM SILVER 50+WOMEN PO) Take by mouth.     [provider]  rosuvastatin (CRESTOR) 5 MG tablet Take 5 mg by mouth at bedtime.    [provider]  Wheat Dextrin (BENEFIBER PO) Take 2 Scoops by mouth daily.    [provider]    Allergies    Penicillins and Other  Review of Systems   Review of Systems  Constitutional:  Positive for diaphoresis. Negative for chills and fever.  HENT:  Negative for congestion, rhinorrhea and sore throat.   Eyes:  Negative for visual disturbance.  Respiratory:  Positive for chest tightness and shortness of breath. Negative for cough.   Cardiovascular:  Positive for chest pain. Negative for palpitations and leg swelling.  Gastrointestinal:  Positive for nausea.  Negative for abdominal pain, constipation, diarrhea and vomiting.  Genitourinary:  Negative for difficulty urinating.  Musculoskeletal:  Negative for back pain.  Skin:  Negative for rash and wound.  Neurological:  Positive for headaches. Negative for dizziness, syncope, weakness and light-headedness.  All other systems reviewed and are negative.  Physical Exam Updated Vital Signs BP 130/89   Pulse 74   Temp 98 F (36.7 C)   Resp 18   Ht '5\' 6"'$  (1.676 m)   Wt 70.8 kg   LMP 08/09/2012   SpO2 100%   BMI 25.18 kg/m   Physical Exam Vitals and nursing note reviewed.  Constitutional:      General: She is not in acute distress.    Appearance: Normal appearance. She is not ill-appearing, toxic-appearing or diaphoretic.  HENT:     Head: Normocephalic and atraumatic.     Mouth/Throat:     Pharynx: No posterior oropharyngeal erythema.  Eyes:     General: No scleral icterus.       Right eye: No discharge.        Left eye: No discharge.     Conjunctiva/sclera: Conjunctivae normal.     Pupils: Pupils are equal, round, and reactive to light.  Cardiovascular:     Rate and Rhythm: Normal rate and regular rhythm.     Pulses: Normal pulses.     Heart sounds: Normal heart sounds, S1 normal and S2 normal. No murmur  heard.   No friction rub. No gallop.  Pulmonary:     Effort: Pulmonary effort is normal. No respiratory distress.     Breath sounds: Normal breath sounds. No wheezing, rhonchi or rales.  Abdominal:     General: Abdomen is flat. Bowel sounds are normal. There is no distension.     Palpations: Abdomen is soft. There is no pulsatile mass.     Tenderness: There is no abdominal tenderness. There is no guarding or rebound.  Musculoskeletal:     Right lower leg: No edema.     Left lower leg: No edema.  Skin:    General: Skin is warm and dry.     Coloration: Skin is not jaundiced.     Findings: No bruising, erythema, lesion or rash.  Neurological:     General: No focal deficit present.     Mental Status: She is alert and oriented to person, place, and time.  Psychiatric:        Mood and Affect: Mood normal.        Behavior: Behavior normal.    ED Results / Procedures / Treatments   Labs (all labs ordered are listed, but only abnormal results are displayed) Labs Reviewed  CBC - Abnormal; Notable for the following components:      Result Value   WBC 11.5 (*)    All other components within normal limits  COMPREHENSIVE METABOLIC PANEL - Abnormal; Notable for the following components:   Glucose, Bld 102 (*)    All other components within normal limits  I-STAT BETA HCG BLOOD, ED ([facility], [facility], [facility] ONLY)  TROPONIN I (HIGH SENSITIVITY)  TROPONIN I (HIGH SENSITIVITY)    EKG EKG Interpretation  Date/Time:  Wednesday February 18 2021 15:52:05 EDT Ventricular Rate:  73 PR Interval:  178 QRS Duration: 70 QT Interval:  384 QTC Calculation: 423 R Axis:   44 Text Interpretation: Sinus rhythm with marked sinus arrhythmia Otherwise normal ECG Confirmed by Octaviano Glow [phone]) on 02/18/2021 9:22:32 PM  Radiology DG Chest 2 View  Result  Date: 02/18/2021 CLINICAL DATA:  Chest pain EXAM: CHEST - 2 VIEW COMPARISON:  December 29, 2020 FINDINGS: The heart size and mediastinal contours are within normal  limits. No focal consolidation. No pleural effusion. No pneumothorax. Eventration of the right hemidiaphragm, unchanged. Thoracic spondylosis. IMPRESSION: No active cardiopulmonary disease. Electronically Signed   By: Dahlia Bailiff M.D.   On: 02/18/2021 18:06    Procedures Procedures   Medications Ordered in ED Medications - No data to display  ED Course  I have reviewed the triage vital signs and the nursing notes.  Pertinent labs & imaging results that were available during my care of the patient were reviewed by me and considered in my medical decision making (see chart for details).  Clinical Course as of 02/18/21 2340  Wed Feb 18, 2021  [address]  [GL]    Clinical Course User Index [GL] Sherre Poot Adora Fridge, PA-C   MDM Rules/Calculators/A&P HEAR Score: 5                       This is a well-appearing 55 year old female presents with 1 day of chest pain.  Stated diaphoresis, shortness of breath with exertion.  Chest pain is worse with exertion.  Is improved when laying down.  She is describes it as feeling like tightness.  She denies any upper respiratory symptoms.  He has had this in the past when she was dealing with her SVT.  Vitals are overall reassuring.  Physical exam is unremarkable.  Reviewed labs and imaging.  CMP with no electrolyte derangements, kidney dysfunction, liver dysfunction.  CBC with mild leukocytosis however her baseline white count is little bit elevated.  No anemia.  Initial troponin is negative.  Chest x-ray with no acute abnormalities.  Awaiting second troponin.  History is a little bit concerning with exertional chest pain, however cardiac workup is been totally benign and patient's recent cath suggest that her risk for a coronary event is low. Discussed these results and findings with the patient and she says that she really does not want to stay overnight unless she absolutely has to.  Since Patient does have active flutter feeling in her chest so we will  obtain a second EKG.  If no signs of SVT patient can be discharged home.  If signs of heart irregularity patient will need to be admitted for observation.  Reviewed second EKG with no signs of cardiac arrhythmia.  She can be discharged without a second troponin given that chest pain has been occurring for 48 hours now and the first troponin would have been elevated if this was due to cardiac ischemia.  Final Clinical Impression(s) / ED Diagnoses Final diagnoses:  Chest pain, unspecified type    Rx / DC Orders ED Discharge Orders     None        Adolphus Birchwood, PA-C 02/18/21 2340    Wyvonnia Dusky, MD 02/19/21 [phone]

## 2021-02-18 NOTE — ED Provider Notes (Signed)
Emergency Medicine Provider Triage Evaluation Note  Lauren [name] , a 55 y.o. female  was evaluated in triage.  Pt complains of chest pain with radiation to the back that began yesterday.  Sharp sensation.  She reports associated nausea and diaphoresis.  Review of Systems  Positive: Shortness of breath Negative: Fever, chills, cough, leg pain, leg swelling, vomiting, abdominal pain   Physical Exam  BP 137/84 (BP Location: Left Arm)   Pulse 81   Temp 98 F (36.7 C)   Resp 16   LMP 08/09/2012   SpO2 100%  Gen:   Awake, no distress   Resp:  Normal effort, clear to auscultation bilaterally MSK:   Moves extremities without difficulty  Other:  Heart sounds are irregular, no murmurs  Medical Decision Making  Medically screening exam initiated at 5:04 PM.  Appropriate orders placed.  Malachi C Mcnamara was informed that the remainder of the evaluation will be completed by another provider, this initial triage assessment does not replace that evaluation, and the importance of remaining in the ED until their evaluation is complete.     Hendricks Limes, PA-C 02/18/21 [place], [place], DO 02/18/21 2343

## 2021-02-23 DIAGNOSIS — Z87898 Personal history of other specified conditions: Secondary | ICD-10-CM | POA: Diagnosis not present

## 2021-02-23 DIAGNOSIS — Z09 Encounter for follow-up examination after completed treatment for conditions other than malignant neoplasm: Secondary | ICD-10-CM | POA: Diagnosis not present

## 2021-02-25 DIAGNOSIS — G360 Neuromyelitis optica [Devic]: Secondary | ICD-10-CM | POA: Diagnosis not present

## 2021-03-08 ENCOUNTER — Ambulatory Visit
Admission: RE | Admit: 2021-03-08 | Discharge: 2021-03-08 | Disposition: A | Discharge status: Home / Self Care | Payer: BC Managed Care – PPO | Source: Ambulatory Visit | Attending: Family Medicine | Admitting: Family Medicine

## 2021-03-08 ENCOUNTER — Other Ambulatory Visit: Payer: Self-pay

## 2021-03-08 DIAGNOSIS — N281 Cyst of kidney, acquired: Secondary | ICD-10-CM | POA: Diagnosis not present

## 2021-03-08 DIAGNOSIS — I700 Atherosclerosis of aorta: Secondary | ICD-10-CM | POA: Diagnosis not present

## 2021-03-08 DIAGNOSIS — K5730 Diverticulosis of large intestine without perforation or abscess without bleeding: Secondary | ICD-10-CM | POA: Diagnosis not present

## 2021-03-08 DIAGNOSIS — K7689 Other specified diseases of liver: Secondary | ICD-10-CM | POA: Diagnosis not present

## 2021-03-08 MED ORDER — GADOBENATE DIMEGLUMINE 529 MG/ML IV SOLN
14.0000 mL | Freq: Once | INTRAVENOUS | Status: AC | PRN
  Administered 2021-03-08: 14 mL via INTRAVENOUS

## 2021-03-11 DIAGNOSIS — G360 Neuromyelitis optica [Devic]: Secondary | ICD-10-CM | POA: Diagnosis not present

## 2021-03-19 DIAGNOSIS — N281 Cyst of kidney, acquired: Secondary | ICD-10-CM | POA: Diagnosis not present

## 2021-03-19 DIAGNOSIS — Z7185 Encounter for immunization safety counseling: Secondary | ICD-10-CM | POA: Diagnosis not present

## 2021-03-19 DIAGNOSIS — G360 Neuromyelitis optica [Devic]: Secondary | ICD-10-CM | POA: Diagnosis not present

## 2021-03-19 DIAGNOSIS — R109 Unspecified abdominal pain: Secondary | ICD-10-CM | POA: Diagnosis not present

## 2021-03-31 DIAGNOSIS — Z23 Encounter for immunization: Secondary | ICD-10-CM | POA: Diagnosis not present

## 2021-04-03 ENCOUNTER — Other Ambulatory Visit: Payer: Self-pay

## 2021-04-03 ENCOUNTER — Encounter: Payer: Self-pay | Admitting: *Deleted

## 2021-04-03 ENCOUNTER — Ambulatory Visit (INDEPENDENT_AMBULATORY_CARE_PROVIDER_SITE_OTHER): Payer: BC Managed Care – PPO | Admitting: Internal Medicine

## 2021-04-03 VITALS — BP 134/70 | HR 83 | Ht 66.0 in | Wt 164.4 lb

## 2021-04-03 DIAGNOSIS — R002 Palpitations: Secondary | ICD-10-CM

## 2021-04-03 DIAGNOSIS — I472 Ventricular tachycardia, unspecified: Secondary | ICD-10-CM

## 2021-04-03 DIAGNOSIS — I471 Supraventricular tachycardia: Secondary | ICD-10-CM | POA: Diagnosis not present

## 2021-04-03 LAB — CBC
Hematocrit: 39.7 % (ref 34.0–46.6)
Hemoglobin: 12.7 g/dL (ref 11.1–15.9)
MCH: 29.2 pg (ref 26.6–33.0)
MCHC: 32 g/dL (ref 31.5–35.7)
MCV: 91 fL (ref 79–97)
Platelets: 341 10*3/uL (ref 150–450)
RBC: 4.35 x10E6/uL (ref 3.77–5.28)
RDW: 12.4 % (ref 11.7–15.4)
WBC: 11.2 10*3/uL — ABNORMAL HIGH (ref 3.4–10.8)

## 2021-04-03 MED ORDER — DILTIAZEM HCL ER COATED BEADS 360 MG PO CP24: 360.0000 mg | ORAL_CAPSULE | Freq: Every day | ORAL | 3 refills | Status: DC

## 2021-04-03 NOTE — Patient Instructions (Addendum)
Medication Instructions:  Increase Diltiazem to 360 mg daily Your physician recommends that you continue on your current medications as directed. Please refer to the Current Medication list given to you today. *If you need a refill on your cardiac medications before your next appointment, please call your pharmacy*  Lab Work: CBC If you have labs (blood work) drawn today and your tests are completely normal, you will receive your results only by: [facility] (if you have MyChart) OR A paper copy in the mail If you have any lab test that is abnormal or we need to change your treatment, we will call you to review the results.  Testing/Procedures: Your physician has requested that you have a cardiac MRI. Cardiac MRI uses a computer to create images of your heart as its beating, producing both still and moving pictures of your heart and major blood vessels. For further information please visit [email or web address] Please follow the instruction sheet given to you today for more information.   Follow-Up: At [facility], you and your health needs are our priority.  As part of our continuing mission to provide you with exceptional heart care, we have created designated Provider Care Teams.  These Care Teams include your primary Cardiologist (physician) and Advanced Practice Providers (APPs -  Physician Assistants and Nurse Practitioners) who all work together to provide you with the care you need, when you need it.  Your physician wants you to follow-up in: 2 months with  one of the following Advanced Practice Providers on your designated Care Team:    Legrand Como "Jonni Sanger" [place], [place]    We recommend signing up for the patient portal called "MyChart".  Sign up information is provided on this After Visit Summary.  MyChart is used to connect with patients for Virtual Visits (Telemedicine).  Patients are able to view lab/test results, encounter notes, upcoming appointments, etc.  Non-urgent messages  can be sent to your provider as well.   To learn more about what you can do with MyChart, go to [email or web address].    Any Other Special Instructions Will Be Listed Below (If Applicable).          You are scheduled for Cardiac MRI on ______________. Please arrive at the [facility] main entrance of [facility] at ________________ (30-45 minutes prior to test start time). ?  [facility] [address] [place], [place] [address] [phone]  Please take advantage of the free valet parking available at the MAIN entrance (A entrance). Proceed to the [facility] Radiology Department (First Floor). ? Magnetic resonance imaging (MRI) is a painless test that produces images of the inside of the body without using Xrays.  During an MRI, strong magnets and radio waves work together in a [employer] officer, political party to form detailed images.   MRI images may provide more details about a medical condition than X-rays, CT scans, and ultrasounds can provide.  You may be given earphones to listen for instructions.  You may eat a light breakfast and take medications as ordered with the exception of HCTZ (fluid pill, other). Please avoid stimulants for 12 hr prior to test. (Ie. Caffeine, nicotine, chocolate, or antihistamine medications)  If a contrast material will be used, an IV will be inserted into one of your veins. Contrast material will be injected into your IV. It will leave your body through your urine within a day. You may be told to drink plenty of fluids to help flush the contrast material out of your system.  You will be asked to remove all metal, including: Watch, jewelry, and other metal objects including hearing aids, hair pieces and dentures. Also wearable glucose monitoring systems (ie. Freestyle Libre and Omnipods) (Braces and fillings normally are not a problem.)   TEST WILL TAKE APPROXIMATELY 1 HOUR  PLEASE NOTIFY SCHEDULING AT LEAST 24 HOURS IN ADVANCE IF YOU  ARE UNABLE TO KEEP YOUR APPOINTMENT. [phone]  Please call Marchia Bond, cardiac imaging nurse navigator with any questions/concerns. Marchia Bond RN Navigator Cardiac Imaging Gordy Clement RN Navigator Cardiac Imaging [facility] [phone] Office

## 2021-04-03 NOTE — Progress Notes (Signed)
Electrophysiology Office Note   Date:  04/03/2021   ID:  Lauren, [name] 1966-02-19, MRN [id number]  PCP:  Fanny Bien, MD    Primary Electrophysiologist: Thompson Grayer, MD    CC: tachycardia   History of Present Illness: Lauren [name] is a 55 y.o. female who presents today for electrophysiology evaluation.   She reports having episodes of "heart racing" for about a year.  She has abrupt onset/ offset of tachypalpitations, typically lasting seconds to minutes. She has worn 2 event monitors which document only sinus rhythm with PACs.  She has had rare nonsustained atrial tachycardia (first monitor) and nonsustained ventricular tachycardia (more recent monitor).  No sustained arrhythmias.  She denies presyncope or syncope.  Denies FH of sudden death or arrhythmia.  Recent cath did not show obstructive disease.  Today, she denies symptoms of palpitations, chest pain, shortness of breath, orthopnea, PND, lower extremity edema, claudication, dizziness, presyncope, syncope, bleeding, or neurologic sequela. The patient is tolerating medications without difficulties and is otherwise without complaint today.    Past Medical History:  Diagnosis Date   Allergy    seasonal   Boils 12/15/2010   multiple axilla   Carpal tunnel syndrome of left wrist    Chronic headaches 2008    improving   Constipation    Constipation 03/19/2015   DDD (degenerative disc disease), cervical    De Quervain's disease (tenosynovitis)    Right per pt   Encounter for Papanicolaou smear for cervical cancer screening 12/08/2016   Epigastric pain 09/21/2017   Heart murmur    History of Heart murmur as child.   Heart palpitations    Heartburn    Occ   HLD (hyperlipidemia) 2008   Hypertension    Menorrhagia    OA (osteoarthritis) of neck    Palpable abdominal aorta 09/21/2017   Right hip pain    Right ovarian cyst 11/16/2009   2 small, noted on pelvic US   Seizures ([place])    history of one time    Shoulder pain, right 07/2008   Ventral hernia    Weight loss 12/30/2017   Past Surgical History:  Procedure Laterality Date   COLONOSCOPY W/ POLYPECTOMY  12/24/2015   INSERTION OF MESH N/A 10/25/2017   Procedure: INSERTION OF MESH;  Surgeon: Kieth Brightly Arta Bruce, MD;  Location: [facility];  Service: General;  Laterality: N/A;   LEFT HEART CATH AND CORONARY ANGIOGRAPHY N/A 08/06/2020   Procedure: LEFT HEART CATH AND CORONARY ANGIOGRAPHY;  Surgeon: Jettie Booze, MD;  Location: [facility];  Service: Cardiovascular;  Laterality: N/A;   VENTRAL HERNIA REPAIR N/A 10/25/2017   Procedure: LAPAROSCOPIC VENTRAL HERNIA;  Surgeon: Mickeal Skinner, MD;  Location: [facility];  Service: General;  Laterality: N/A;     Current Outpatient Medications  Medication Sig Dispense Refill   diltiazem (CARDIZEM CD) 240 MG 24 hr capsule Take 1 capsule (240 mg total) by mouth daily. 90 capsule 3   diltiazem (CARDIZEM) 30 MG tablet Take 1 tablet (30 mg total) by mouth 4 (four) times daily as needed (palpitations/fast heart rate). 60 tablet 3   lisinopril (ZESTRIL) 40 MG tablet TAKE 1 TABLET BY MOUTH EVERY DAY 90 tablet 3   Multiple Vitamins-Minerals (CENTRUM SILVER 50+WOMEN PO) Take by mouth.     rosuvastatin (CRESTOR) 5 MG tablet Take 5 mg by mouth at bedtime.     Wheat Dextrin (BENEFIBER PO) Take 2 Scoops by mouth daily.  No current facility-administered medications for this visit.    Allergies:   Penicillins and Other   Social History:  The patient  reports that she has quit smoking. Her smoking use included cigarettes. She has a 10.80 pack-year smoking history. She has never used smokeless tobacco. She reports current alcohol use. She reports that she does not currently use drugs after having used the following drugs: Marijuana.   Family History:  The patient's family history includes Brain cancer in her mother; Cirrhosis in her brother; Heart disease in  her maternal grandmother; Heart failure in her father.    ROS:  Please see the history of present illness.   All other systems are personally reviewed and negative.    PHYSICAL EXAM: VS:  BP 134/70   Pulse 83   Ht 5\' 6"  (1.676 m)   Wt 164 lb 6.4 oz (74.6 kg)   LMP 08/09/2012   SpO2 99%   BMI 26.53 kg/m  , BMI Body mass index is 26.53 kg/m. GEN: Well nourished, well developed, in no acute distress HEENT: normal Neck: no JVD, carotid bruits, or masses Cardiac: RRR; no murmurs, rubs, or gallops,no edema  Respiratory:  clear to auscultation bilaterally, normal work of breathing GI: soft, nontender, nondistended, + BS MS: no deformity or atrophy Skin: warm and dry  Neuro:  Strength and sensation are intact Psych: euthymic mood, full affect  EKG:  EKG is ordered today. The ekg ordered today is personally reviewed and shows sinus rhythm   Recent Labs: 02/18/2021: ALT 23; BUN 15; Creatinine, Ser 0.65; Hemoglobin 12.6; Platelets 320; Potassium 4.2; Sodium 139  personally reviewed   Lipid Panel     Component Value Date/Time   CHOL 158 10/08/2015 1512   TRIG 78 10/08/2015 1512   HDL 41 10/08/2015 1512   CHOLHDL 3.9 10/08/2015 1512   CHOLHDL 4.3 06/01/2013 1120   VLDL 25 06/01/2013 1120   LDLCALC 101 (H) 10/08/2015 1512   personally reviewed   Wt Readings from Last 3 Encounters:  04/03/21 164 lb 6.4 oz (74.6 kg)  02/18/21 156 lb (70.8 kg)  12/31/20 156 lb 12.8 oz (71.1 kg)      Other studies personally reviewed: Additional studies/ records that were reviewed today include: prior echo, office notes,  recent ER visit, ecgs, event monitor  Review of the above records today demonstrates: as above   ASSESSMENT AND PLAN:  1.  Palpitations Appear to be due to PACs, nonsustained atrial tachycardia and nonsustained ventricular tachycardia No evidence of structural heart disease and no obstructive CAD by cath.  Ekg is low risk.  She has had some improvement with  diltiazem. I will therefore increase diltiazem to 360mg  daily today.  Given NSVT, I will order cardiac MRI to exclude structural heart disease. If MRI is low risk, could consider addition of low dose flecainide if palpitations continue  Risks, benefits and potential toxicities for medications prescribed and/or refilled reviewed with patient today.    Follow-up:  return to see EP APP in 2 months     Signed, Thompson Grayer, MD  04/03/2021 10:11 AM     [facility] [address] [place]  [place] [address] [phone] (office) [phone] (fax)

## 2021-04-14 DIAGNOSIS — D4101 Neoplasm of uncertain behavior of right kidney: Secondary | ICD-10-CM | POA: Diagnosis not present

## 2021-04-14 NOTE — Progress Notes (Signed)
Cardiology Office Note   Date:  04/15/2021   ID:  Lauren [name], DOB [birth year], MRN [id number]  PCP:  Fanny Bien, MD    No chief complaint on file.  Chest pain  Wt Readings from Last 3 Encounters:  04/15/21 163 lb (73.9 kg)  04/03/21 164 lb 6.4 oz (74.6 kg)  02/18/21 156 lb (70.8 kg)       History of Present Illness: Lauren [name] is a 55 y.o. female  who has had prior chest pain.  Prior results show: "02/2019 Echo showed normal LV and valvular function.    Eval at [facility] in 04/2019 showed: "The Zio patch reading was notable for 50 runs of nonsustained SVT, the fastest of which was approximately 18 beats with a heart rate of 169 bpm. It should be noted, though, that the patient never triggered her monitor during any of these 50 episodes of nonsustained SVT. She only reported symptoms occasionally with some SV ectopy, isolated or couplets, as well as during periods of normal sinus rhythm. It should be noted though that the patient states that the symptoms she recorded while wearing the ZIO Patch were the same ones that prompted her to go to the [facility]. The echocardiogram was notable for an LVEF of 60 to 65%, with normal left ventricular function and size. There was, however, impaired relaxation pattern of LV diastolic filling noted."  CT scan in 07/2020 showed: "Coronary calcium score: The patient's coronary artery calcium score is 395, which places the patient in the 99th percentile."  Cath in 07/2020 showed: "Prox LAD to Mid LAD lesion is 25% stenosed. Mid Cx lesion is 25% stenosed. Mid LAD lesion is 25% stenosed. The left ventricular systolic function is normal. LV end diastolic pressure is normal. The left ventricular ejection fraction is 55-65% by visual estimate. There is no aortic valve stenosis.   Mild, nonobstructive CAD.  Continue medical therapy and risk factor modification."  In 03/2021, she saw Dr. Rayann Heman: "Diltiazem increased.   CMRI planned to see if flecainide can be used."  Denies :  Dizziness. Leg edema. Nitroglycerin use. Orthopnea.  Paroxysmal nocturnal dyspnea. Shortness of breath. Syncope.   PACs have decreased with increased Dilt.    Strenuous job at [employer].  THis is her biggest form of exercise.   Past Medical History:  Diagnosis Date   Allergy    seasonal   Boils 12/15/2010   multiple axilla   Carpal tunnel syndrome of left wrist    Chronic headaches 2008    improving   Constipation    Constipation 03/19/2015   DDD (degenerative disc disease), cervical    De Quervain's disease (tenosynovitis)    Right per pt   Encounter for Papanicolaou smear for cervical cancer screening 12/08/2016   Epigastric pain 09/21/2017   Heart murmur    History of Heart murmur as child.   Heart palpitations    Heartburn    Occ   HLD (hyperlipidemia) 2008   Hypertension    Menorrhagia    OA (osteoarthritis) of neck    Palpable abdominal aorta 09/21/2017   Right hip pain    Right ovarian cyst 11/16/2009   2 small, noted on pelvic US   Seizures ([place])    history of one time   Shoulder pain, right 07/2008   Ventral hernia    Weight loss 12/30/2017    Past Surgical History:  Procedure Laterality Date   COLONOSCOPY W/ POLYPECTOMY  12/24/2015  INSERTION OF MESH N/A 10/25/2017   Procedure: INSERTION OF MESH;  Surgeon: Kinsinger, Arta Bruce, MD;  Location: [facility];  Service: General;  Laterality: N/A;   LEFT HEART CATH AND CORONARY ANGIOGRAPHY N/A 08/06/2020   Procedure: LEFT HEART CATH AND CORONARY ANGIOGRAPHY;  Surgeon: Jettie Booze, MD;  Location: [place] [facility];  Service: Cardiovascular;  Laterality: N/A;   VENTRAL HERNIA REPAIR N/A 10/25/2017   Procedure: LAPAROSCOPIC VENTRAL HERNIA;  Surgeon: Mickeal Skinner, MD;  Location: [place];  Service: General;  Laterality: N/A;     Current Outpatient Medications  Medication Sig Dispense Refill   diltiazem  (CARDIZEM CD) 360 MG 24 hr capsule Take 1 capsule (360 mg total) by mouth daily. 90 capsule 3   diltiazem (CARDIZEM) 30 MG tablet Take 1 tablet (30 mg total) by mouth 4 (four) times daily as needed (palpitations/fast heart rate). 60 tablet 3   lisinopril (ZESTRIL) 40 MG tablet TAKE 1 TABLET BY MOUTH EVERY DAY 90 tablet 3   Multiple Vitamins-Minerals (CENTRUM SILVER 50+WOMEN PO) Take by mouth.     rosuvastatin (CRESTOR) 5 MG tablet Take 5 mg by mouth at bedtime.     Wheat Dextrin (BENEFIBER PO) Take 2 Scoops by mouth daily.     No current facility-administered medications for this visit.    Allergies:   Penicillins and Other    Social History:  The patient  reports that she has quit smoking. Her smoking use included cigarettes. She has a 10.80 pack-year smoking history. She has never used smokeless tobacco. She reports current alcohol use. She reports that she does not currently use drugs after having used the following drugs: Marijuana.   Family History:  The patient's family history includes Brain cancer in her mother; Cirrhosis in her brother; Heart disease in her maternal grandmother; Heart failure in her father.    ROS:  Please see the history of present illness.   Otherwise, review of systems are positive for palpitations- feels like a fast beat.   All other systems are reviewed and negative.    PHYSICAL EXAM: VS:  BP 110/64   Pulse 86   Ht 5\' 6"  (1.676 m)   Wt 163 lb (73.9 kg)   LMP 08/09/2012   SpO2 98%   BMI 26.31 kg/m  , BMI Body mass index is 26.31 kg/m. GEN: Well nourished, well developed, in no acute distress HEENT: normal Neck: no JVD, carotid bruits, or masses Cardiac: RRR; no murmurs, rubs, or gallops,no edema  Respiratory:  clear to auscultation bilaterally, normal work of breathing GI: soft, nontender, nondistended, + BS MS: no deformity or atrophy Skin: warm and dry, no rash Neuro:  Strength and sensation are intact Psych: euthymic mood, full affect   EKG:    The ekg ordered in 03/2021 demonstrates sinus rhythm   Recent Labs: 02/18/2021: ALT 23; BUN 15; Creatinine, Ser 0.65; Potassium 4.2; Sodium 139 04/03/2021: Hemoglobin 12.7; Platelets 341   Lipid Panel    Component Value Date/Time   CHOL 158 10/08/2015 1512   TRIG 78 10/08/2015 1512   HDL 41 10/08/2015 1512   CHOLHDL 3.9 10/08/2015 1512   CHOLHDL 4.3 06/01/2013 1120   VLDL 25 06/01/2013 1120   LDLCALC 101 (H) 10/08/2015 1512     Other studies Reviewed: Additional studies/ records that were reviewed today with results demonstrating: labs reviewed.   ASSESSMENT AND PLAN:  CAD: Mild by cath in 2022.  No exertional angina. COntinue healthy lifestyle.  Eating  lots of fiber, plant-based diet.  Avoiding processed foods.  Avoiding fried foods. HTN: The current medical regimen is effective;  continue present plan and medications. Hyperlipidemia: COntinue rosuvastatin.  LDL 109 in Jan 2022.  Recheck in Jan 2023. Prior Tobacco abuse: Quit smoking in 2021.  No cravings recently.  PACs: Diltiazem increased- sx improved.  Not needing prn Dilt 30 mg.  CMRI planned to see if flecainide can be used.     Current medicines are reviewed at length with the patient today.  The patient concerns regarding her medicines were addressed.  The following changes have been made:  No change  Labs/ tests ordered today include:  No orders of the defined types were placed in this encounter.   Recommend 150 minutes/week of aerobic exercise Low fat, low carb, high fiber diet recommended  Disposition:   FU in 1 year   Signed, Larae Grooms, MD  04/15/2021 12:15 PM    [facility] [address], [place], [place]  [address] Phone: [phone]; Fax: [phone]

## 2021-04-15 ENCOUNTER — Ambulatory Visit (INDEPENDENT_AMBULATORY_CARE_PROVIDER_SITE_OTHER): Payer: BC Managed Care – PPO | Admitting: Interventional Cardiology

## 2021-04-15 ENCOUNTER — Encounter: Payer: Self-pay | Admitting: Interventional Cardiology

## 2021-04-15 ENCOUNTER — Other Ambulatory Visit: Payer: Self-pay

## 2021-04-15 VITALS — BP 110/64 | HR 86 | Ht 66.0 in | Wt 163.0 lb

## 2021-04-15 DIAGNOSIS — I10 Essential (primary) hypertension: Secondary | ICD-10-CM | POA: Diagnosis not present

## 2021-04-15 DIAGNOSIS — E782 Mixed hyperlipidemia: Secondary | ICD-10-CM

## 2021-04-15 DIAGNOSIS — I2510 Atherosclerotic heart disease of native coronary artery without angina pectoris: Secondary | ICD-10-CM

## 2021-04-15 DIAGNOSIS — I491 Atrial premature depolarization: Secondary | ICD-10-CM | POA: Diagnosis not present

## 2021-04-15 NOTE — Patient Instructions (Signed)
Medication Instructions:  Your physician recommends that you continue on your current medications as directed. Please refer to the Current Medication list given to you today.  *If you need a refill on your cardiac medications before your next appointment, please call your pharmacy*   Lab Work: None If you have labs (blood work) drawn today and your tests are completely normal, you will receive your results only by: [place] (if you have MyChart) OR A paper copy in the mail If you have any lab test that is abnormal or we need to change your treatment, we will call you to review the results.   Follow-Up: At [facility], you and your health needs are our priority.  As part of our continuing mission to provide you with exceptional heart care, we have created designated Provider Care Teams.  These Care Teams include your primary Cardiologist (physician) and Advanced Practice Providers (APPs -  Physician Assistants and Nurse Practitioners) who all work together to provide you with the care you need, when you need it.   Your next appointment:   06/01/21 at 9:00 AM  The format for your next appointment:   In Person  Provider:   Shirley Friar, PA-C  Follow up as needed with Dr Irish Lack.

## 2021-04-30 ENCOUNTER — Other Ambulatory Visit: Payer: Self-pay | Admitting: Obstetrics and Gynecology

## 2021-04-30 ENCOUNTER — Other Ambulatory Visit: Payer: Self-pay | Admitting: Family Medicine

## 2021-04-30 DIAGNOSIS — Z1231 Encounter for screening mammogram for malignant neoplasm of breast: Secondary | ICD-10-CM

## 2021-05-05 ENCOUNTER — Telehealth (HOSPITAL_COMMUNITY): Payer: Self-pay | Admitting: Emergency Medicine

## 2021-05-05 NOTE — Telephone Encounter (Signed)
Attempted to call patient regarding upcoming cardiac MR appointment. Left message on voicemail with name and callback number Nakyah Erdmann RN Navigator Cardiac Imaging [facility] [phone] Office [phone] Cell  

## 2021-05-06 ENCOUNTER — Ambulatory Visit (HOSPITAL_COMMUNITY)
Admission: RE | Admit: 2021-05-06 | Discharge: 2021-05-06 | Disposition: A | Discharge status: Home / Self Care | Payer: BC Managed Care – PPO | Source: Ambulatory Visit | Attending: Internal Medicine | Admitting: Internal Medicine

## 2021-05-06 ENCOUNTER — Other Ambulatory Visit: Payer: Self-pay

## 2021-05-06 DIAGNOSIS — I471 Supraventricular tachycardia: Secondary | ICD-10-CM | POA: Diagnosis not present

## 2021-05-06 DIAGNOSIS — R002 Palpitations: Secondary | ICD-10-CM | POA: Insufficient documentation

## 2021-05-06 DIAGNOSIS — I472 Ventricular tachycardia, unspecified: Secondary | ICD-10-CM | POA: Diagnosis not present

## 2021-05-06 MED ORDER — GADOBUTROL 1 MMOL/ML IV SOLN
8.0000 mL | Freq: Once | INTRAVENOUS | Status: AC | PRN
  Administered 2021-05-06: 8 mL via INTRAVENOUS

## 2021-05-22 DIAGNOSIS — E559 Vitamin D deficiency, unspecified: Secondary | ICD-10-CM | POA: Diagnosis not present

## 2021-05-28 DIAGNOSIS — I471 Supraventricular tachycardia: Secondary | ICD-10-CM | POA: Diagnosis not present

## 2021-05-28 DIAGNOSIS — I10 Essential (primary) hypertension: Secondary | ICD-10-CM | POA: Diagnosis not present

## 2021-05-28 DIAGNOSIS — E559 Vitamin D deficiency, unspecified: Secondary | ICD-10-CM | POA: Diagnosis not present

## 2021-05-28 DIAGNOSIS — R002 Palpitations: Secondary | ICD-10-CM | POA: Diagnosis not present

## 2021-05-29 NOTE — Progress Notes (Signed)
PCP:  Fanny Bien, MD Primary Cardiologist: Larae Grooms, MD Electrophysiologist: Thompson Grayer, MD   Lauren [name] is a 55 y.o. female seen today for Thompson Grayer, MD for routine electrophysiology followup.  Since last being seen in our clinic the patient reports doing very well. She has not needed to use any of her prn diltiazem since the increase in the long acting dose.  she denies chest pain, dyspnea, PND, orthopnea, nausea, vomiting, dizziness, syncope, edema, weight gain, or early satiety.  Past Medical History:  Diagnosis Date   Allergy    seasonal   Boils 12/15/2010   multiple axilla   Carpal tunnel syndrome of left wrist    Chronic headaches 2008    improving   Constipation    Constipation 03/19/2015   DDD (degenerative disc disease), cervical    De Quervain's disease (tenosynovitis)    Right per pt   Encounter for Papanicolaou smear for cervical cancer screening 12/08/2016   Epigastric pain 09/21/2017   Heart murmur    History of Heart murmur as child.   Heart palpitations    Heartburn    Occ   HLD (hyperlipidemia) 2008   Hypertension    Menorrhagia    OA (osteoarthritis) of neck    Palpable abdominal aorta 09/21/2017   Right hip pain    Right ovarian cyst 11/16/2009   2 small, noted on pelvic US   Seizures ([place])    history of one time   Shoulder pain, right 07/2008   Ventral hernia    Weight loss 12/30/2017   Past Surgical History:  Procedure Laterality Date   COLONOSCOPY W/ POLYPECTOMY  12/24/2015   INSERTION OF MESH N/A 10/25/2017   Procedure: INSERTION OF MESH;  Surgeon: Kieth Brightly Arta Bruce, MD;  Location: [facility];  Service: General;  Laterality: N/A;   LEFT HEART CATH AND CORONARY ANGIOGRAPHY N/A 08/06/2020   Procedure: LEFT HEART CATH AND CORONARY ANGIOGRAPHY;  Surgeon: Jettie Booze, MD;  Location: [facility];  Service: Cardiovascular;  Laterality: N/A;   VENTRAL HERNIA REPAIR N/A 10/25/2017   Procedure:  LAPAROSCOPIC VENTRAL HERNIA;  Surgeon: Mickeal Skinner, MD;  Location: [place];  Service: General;  Laterality: N/A;    Current Outpatient Medications  Medication Sig Dispense Refill   diltiazem (CARDIZEM CD) 360 MG 24 hr capsule Take 1 capsule (360 mg total) by mouth daily. 90 capsule 3   diltiazem (CARDIZEM) 30 MG tablet Take 1 tablet (30 mg total) by mouth 4 (four) times daily as needed (palpitations/fast heart rate). 60 tablet 3   lisinopril (ZESTRIL) 40 MG tablet TAKE 1 TABLET BY MOUTH EVERY DAY 90 tablet 3   Multiple Vitamins-Minerals (CENTRUM SILVER 50+WOMEN PO) Take by mouth.     rosuvastatin (CRESTOR) 5 MG tablet Take 5 mg by mouth at bedtime.     Wheat Dextrin (BENEFIBER PO) Take 2 Scoops by mouth daily.     No current facility-administered medications for this visit.    Allergies  Allergen Reactions   Penicillins Anaphylaxis, Hives and Swelling   Other Other (See Comments) and Swelling    Banana - Swelling and hives. Banana - Swelling and hives.    Social History   Socioeconomic History   Marital status: Soil scientist    Spouse name: Not on file   Number of children: Not on file   Years of education: Not on file   Highest education level: Not on file  Occupational History  Not on file  Tobacco Use   Smoking status: Former    Packs/day: 0.30    Years: 36.00    Pack years: 10.80    Types: Cigarettes   Smokeless tobacco: Never   Tobacco comments:    1/2 pack per day  Vaping Use   Vaping Use: Never used  Substance and Sexual Activity   Alcohol use: Yes    Alcohol/week: 0.0 standard drinks    Comment: seldom   Drug use: Not Currently    Types: Marijuana    Comment: 2-3 times per week for pain   Sexual activity: Yes    Birth control/protection: Post-menopausal  Other Topics Concern   Not on file  Social History Narrative   Works as Investment banker, corporate at [employer], lives with significant other in [place]   No EToh,  former beer drinker   No illicit drug use.   Current smoker, 1/2 PPDx 26 years   Caffeine use: none   Right handed    Social Determinants of Health   Financial Resource Strain: Not on file  Food Insecurity: Not on file  Transportation Needs: Not on file  Physical Activity: Not on file  Stress: Not on file  Social Connections: Not on file  Intimate Partner Violence: Not on file     Review of Systems: All other systems reviewed and are otherwise negative except as noted above.  Physical Exam: Vitals:   06/01/21 0841  BP: 118/72  Pulse: 82  SpO2: 98%  Weight: 163 lb (73.9 kg)  Height: 5\' 6"  (1.676 m)    GEN- The patient is well appearing, alert and oriented x 3 today.   HEENT: normocephalic, atraumatic; sclera clear, conjunctiva pink; hearing intact; oropharynx clear; neck supple, no JVP Lymph- no cervical lymphadenopathy Lungs- Clear to ausculation bilaterally, normal work of breathing.  No wheezes, rales, rhonchi Heart- Regular rate and rhythm, no murmurs, rubs or gallops, PMI not laterally displaced GI- soft, non-tender, non-distended, bowel sounds present, no hepatosplenomegaly Extremities- no clubbing, cyanosis, or edema; DP/PT/radial pulses 2+ bilaterally MS- no significant deformity or atrophy Skin- warm and dry, no rash or lesion Psych- euthymic mood, full affect Neuro- strength and sensation are intact  EKG is not ordered.  Additional studies reviewed include: Previous EP and gen cards notes.   Assessment and Plan:  1.  Palpitations cMRI 11/23 with normal LV/RV, no LGE, no explanation for ventricular arrhythmias. Appear to be due to PACs, nonsustained atrial tachycardia and nonsustained ventricular tachycardia No evidence of structural heart disease and no obstructive CAD by cath.  Ekg is low risk. She overall has had improvement on diltiazem 360 mg daily.  With low risk MRI, can consider addition of low dose flecainide if palpitations continue  Follow  up with  EP APP  in 6 months. Consider follow up with Dr. Curt Bears or Dr. Quentin Ore sooner need to consider alternate AAD. Low utility to consider ablation at this point with multiple sites as above potentially causing her symptoms.   Shirley Friar, PA-C  06/01/21 9:00 AM

## 2021-06-01 ENCOUNTER — Ambulatory Visit (INDEPENDENT_AMBULATORY_CARE_PROVIDER_SITE_OTHER): Payer: BC Managed Care – PPO | Admitting: Student

## 2021-06-01 ENCOUNTER — Encounter: Payer: Self-pay | Admitting: Student

## 2021-06-01 ENCOUNTER — Other Ambulatory Visit: Payer: Self-pay

## 2021-06-01 VITALS — BP 118/72 | HR 82 | Ht 66.0 in | Wt 163.0 lb

## 2021-06-01 DIAGNOSIS — R002 Palpitations: Secondary | ICD-10-CM | POA: Diagnosis not present

## 2021-06-01 NOTE — Patient Instructions (Signed)

## 2021-06-14 DIAGNOSIS — I471 Supraventricular tachycardia: Secondary | ICD-10-CM | POA: Diagnosis not present

## 2021-06-14 DIAGNOSIS — I2510 Atherosclerotic heart disease of native coronary artery without angina pectoris: Secondary | ICD-10-CM | POA: Diagnosis not present

## 2021-06-14 DIAGNOSIS — I10 Essential (primary) hypertension: Secondary | ICD-10-CM | POA: Diagnosis not present

## 2021-06-14 DIAGNOSIS — U071 COVID-19: Secondary | ICD-10-CM | POA: Diagnosis not present

## 2021-06-16 ENCOUNTER — Ambulatory Visit: Payer: BC Managed Care – PPO

## 2021-07-03 ENCOUNTER — Ambulatory Visit
Admission: RE | Admit: 2021-07-03 | Discharge: 2021-07-03 | Disposition: A | Discharge status: Home / Self Care | Payer: BC Managed Care – PPO | Source: Ambulatory Visit | Attending: Family Medicine | Admitting: Family Medicine

## 2021-07-03 DIAGNOSIS — Z1231 Encounter for screening mammogram for malignant neoplasm of breast: Secondary | ICD-10-CM | POA: Diagnosis not present

## 2021-07-15 ENCOUNTER — Telehealth: Payer: Self-pay | Admitting: *Deleted

## 2021-07-15 NOTE — Telephone Encounter (Signed)
FYI Pt called back and sched a 6 mo f/u for 03-07 and is on wait list.  Her f/u is with Amy, NP and pt confirmed no new issues.  This again is FYI no call back requested.

## 2021-07-15 NOTE — Telephone Encounter (Signed)
Received the following message from infusion suite: "Just an Lauren [name] 11/09/2065 hasn't been see by Dr Felecia Shelling since 07/28/20. She has an infusion coming up on 3/1 & 3/15. I don't see an appt in EPIC for the next 90 days."  Called and LVM at [phone]. Asked pt to call office to set up appt. Needs to occur prior to next infusion. She is past due for f/u (was supposed to have follow up in August 2022). Last appt 07/28/20.

## 2021-07-16 NOTE — Telephone Encounter (Signed)
Called pt. Offered sooner appt tomorrow w/ AL,NP at 10am. She already had another obligation, could not accept. Scheduled appt w/ Dr. Felecia Shelling 07/20/21 at 11am. Asked she check in by 1030a, bring insurance cards and updated med list. She verbalized understanding.

## 2021-07-17 DIAGNOSIS — G8929 Other chronic pain: Secondary | ICD-10-CM | POA: Diagnosis not present

## 2021-07-17 DIAGNOSIS — M25561 Pain in right knee: Secondary | ICD-10-CM | POA: Diagnosis not present

## 2021-07-20 ENCOUNTER — Ambulatory Visit (INDEPENDENT_AMBULATORY_CARE_PROVIDER_SITE_OTHER): Payer: BC Managed Care – PPO | Admitting: Neurology

## 2021-07-20 ENCOUNTER — Encounter: Payer: Self-pay | Admitting: Neurology

## 2021-07-20 VITALS — BP 114/70 | HR 76 | Ht 66.0 in | Wt 158.5 lb

## 2021-07-20 DIAGNOSIS — E559 Vitamin D deficiency, unspecified: Secondary | ICD-10-CM

## 2021-07-20 DIAGNOSIS — G378 Other specified demyelinating diseases of central nervous system: Secondary | ICD-10-CM | POA: Diagnosis not present

## 2021-07-20 DIAGNOSIS — G373 Acute transverse myelitis in demyelinating disease of central nervous system: Secondary | ICD-10-CM | POA: Diagnosis not present

## 2021-07-20 DIAGNOSIS — Z8669 Personal history of other diseases of the nervous system and sense organs: Secondary | ICD-10-CM | POA: Diagnosis not present

## 2021-07-20 DIAGNOSIS — Z79899 Other long term (current) drug therapy: Secondary | ICD-10-CM

## 2021-07-20 NOTE — Progress Notes (Signed)
[facility]  PATIENT: Lauren [name] DOB: [birth year]  REFERRING DOCTOR OR PCP:  Rachell Cipro SOURCE:   _________________________________   HISTORICAL  CHIEF COMPLAINT:  Chief Complaint  Patient presents with   Follow-up    Rm 2, alone. Here to f/u for neuromyelitis optica, pt on Rituxan. next infusion scheduled for 08/12/21 and 08/26/21. Pt reports doing well. Pt reports having issues with her taste in food. There's time where she cant taste the food that are suppose to be sour or spicy.     HISTORY OF PRESENT ILLNESS:  Lauren [name], at the [facility] at [facility] for neurologic consultation regarding her transverse myelitis and recent diagnosis of anti-MOG positive neuromyelitis optica.  07/30/2021: She has been on Rituximab and her next infusion is 08/12/2021.      She tolerates it well.   MOGAD is doing well - no exacerbations  Gait is doing well.   She walks at work the entire night (third shift at [employer] as [employer]).   Balance is near baseline.  She denies any new weakness.   She denies any change in bladder functions.  She denies any numbness now.  She does note some knee pain on the right.     Bladder function is fine.     Fatigue is not much of a problem.    She sleeps well most days (third shift work).     Cognition is fine.    Vision is back to baseline and symmetric colors.    She saw ophtometry Engineer, building services on [place]).  Dr. Marin Comment shortly after the initial visit with me last year.  NMOSD History: She had transverse myelitis 2 days after receiving the first Vander vaccination 09/18/2019.   On 09/20/2019, her left leg went numb.  Over the next 2 days, the right leg started to get numb and the left leg numbness worsen and she became weaker.   She was able to walk though she felt she had a limp.  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  She did not have any numbness or weakness in the arms.  Because  symptoms persisted, she went to Urgent care 09/28/19 and had standard labs that were fine.  She was prescribed 20 mg prednisone x 5 days and advised to call back if not better.  Symptoms persisted and she was referred to Dr. Manuella Ghazi..  He saw her 10/15/19 and then sent her that day to [facility] due to Brown-Sequard like symptoms.   She was seen in the emergency room and then admitted.  She was treated with IV Solumedrol for 5 days.   She had a lumbar puncture and her CSF showed oligoclonal bands (9).  Additionally, she had testing for neuromyelitis optica and is anti-MOG positive.  She is anti-NMO negative.   The anti-Mog antibody test had not returned yet when she was discharged.  She followed up with Dr. Manuella Ghazi and is referred for possible DMT therapy.    She received 5 days IV Solumedrol with improvement.   She had the onset of right eye pain about a week ago, worse with movements and over the next couple days she lost vision in both eyes, right worse than left, consistent with optic neuritis..  At the visit, 01/10/2020, she had perception of shapes but could not count fingers in the majority of the visual field and was able to count fingers in the upper nasal quadrant.  On the  left, she had mildly reduced vision but could read.  She received 4 days of IV Solu-Medrol and vision improved over the next couple weeks and has continued to improve towards baseline.  Imaging: MRI of the cervical and thoracic spine 10/16/2019 showed an enhancing lesion to the left of midline from T10 to T11.  The cervical spine was normal.  MRI of the brain showed some nonspecific white matter foci in the subcortical and deep white matter but no acute findings.  Laboratory test: 01/10/2020: Hepatitis B core and surface antibodies were negative 10/16/2019: CSF showed 9 oligoclonal bands unique to the CSF. 10/17/2019: Anti-NMO was negative and anti-MOG was positive 1:100 ([facility]).  ESR and ANA were negative.  CCP IgG/IgAwas negative.  Quant TB  negative    REVIEW OF SYSTEMS: Constitutional: No fevers, chills, sweats, or change in appetite Eyes: No visual changes, double vision, eye pain Ear, nose and throat: No hearing loss, ear pain, nasal congestion, sore throat Cardiovascular: No chest pain, palpitations Respiratory:  No shortness of breath at rest or with exertion.   No wheezes GastrointestinaI: No nausea, vomiting, diarrhea, abdominal pain, fecal incontinence Genitourinary:  No dysuria, urinary retention or frequency.  No nocturia. Musculoskeletal:  No neck pain, back pain Integumentary: No rash, pruritus, skin lesions Neurological: as above Psychiatric: No depression at this time.  No anxiety Endocrine: No palpitations, diaphoresis, change in appetite, change in weigh or increased thirst Hematologic/Lymphatic:  No anemia, purpura, petechiae. Allergic/Immunologic: No itchy/runny eyes, nasal congestion, recent allergic reactions, rashes  ALLERGIES: Allergies  Allergen Reactions   Penicillins Anaphylaxis, Hives and Swelling   Other Other (See Comments) and Swelling    Banana - Swelling and hives. Banana - Swelling and hives.    HOME MEDICATIONS:  Current Outpatient Medications:    diltiazem (CARDIZEM CD) 360 MG 24 hr capsule, Take 1 capsule (360 mg total) by mouth daily., Disp: 90 capsule, Rfl: 3   diltiazem (CARDIZEM) 30 MG tablet, Take 1 tablet (30 mg total) by mouth 4 (four) times daily as needed (palpitations/fast heart rate)., Disp: 60 tablet, Rfl: 3   lisinopril (ZESTRIL) 40 MG tablet, TAKE 1 TABLET BY MOUTH EVERY DAY, Disp: 90 tablet, Rfl: 3   Multiple Vitamins-Minerals (CENTRUM SILVER 50+WOMEN PO), Take by mouth., Disp: , Rfl:    rosuvastatin (CRESTOR) 5 MG tablet, Take 5 mg by mouth at bedtime., Disp: , Rfl:    Wheat Dextrin (BENEFIBER PO), Take 2 Scoops by mouth daily., Disp: , Rfl:   PAST MEDICAL HISTORY: Past Medical History:  Diagnosis Date   Allergy    seasonal   Boils 12/15/2010   multiple  axilla   Carpal tunnel syndrome of left wrist    Chronic headaches 2008    improving   Constipation    Constipation 03/19/2015   DDD (degenerative disc disease), cervical    De Quervain's disease (tenosynovitis)    Right per pt   Encounter for Papanicolaou smear for cervical cancer screening 12/08/2016   Epigastric pain 09/21/2017   Heart murmur    History of Heart murmur as child.   Heart palpitations    Heartburn    Occ   HLD (hyperlipidemia) 2008   Hypertension    Menorrhagia    OA (osteoarthritis) of neck    Palpable abdominal aorta 09/21/2017   Right hip pain    Right ovarian cyst 11/16/2009   2 small, noted on pelvic US   Seizures ([place])    history of one time   Shoulder pain,  right 07/2008   Ventral hernia    Weight loss 12/30/2017    PAST SURGICAL HISTORY: Past Surgical History:  Procedure Laterality Date   COLONOSCOPY W/ POLYPECTOMY  12/24/2015   INSERTION OF MESH N/A 10/25/2017   Procedure: INSERTION OF MESH;  Surgeon: Kinsinger, Arta Bruce, MD;  Location: [facility];  Service: General;  Laterality: N/A;   LEFT HEART CATH AND CORONARY ANGIOGRAPHY N/A 08/06/2020   Procedure: LEFT HEART CATH AND CORONARY ANGIOGRAPHY;  Surgeon: Jettie Booze, MD;  Location: [facility];  Service: Cardiovascular;  Laterality: N/A;   VENTRAL HERNIA REPAIR N/A 10/25/2017   Procedure: LAPAROSCOPIC VENTRAL HERNIA;  Surgeon: Kieth Brightly, Arta Bruce, MD;  Location: [place];  Service: General;  Laterality: N/A;    FAMILY HISTORY: Family History  Problem Relation Age of Onset   Brain cancer Mother    Heart failure Father    Cirrhosis Brother        alcohol related   Heart disease Maternal Grandmother    Breast cancer Neg Hx    Colon cancer Neg Hx    Esophageal cancer Neg Hx    Stomach cancer Neg Hx     SOCIAL HISTORY:  Social History   Socioeconomic History   Marital status: Soil scientist    Spouse name: Not on file   Number of  children: Not on file   Years of education: Not on file   Highest education level: Not on file  Occupational History   Not on file  Tobacco Use   Smoking status: Former    Packs/day: 0.30    Years: 36.00    Pack years: 10.80    Types: Cigarettes   Smokeless tobacco: Never   Tobacco comments:    1/2 pack per day  Vaping Use   Vaping Use: Never used  Substance and Sexual Activity   Alcohol use: Yes    Alcohol/week: 0.0 standard drinks    Comment: seldom   Drug use: Not Currently    Types: Marijuana    Comment: 2-3 times per week for pain   Sexual activity: Yes    Birth control/protection: Post-menopausal  Other Topics Concern   Not on file  Social History Narrative   Works as Investment banker, corporate at [employer], lives with significant other in [place]   No EToh, former beer drinker   No illicit drug use.   Current smoker, 1/2 PPDx 26 years   Caffeine use: none   Right handed    Social Determinants of Health   Financial Resource Strain: Not on file  Food Insecurity: Not on file  Transportation Needs: Not on file  Physical Activity: Not on file  Stress: Not on file  Social Connections: Not on file  Intimate Partner Violence: Not on file     PHYSICAL EXAM  Vitals:   07/20/21 1038  BP: 114/70  Pulse: 76  SpO2: 97%  Weight: 158 lb 8 oz (71.9 kg)  Height: _0  (1.676 m)    Body mass index is 25.58 kg/m.  VA 20/30 OD VA 20/50 OS  General: The patient is well-developed and well-nourished and in no acute distress  HEENT:  Head is [place]/AT.  Sclera are anicteri  Skin: Extremities are without rash or  edema.   Neurologic Exam  Mental status: The patient is alert and oriented x 3 at the time of the examination. The patient has apparent normal recent and remote memory, with an apparently normal  attention span and concentration ability.   Speech is normal.  Cranial nerves: Extraocular movements are full.  She has mild ptosis, right greater than left.   Color vision was symmetric.   Visual fields now FTC.  Facial strength and sensation were fine.   No obvious hearing deficits are noted.  Motor:  Muscle bulk is normal.   Tone is Normal.  Strength is 5/5 now.    Sensory: Sensory testing is intact to pinprick, soft touch and vibration sensation in the arms and legs  Coordination: Cerebellar testing reveals good finger-nose-finger and heel-to-shin bilaterally   Gait and station: Station is normal.   She has normal gait and mildly wide tandem gait..     Reflexes: Deep tendon reflexes are symmetric and normal bilaterally.       DIAGNOSTIC DATA (LABS, IMAGING, TESTING) - I reviewed patient records, labs, notes, testing and imaging myself where available.  Lab Results  Component Value Date   WBC 11.2 (H) 04/03/2021   HGB 12.7 04/03/2021   HCT 39.7 04/03/2021   MCV 91 04/03/2021   PLT 341 04/03/2021      Component Value Date/Time   NA 139 02/18/2021 1708   NA 140 07/29/2020 1016   K 4.2 02/18/2021 1708   CL 107 02/18/2021 1708   CO2 23 02/18/2021 1708   GLUCOSE 102 (H) 02/18/2021 1708   BUN 15 02/18/2021 1708   BUN 10 07/29/2020 1016   CREATININE 0.65 02/18/2021 1708   CREATININE 0.76 06/01/2013 1120   CALCIUM 9.3 02/18/2021 1708   PROT 7.2 02/18/2021 1708   PROT 7.8 12/28/2017 1614   ALBUMIN 4.0 02/18/2021 1708   ALBUMIN 4.3 12/28/2017 1614   AST 27 02/18/2021 1708   ALT 23 02/18/2021 1708   ALKPHOS 53 02/18/2021 1708   BILITOT 0.6 02/18/2021 1708   BILITOT 0.2 12/28/2017 1614   GFRNONAA >60 02/18/2021 1708   GFRNONAA >89 06/01/2013 1120   GFRAA 108 07/29/2020 1016   GFRAA >89 06/01/2013 1120   Lab Results  Component Value Date   CHOL 158 10/08/2015   HDL 41 10/08/2015   LDLCALC 101 (H) 10/08/2015   TRIG 78 10/08/2015   CHOLHDL 3.9 10/08/2015   No results found for: HGBA1C Lab Results  Component Value Date   VITAMINB12 >2000 (H) 07/29/2020   Lab Results  Component Value Date   TSH 0.763 09/28/2019        ASSESSMENT AND PLAN  MOG antibody disease (HCC)  Transverse myelitis (HCC)  History of optic neuritis  High risk medication use   1.  She has recovered well from her transverse myelitis and optic neuritis   She has anti-MOG antibodies c/w MOGAD variant of Devic's disease. .    Interestingly, she does have oligoclonal bands.  This was seen in about 13% of adults (Jarius 2020), usually doing a relapse, in 1 study of anti-MOG NMOSD.  She has done very well on rituximab and should continue.    2.  Check IgG/IgM, CBC with differential and CD19/CD20 3.  Return in 6 months.    Will need IgG/IgM and CBC/D at that time   Deaaron Fulghum A. Felecia Shelling, MD, [facility] 06/17/4313, 40:08 AM Certified in Neurology, Clinical Neurophysiology, Sleep Medicine and Neuroimaging  [facility] [address] [place], [place] [address] [phone]

## 2021-07-23 LAB — CBC WITH DIFFERENTIAL/PLATELET
Basophils Absolute: 0.1 10*3/uL (ref 0.0–0.2)
Basos: 1 %
EOS (ABSOLUTE): 0.5 10*3/uL — ABNORMAL HIGH (ref 0.0–0.4)
Eos: 6 %
Hematocrit: 36.7 % (ref 34.0–46.6)
Hemoglobin: 11.8 g/dL (ref 11.1–15.9)
Immature Grans (Abs): 0 10*3/uL (ref 0.0–0.1)
Immature Granulocytes: 0 %
Lymphocytes Absolute: 2.5 10*3/uL (ref 0.7–3.1)
Lymphs: 28 %
MCH: 29 pg (ref 26.6–33.0)
MCHC: 32.2 g/dL (ref 31.5–35.7)
MCV: 90 fL (ref 79–97)
Monocytes Absolute: 0.9 10*3/uL (ref 0.1–0.9)
Monocytes: 10 %
Neutrophils Absolute: 5 10*3/uL (ref 1.4–7.0)
Neutrophils: 55 %
Platelets: 316 10*3/uL (ref 150–450)
RBC: 4.07 x10E6/uL (ref 3.77–5.28)
RDW: 12.4 % (ref 11.7–15.4)
WBC: 9.1 10*3/uL (ref 3.4–10.8)

## 2021-07-23 LAB — IGG, IGA, IGM
IgA/Immunoglobulin A, Serum: 358 mg/dL — ABNORMAL HIGH (ref 87–352)
IgG (Immunoglobin G), Serum: 1043 mg/dL (ref 586–1602)
IgM (Immunoglobulin M), Srm: 107 mg/dL (ref 26–217)

## 2021-07-23 LAB — CD19 AND CD20, FLOW CYTOMETRY

## 2021-07-23 LAB — VITAMIN D 25 HYDROXY (VIT D DEFICIENCY, FRACTURES): Vit D, 25-Hydroxy: 44.1 ng/mL (ref 30.0–100.0)

## 2021-08-18 ENCOUNTER — Ambulatory Visit: Payer: PRIVATE HEALTH INSURANCE | Admitting: Family Medicine

## 2021-09-10 ENCOUNTER — Telehealth: Payer: Self-pay | Admitting: *Deleted

## 2021-09-10 NOTE — Telephone Encounter (Signed)
[facility] has been having difficulty getting Truxima infusions approved for pt. Asked we step in to see if we can help obtain approval.  ?Pt has been on Rituxan infusions since 01/2020, tolerating well and stable on therapy. Dx w/ NMO 09/2019. Dx code: G36.0.  Tried/failed: IV solumedrol, oral prednisone. No FDA approved med for anti-MOG for NMO. Received infusion on the following dates 01/28/2020, 02/11/2020, 08/11/2020, 09/10/2020, 02/25/2021, 03/11/2021. Was due for next infusion for 08/12/2021 and 08/26/2021 but has been unable to get d/t insurance stating NO PA needed for Truxima (biosimilar med, less expensive) and infusion suite cannot get in touch with anyone to do predetermination. Code for infusion for Truxima: [id number].  Dose: '1000mg'$  x2, separated by 2 weeks q 6 months.  ? ?I called [employer] at [phone] and spoke w/ Oris Drone. Pt ID: [id number] W. She states there is no Market researcher we can speak w/ about pre-determination. I expressed concern about the run around we have been receiving for getting pt infusion approved. Has been approved via Market researcher in the past. She again advised no medical director to speak w/. I asked for someone higher up that Dr. Felecia Shelling can speak w/ about getting approval. She placed me on hold. Came back and asked if it were for non-oncologic use or oncologic use. Advised non-oncologic use. She placed me back on hold. Came back on the line and stated she checked to see if we could speak with a medical director/nurse for courtesy review but there is no one we can speak with. States they received additional info from [facility] on 09/03/2021.  Waiting on them to review additional info. Courtesy review still open. Asked them to mark as urgent review. States she does not have capability. States nurse that takes a look at courtesy review will expedite it if she feels it is urgent. Should not take 30 days to be reviewed. Normally takes 14-21 business days to be reviewed. Should  have determination by next week.  ? ?Total time of call: 29 min ? ?Ref# for call: [id number] ? ?Forwarded this information to [facility]. ?

## 2021-09-14 ENCOUNTER — Other Ambulatory Visit: Payer: Self-pay | Admitting: Urology

## 2021-09-14 DIAGNOSIS — D4101 Neoplasm of uncertain behavior of right kidney: Secondary | ICD-10-CM

## 2021-09-23 DIAGNOSIS — G360 Neuromyelitis optica [Devic]: Secondary | ICD-10-CM | POA: Diagnosis not present

## 2021-09-25 DIAGNOSIS — I10 Essential (primary) hypertension: Secondary | ICD-10-CM | POA: Diagnosis not present

## 2021-09-25 DIAGNOSIS — E559 Vitamin D deficiency, unspecified: Secondary | ICD-10-CM | POA: Diagnosis not present

## 2021-09-25 DIAGNOSIS — E782 Mixed hyperlipidemia: Secondary | ICD-10-CM | POA: Diagnosis not present

## 2021-09-29 DIAGNOSIS — I471 Supraventricular tachycardia: Secondary | ICD-10-CM | POA: Diagnosis not present

## 2021-09-29 DIAGNOSIS — E559 Vitamin D deficiency, unspecified: Secondary | ICD-10-CM | POA: Diagnosis not present

## 2021-09-29 DIAGNOSIS — E782 Mixed hyperlipidemia: Secondary | ICD-10-CM | POA: Diagnosis not present

## 2021-09-29 DIAGNOSIS — I10 Essential (primary) hypertension: Secondary | ICD-10-CM | POA: Diagnosis not present

## 2021-10-05 ENCOUNTER — Ambulatory Visit
Admission: RE | Admit: 2021-10-05 | Discharge: 2021-10-05 | Disposition: A | Discharge status: Home / Self Care | Payer: BC Managed Care – PPO | Source: Ambulatory Visit | Attending: Urology | Admitting: Urology

## 2021-10-05 DIAGNOSIS — N281 Cyst of kidney, acquired: Secondary | ICD-10-CM | POA: Diagnosis not present

## 2021-10-05 DIAGNOSIS — D4101 Neoplasm of uncertain behavior of right kidney: Secondary | ICD-10-CM

## 2021-10-05 DIAGNOSIS — K7689 Other specified diseases of liver: Secondary | ICD-10-CM | POA: Diagnosis not present

## 2021-10-05 MED ORDER — GADOBENATE DIMEGLUMINE 529 MG/ML IV SOLN
14.0000 mL | Freq: Once | INTRAVENOUS | Status: AC | PRN
  Administered 2021-10-05: 14 mL via INTRAVENOUS

## 2021-10-07 DIAGNOSIS — G360 Neuromyelitis optica [Devic]: Secondary | ICD-10-CM | POA: Diagnosis not present

## 2021-10-21 DIAGNOSIS — D4101 Neoplasm of uncertain behavior of right kidney: Secondary | ICD-10-CM | POA: Diagnosis not present

## 2022-01-05 NOTE — Progress Notes (Signed)
PCP:  Fanny Bien, MD Primary Cardiologist: Larae Grooms, MD Electrophysiologist: Thompson Grayer, MD   Lauren [name] is a 56 y.o. female seen today for Thompson Grayer, MD for routine electrophysiology followup.  Since last being seen in our clinic the patient reports doing very well.  she denies chest pain, palpitations, dyspnea, PND, orthopnea, nausea, vomiting, dizziness, syncope, edema, weight gain, or early satiety.  Past Medical History:  Diagnosis Date   Allergy    seasonal   Boils 12/15/2010   multiple axilla   Carpal tunnel syndrome of left wrist    Chronic headaches 2008    improving   Constipation    Constipation 03/19/2015   DDD (degenerative disc disease), cervical    De Quervain's disease (tenosynovitis)    Right per pt   Encounter for Papanicolaou smear for cervical cancer screening 12/08/2016   Epigastric pain 09/21/2017   Heart murmur    History of Heart murmur as child.   Heart palpitations    Heartburn    Occ   HLD (hyperlipidemia) 2008   Hypertension    Menorrhagia    OA (osteoarthritis) of neck    Palpable abdominal aorta 09/21/2017   Right hip pain    Right ovarian cyst 11/16/2009   2 small, noted on pelvic US   Seizures ([place])    history of one time   Shoulder pain, right 07/2008   Ventral hernia    Weight loss 12/30/2017   Past Surgical History:  Procedure Laterality Date   COLONOSCOPY W/ POLYPECTOMY  12/24/2015   INSERTION OF MESH N/A 10/25/2017   Procedure: INSERTION OF MESH;  Surgeon: Kieth Brightly Arta Bruce, MD;  Location: [facility];  Service: General;  Laterality: N/A;   LEFT HEART CATH AND CORONARY ANGIOGRAPHY N/A 08/06/2020   Procedure: LEFT HEART CATH AND CORONARY ANGIOGRAPHY;  Surgeon: Jettie Booze, MD;  Location: [facility];  Service: Cardiovascular;  Laterality: N/A;   VENTRAL HERNIA REPAIR N/A 10/25/2017   Procedure: LAPAROSCOPIC VENTRAL HERNIA;  Surgeon: Mickeal Skinner, MD;  Location:  [facility];  Service: General;  Laterality: N/A;    Current Outpatient Medications  Medication Sig Dispense Refill   diltiazem (CARDIZEM CD) 360 MG 24 hr capsule Take 1 capsule (360 mg total) by mouth daily. 90 capsule 3   diltiazem (CARDIZEM) 30 MG tablet Take 1 tablet (30 mg total) by mouth 4 (four) times daily as needed (palpitations/fast heart rate). 60 tablet 3   lisinopril (ZESTRIL) 40 MG tablet TAKE 1 TABLET BY MOUTH EVERY DAY 90 tablet 3   Multiple Vitamins-Minerals (CENTRUM SILVER 50+WOMEN PO) Take by mouth.     rosuvastatin (CRESTOR) 5 MG tablet Take 5 mg by mouth at bedtime.     Wheat Dextrin (BENEFIBER PO) Take 2 Scoops by mouth daily.     No current facility-administered medications for this visit.    Allergies  Allergen Reactions   Penicillins Anaphylaxis, Hives and Swelling   Other Other (See Comments) and Swelling    Banana - Swelling and hives. Banana - Swelling and hives.    Social History   Socioeconomic History   Marital status: Soil scientist    Spouse name: Not on file   Number of children: Not on file   Years of education: Not on file   Highest education level: Not on file  Occupational History   Not on file  Tobacco Use   Smoking status: Former    Packs/day: 0.30  Years: 36.00    Total pack years: 10.80    Types: Cigarettes   Smokeless tobacco: Never   Tobacco comments:    1/2 pack per day  Vaping Use   Vaping Use: Never used  Substance and Sexual Activity   Alcohol use: Yes    Alcohol/week: 0.0 standard drinks of alcohol    Comment: seldom   Drug use: Not Currently    Types: Marijuana    Comment: 2-3 times per week for pain   Sexual activity: Yes    Birth control/protection: Post-menopausal  Other Topics Concern   Not on file  Social History Narrative   Works as Investment banker, corporate at [employer], 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   Current  smoker, 1/2 PPDx 26 years   Caffeine use: none   Right handed    Social Determinants of Health   Financial Resource Strain: Not on file  Food Insecurity: Not on file  Transportation Needs: Not on file  Physical Activity: Not on file  Stress: Not on file  Social Connections: Not on file  Intimate Partner Violence: Not on file     Review of Systems: All other systems reviewed and are otherwise negative except as noted above.  Physical Exam: Vitals:   01/11/22 0841  BP: 138/74  Pulse: 68  SpO2: 98%  Weight: 153 lb 3.2 oz (69.5 kg)  Height: '5\' 6"'$  (1.676 m)    GEN- The patient is well appearing, alert and oriented x 3 today.   HEENT: normocephalic, atraumatic; sclera clear, conjunctiva pink; hearing intact; oropharynx clear; neck supple, no JVP Lymph- no cervical lymphadenopathy Lungs- Clear to ausculation bilaterally, normal work of breathing.  No wheezes, rales, rhonchi Heart- Regular rate and rhythm, no murmurs, rubs or gallops, PMI not laterally displaced GI- soft, non-tender, non-distended, bowel sounds present, no hepatosplenomegaly Extremities- no clubbing, cyanosis, or edema; DP/PT/radial pulses 2+ bilaterally MS- no significant deformity or atrophy Skin- warm and dry, no rash or lesion Psych- euthymic mood, full affect Neuro- strength and sensation are intact  EKG is ordered. Personal review of EKG from today shows NSR at 68 bpm  Additional studies reviewed include: Previous EP office notes.   Assessment and Plan:  1.  Palpitations cMRI 11/23 with normal LV/RV, no LGE, no explanation for ventricular arrhythmias. Appear to be due to PACs, nonsustained atrial tachycardia and nonsustained ventricular tachycardia No evidence of structural heart disease and no obstructive CAD by cath. EKG today shows NSR at 68 without ectopy She overall has had improvement on diltiazem 360 mg daily.  With low risk MRI, can consider addition of low dose flecainide if palpitations  continue Unlikely to benefit from ablation with multiple sites as above.   Follow up with Dr. Quentin Ore in 12 months. Sooner with symptoms.   Shirley Friar, PA-C  01/11/22 8:46 AM

## 2022-01-11 ENCOUNTER — Ambulatory Visit (INDEPENDENT_AMBULATORY_CARE_PROVIDER_SITE_OTHER): Payer: BC Managed Care – PPO | Admitting: Student

## 2022-01-11 ENCOUNTER — Encounter: Payer: Self-pay | Admitting: Student

## 2022-01-11 VITALS — BP 138/74 | HR 68 | Ht 66.0 in | Wt 153.2 lb

## 2022-01-11 DIAGNOSIS — R002 Palpitations: Secondary | ICD-10-CM | POA: Diagnosis not present

## 2022-01-11 DIAGNOSIS — I10 Essential (primary) hypertension: Secondary | ICD-10-CM | POA: Diagnosis not present

## 2022-01-11 NOTE — Patient Instructions (Signed)
Medication Instructions:  Your physician recommends that you continue on your current medications as directed. Please refer to the Current Medication list given to you today.  *If you need a refill on your cardiac medications before your next appointment, please call your pharmacy*   Lab Work: None If you have labs (blood work) drawn today and your tests are completely normal, you will receive your results only by: MyChart Message (if you have MyChart) OR A paper copy in the mail If you have any lab test that is abnormal or we need to change your treatment, we will call you to review the results.   Follow-Up: At [facility], you and your health needs are our priority.  As part of our continuing mission to provide you with exceptional heart care, we have created designated Provider Care Teams.  These Care Teams include your primary Cardiologist (physician) and Advanced Practice Providers (APPs -  Physician Assistants and Nurse Practitioners) who all work together to provide you with the care you need, when you need it.   Your next appointment:   1 year(s)  The format for your next appointment:   In Person  Provider:   Will Camnitz, MD{   

## 2022-01-14 NOTE — Patient Instructions (Signed)

## 2022-01-14 NOTE — Progress Notes (Signed)
Chief Complaint  Patient presents with   Follow-up    Rm 12. Alone. Pt remains stable. No new concerns.    HISTORY OF PRESENT ILLNESS:  01/18/22 ALL:  Lauren [name] is a 56 y.o. female here today for follow up for NMO. She continues rituximab and tolerating well. Next dose 03/10/2022 and 03/24/2022.   She reports doing well. No changes in gait. No trouble with balance. No assistive device needed. She can go up and down steps without difficulty. She works 3rd shift at [employer]. She does have occasional right knee pain and swelling.   Last eye exam was 05/2021. Vision is at baseline. No new symptoms.   She is sleeping well. She usually goes to bed around 7am, and wakes around 12-1pm but then will lie back down around 2p and wakes around 6p. Mood is good. Memory is good.    HISTORY (copied from Dr Garth Bigness previous note)  Lauren [name], at the [facility] [facility] at [facility] for neurologic consultation regarding her transverse myelitis and recent diagnosis of anti-MOG positive neuromyelitis optica.  07/30/2021: She has been on Rituximab and her next infusion is 08/12/2021.      She tolerates it well.   MOGAD is doing well - no exacerbations  Gait is doing well.   She walks at work the entire night (third shift at [employer] as [employer]).   Balance is near baseline.  She denies any new weakness.   She denies any change in bladder functions.  She denies any numbness now.  She does note some knee pain on the right.     Bladder function is fine.     Fatigue is not much of a problem.    She sleeps well most days (third shift work).     Cognition is fine.    Vision is back to baseline and symmetric colors.    She saw ophtometry Engineer, building services on [place]).  Dr. Marin Comment shortly after the initial visit with me last year.  NMOSD History: She had transverse myelitis 2 days after receiving the first Carbon vaccination 09/18/2019.   On 09/20/2019, her left leg went numb.  Over the next 2 days, the right leg  started to get numb and the left leg numbness worsen and she became weaker.   She was able to walk though she felt she had a limp.  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  She did not have any numbness or weakness in the arms.  Because symptoms persisted, she went to Urgent care 09/28/19 and had standard labs that were fine.  She was prescribed 20 mg prednisone x 5 days and advised to call back if not better.  Symptoms persisted and she was referred to Dr. Manuella Ghazi..  He saw her 10/15/19 and then sent her that day to [facility] due to Brown-Sequard like symptoms.   She was seen in the emergency room and then admitted.  She was treated with IV Solumedrol for 5 days.   She had a lumbar puncture and her CSF showed oligoclonal bands (9).  Additionally, she had testing for neuromyelitis optica and is anti-MOG positive.  She is anti-NMO negative.   The anti-Mog antibody test had not returned yet when she was discharged.  She followed up with Dr. Manuella Ghazi and is referred for possible DMT therapy.    She received 5 days IV Solumedrol with improvement.   She had the onset of right eye pain  about a week ago, worse with movements and over the next couple days she lost vision in both eyes, right worse than left, consistent with optic neuritis..  At the visit, 01/10/2020, she had perception of shapes but could not count fingers in the majority of the visual field and was able to count fingers in the upper nasal quadrant.  On the left, she had mildly reduced vision but could read.  She received 4 days of IV Solu-Medrol and vision improved over the next couple weeks and has continued to improve towards baseline.  Imaging: MRI of the cervical and thoracic spine 10/16/2019 showed an enhancing lesion to the left of midline from T10 to T11.  The cervical spine was normal.  MRI of the brain showed some nonspecific white matter foci in the subcortical and deep white matter but no acute  findings.  Laboratory test: 01/10/2020: Hepatitis B core and surface antibodies were negative 10/16/2019: CSF showed 9 oligoclonal bands unique to the CSF. 10/17/2019: Anti-NMO was negative and anti-MOG was positive 1:100 ([facility]).  ESR and ANA were negative.  CCP IgG/IgAwas negative.  Quant TB negative   REVIEW OF SYSTEMS: Out of a complete 14 system review of symptoms, the patient complains only of the following symptoms, intermittent right knee pain, and all other reviewed systems are negative.   ALLERGIES: Allergies  Allergen Reactions   Penicillins Anaphylaxis, Hives and Swelling   Other Other (See Comments) and Swelling    Banana - Swelling and hives. Banana - Swelling and hives.     HOME MEDICATIONS: Outpatient Medications Prior to Visit  Medication Sig Dispense Refill   diltiazem (CARDIZEM CD) 360 MG 24 hr capsule Take 1 capsule (360 mg total) by mouth daily. 90 capsule 3   diltiazem (CARDIZEM) 30 MG tablet Take 1 tablet (30 mg total) by mouth 4 (four) times daily as needed (palpitations/fast heart rate). 60 tablet 3   lisinopril (ZESTRIL) 40 MG tablet TAKE 1 TABLET BY MOUTH EVERY DAY 90 tablet 3   Multiple Vitamins-Minerals (CENTRUM SILVER 50+WOMEN PO) Take by mouth.     rosuvastatin (CRESTOR) 5 MG tablet Take 5 mg by mouth at bedtime.     Wheat Dextrin (BENEFIBER PO) Take 2 Scoops by mouth daily.     No facility-administered medications prior to visit.     PAST MEDICAL HISTORY: Past Medical History:  Diagnosis Date   Allergy    seasonal   Boils 12/15/2010   multiple axilla   Carpal tunnel syndrome of left wrist    Chronic headaches 2008    improving   Constipation    Constipation 03/19/2015   DDD (degenerative disc disease), cervical    De Quervain's disease (tenosynovitis)    Right per pt   Encounter for Papanicolaou smear for cervical cancer screening 12/08/2016   Epigastric pain 09/21/2017   Heart murmur    History of Heart murmur as child.   Heart  palpitations    Heartburn    Occ   HLD (hyperlipidemia) 2008   Hypertension    Menorrhagia    OA (osteoarthritis) of neck    Palpable abdominal aorta 09/21/2017   Right hip pain    Right ovarian cyst 11/16/2009   2 small, noted on pelvic US   Seizures ([place])    history of one time   Shoulder pain, right 07/2008   Ventral hernia    Weight loss 12/30/2017     PAST SURGICAL HISTORY: Past Surgical History:  Procedure Laterality Date  COLONOSCOPY W/ POLYPECTOMY  12/24/2015   INSERTION OF MESH N/A 10/25/2017   Procedure: INSERTION OF MESH;  Surgeon: Kinsinger, Arta Bruce, MD;  Location: [facility];  Service: General;  Laterality: N/A;   LEFT HEART CATH AND CORONARY ANGIOGRAPHY N/A 08/06/2020   Procedure: LEFT HEART CATH AND CORONARY ANGIOGRAPHY;  Surgeon: Jettie Booze, MD;  Location: [facility];  Service: Cardiovascular;  Laterality: N/A;   VENTRAL HERNIA REPAIR N/A 10/25/2017   Procedure: LAPAROSCOPIC VENTRAL HERNIA;  Surgeon: Kieth Brightly, Arta Bruce, MD;  Location: [facility];  Service: General;  Laterality: N/A;     FAMILY HISTORY: Family History  Problem Relation Age of Onset   Brain cancer Mother    Heart failure Father    Cirrhosis Brother        alcohol related   Heart disease Maternal Grandmother    Breast cancer Neg Hx    Colon cancer Neg Hx    Esophageal cancer Neg Hx    Stomach cancer Neg Hx      SOCIAL HISTORY: Social History   Socioeconomic History   Marital status: Soil scientist    Spouse name: Not on file   Number of children: Not on file   Years of education: Not on file   Highest education level: Not on file  Occupational History   Not on file  Tobacco Use   Smoking status: Former    Packs/day: 0.30    Years: 36.00    Total pack years: 10.80    Types: Cigarettes   Smokeless tobacco: Never   Tobacco comments:    1/2 pack per day  Vaping Use   Vaping Use: Never used  Substance and Sexual Activity    Alcohol use: Yes    Alcohol/week: 0.0 standard drinks of alcohol    Comment: seldom   Drug use: Not Currently    Types: Marijuana    Comment: 2-3 times per week for pain   Sexual activity: Yes    Birth control/protection: Post-menopausal  Other Topics Concern   Not on file  Social History Narrative   Works as Investment banker, corporate at [employer], lives with significant other in [place]   No EToh, former beer drinker   No illicit drug use.   Current smoker, 1/2 PPDx 26 years   Caffeine use: none   Right handed    Social Determinants of Health   Financial Resource Strain: Not on file  Food Insecurity: Not on file  Transportation Needs: Not on file  Physical Activity: Not on file  Stress: Not on file  Social Connections: Not on file  Intimate Partner Violence: Not on file     PHYSICAL EXAM  Vitals:   01/18/22 1037  BP: 131/84  Pulse: 71  Weight: 153 lb 8 oz (69.6 kg)  Height: $Remove'5\' 6"'[id number]$  (1.676 m)   Body mass index is 24.78 kg/m.  Generalized: Well developed, in no acute distress  Cardiology: normal rate and rhythm, no murmur auscultated  Respiratory: clear to auscultation bilaterally    Neurological examination  Mentation: Alert oriented to time, place, history taking. Follows all commands speech and language fluent Cranial nerve II-XII: Pupils were equal round reactive to light. Extraocular movements were full, visual field were full on confrontational test with exception of right upper quad. Facial sensation and strength were normal. Head turning and shoulder shrug  were normal and symmetric. Motor: The motor testing reveals 5 over 5 strength of all 4 extremities.  Good symmetric motor tone is noted throughout.  Sensory: Sensory testing is intact to soft touch on all 4 extremities. No evidence of extinction is noted.  Coordination: Cerebellar testing reveals good finger-nose-finger and heel-to-shin bilaterally.  Gait and station: Gait is normal.  Reflexes:  Deep tendon reflexes are symmetric and normal bilaterally.    DIAGNOSTIC DATA (LABS, IMAGING, TESTING) - I reviewed patient records, labs, notes, testing and imaging myself where available.  Lab Results  Component Value Date   WBC 9.1 07/20/2021   HGB 11.8 07/20/2021   HCT 36.7 07/20/2021   MCV 90 07/20/2021   PLT 316 07/20/2021      Component Value Date/Time   NA 139 02/18/2021 1708   NA 140 07/29/2020 1016   K 4.2 02/18/2021 1708   CL 107 02/18/2021 1708   CO2 23 02/18/2021 1708   GLUCOSE 102 (H) 02/18/2021 1708   BUN 15 02/18/2021 1708   BUN 10 07/29/2020 1016   CREATININE 0.65 02/18/2021 1708   CREATININE 0.76 06/01/2013 1120   CALCIUM 9.3 02/18/2021 1708   PROT 7.2 02/18/2021 1708   PROT 7.8 12/28/2017 1614   ALBUMIN 4.0 02/18/2021 1708   ALBUMIN 4.3 12/28/2017 1614   AST 27 02/18/2021 1708   ALT 23 02/18/2021 1708   ALKPHOS 53 02/18/2021 1708   BILITOT 0.6 02/18/2021 1708   BILITOT 0.2 12/28/2017 1614   GFRNONAA >60 02/18/2021 1708   GFRNONAA >89 06/01/2013 1120   GFRAA 108 07/29/2020 1016   GFRAA >89 06/01/2013 1120   Lab Results  Component Value Date   CHOL 158 10/08/2015   HDL 41 10/08/2015   LDLCALC 101 (H) 10/08/2015   TRIG 78 10/08/2015   CHOLHDL 3.9 10/08/2015   No results found for: "HGBA1C" Lab Results  Component Value Date   VITAMINB12 >2000 (H) 07/29/2020   Lab Results  Component Value Date   TSH 0.763 09/28/2019        No data to display               No data to display           ASSESSMENT AND PLAN  56 y.o. year old female  has a past medical history of Allergy, Boils (12/15/2010), Carpal tunnel syndrome of left wrist, Chronic headaches (2008), Constipation, Constipation (03/19/2015), DDD (degenerative disc disease), cervical, De Quervain's disease (tenosynovitis), Encounter for Papanicolaou smear for cervical cancer screening (12/08/2016), Epigastric pain (09/21/2017), Heart murmur, Heart palpitations, Heartburn, HLD  (hyperlipidemia) (2008), Hypertension, Menorrhagia, OA (osteoarthritis) of neck, Palpable abdominal aorta (09/21/2017), Right hip pain, Right ovarian cyst (11/16/2009), Seizures ([place]), Shoulder pain, right (07/2008), Ventral hernia, and Weight loss (12/30/2017). here with    Neuromyelitis optica (devic) ([place]) - Plan: CBC with Differential/Platelets, IgG, IgA, IgM  MOG antibody disease (HCC)  High risk medication use  Vitamin D deficiency  Lauren [name] is doing very well. We will continue rituximab infusions every 6 months. I will update labs, today. She will continue vitamin D OTC. Healthy lifestyle habits encouraged. She will follow up with PCP as directed. She will return to see Dr Felecia Shelling in 6 months, sooner if needed. She verbalizes understanding and agreement with this plan.   Orders Placed This Encounter  Procedures   CBC with Differential/Platelets   IgG, IgA, IgM     No orders of the defined types were placed in this encounter.    Debbora Presto, MSN, FNP-C 01/18/2022, 11:19 AM  [facility] [address] [place], [place]  [address] [phone]

## 2022-01-18 ENCOUNTER — Ambulatory Visit (INDEPENDENT_AMBULATORY_CARE_PROVIDER_SITE_OTHER): Payer: BC Managed Care – PPO | Admitting: Family Medicine

## 2022-01-18 ENCOUNTER — Encounter: Payer: Self-pay | Admitting: Family Medicine

## 2022-01-18 VITALS — BP 131/84 | HR 71 | Ht 66.0 in | Wt 153.5 lb

## 2022-01-18 DIAGNOSIS — Z79899 Other long term (current) drug therapy: Secondary | ICD-10-CM

## 2022-01-18 DIAGNOSIS — G378 Other specified demyelinating diseases of central nervous system: Secondary | ICD-10-CM

## 2022-01-18 DIAGNOSIS — E559 Vitamin D deficiency, unspecified: Secondary | ICD-10-CM | POA: Diagnosis not present

## 2022-01-18 DIAGNOSIS — G360 Neuromyelitis optica [Devic]: Secondary | ICD-10-CM

## 2022-01-19 LAB — IGG, IGA, IGM
IgA/Immunoglobulin A, Serum: 369 mg/dL — ABNORMAL HIGH (ref 87–352)
IgG (Immunoglobin G), Serum: 1167 mg/dL (ref 586–1602)
IgM (Immunoglobulin M), Srm: 111 mg/dL (ref 26–217)

## 2022-01-19 LAB — CBC WITH DIFFERENTIAL/PLATELET
Basophils Absolute: 0.1 10*3/uL (ref 0.0–0.2)
Basos: 1 %
EOS (ABSOLUTE): 0.4 10*3/uL (ref 0.0–0.4)
Eos: 4 %
Hematocrit: 39.7 % (ref 34.0–46.6)
Hemoglobin: 13.3 g/dL (ref 11.1–15.9)
Immature Grans (Abs): 0 10*3/uL (ref 0.0–0.1)
Immature Granulocytes: 0 %
Lymphocytes Absolute: 2 10*3/uL (ref 0.7–3.1)
Lymphs: 22 %
MCH: 29.8 pg (ref 26.6–33.0)
MCHC: 33.5 g/dL (ref 31.5–35.7)
MCV: 89 fL (ref 79–97)
Monocytes Absolute: 0.8 10*3/uL (ref 0.1–0.9)
Monocytes: 9 %
Neutrophils Absolute: 5.8 10*3/uL (ref 1.4–7.0)
Neutrophils: 64 %
Platelets: 319 10*3/uL (ref 150–450)
RBC: 4.46 x10E6/uL (ref 3.77–5.28)
RDW: 12.2 % (ref 11.7–15.4)
WBC: 9.1 10*3/uL (ref 3.4–10.8)

## 2022-02-10 DIAGNOSIS — Z114 Encounter for screening for human immunodeficiency virus [HIV]: Secondary | ICD-10-CM | POA: Diagnosis not present

## 2022-02-10 DIAGNOSIS — Z0000 Encounter for general adult medical examination without abnormal findings: Secondary | ICD-10-CM | POA: Diagnosis not present

## 2022-02-16 DIAGNOSIS — Z23 Encounter for immunization: Secondary | ICD-10-CM | POA: Diagnosis not present

## 2022-02-16 DIAGNOSIS — Z0000 Encounter for general adult medical examination without abnormal findings: Secondary | ICD-10-CM | POA: Diagnosis not present

## 2022-02-16 DIAGNOSIS — K5900 Constipation, unspecified: Secondary | ICD-10-CM | POA: Diagnosis not present

## 2022-02-16 DIAGNOSIS — J309 Allergic rhinitis, unspecified: Secondary | ICD-10-CM | POA: Diagnosis not present

## 2022-02-16 DIAGNOSIS — Z1211 Encounter for screening for malignant neoplasm of colon: Secondary | ICD-10-CM | POA: Diagnosis not present

## 2022-02-16 DIAGNOSIS — R040 Epistaxis: Secondary | ICD-10-CM | POA: Diagnosis not present

## 2022-03-02 ENCOUNTER — Telehealth: Payer: Self-pay | Admitting: *Deleted

## 2022-03-02 NOTE — Telephone Encounter (Signed)
Faxed completed/signed [facility]) to [phone]. Received fax confirmation.

## 2022-03-10 DIAGNOSIS — G360 Neuromyelitis optica [Devic]: Secondary | ICD-10-CM | POA: Diagnosis not present

## 2022-03-24 DIAGNOSIS — G360 Neuromyelitis optica [Devic]: Secondary | ICD-10-CM | POA: Diagnosis not present

## 2022-04-20 DIAGNOSIS — Z86010 Personal history of colonic polyps: Secondary | ICD-10-CM | POA: Diagnosis not present

## 2022-04-20 DIAGNOSIS — K5730 Diverticulosis of large intestine without perforation or abscess without bleeding: Secondary | ICD-10-CM | POA: Diagnosis not present

## 2022-04-20 DIAGNOSIS — D122 Benign neoplasm of ascending colon: Secondary | ICD-10-CM | POA: Diagnosis not present

## 2022-04-20 DIAGNOSIS — D125 Benign neoplasm of sigmoid colon: Secondary | ICD-10-CM | POA: Diagnosis not present

## 2022-04-20 DIAGNOSIS — K648 Other hemorrhoids: Secondary | ICD-10-CM | POA: Diagnosis not present

## 2022-04-27 DIAGNOSIS — E559 Vitamin D deficiency, unspecified: Secondary | ICD-10-CM | POA: Diagnosis not present

## 2022-04-29 DIAGNOSIS — I10 Essential (primary) hypertension: Secondary | ICD-10-CM | POA: Diagnosis not present

## 2022-04-29 DIAGNOSIS — E782 Mixed hyperlipidemia: Secondary | ICD-10-CM | POA: Diagnosis not present

## 2022-04-29 DIAGNOSIS — I471 Supraventricular tachycardia, unspecified: Secondary | ICD-10-CM | POA: Diagnosis not present

## 2022-04-29 DIAGNOSIS — K5900 Constipation, unspecified: Secondary | ICD-10-CM | POA: Diagnosis not present

## 2022-06-10 DIAGNOSIS — K5900 Constipation, unspecified: Secondary | ICD-10-CM | POA: Diagnosis not present

## 2022-06-10 DIAGNOSIS — R002 Palpitations: Secondary | ICD-10-CM | POA: Diagnosis not present

## 2022-06-10 DIAGNOSIS — I471 Supraventricular tachycardia, unspecified: Secondary | ICD-10-CM | POA: Diagnosis not present

## 2022-06-10 DIAGNOSIS — I10 Essential (primary) hypertension: Secondary | ICD-10-CM | POA: Diagnosis not present

## 2022-07-08 DIAGNOSIS — K5900 Constipation, unspecified: Secondary | ICD-10-CM | POA: Diagnosis not present

## 2022-07-08 DIAGNOSIS — J309 Allergic rhinitis, unspecified: Secondary | ICD-10-CM | POA: Diagnosis not present

## 2022-07-08 DIAGNOSIS — I10 Essential (primary) hypertension: Secondary | ICD-10-CM | POA: Diagnosis not present

## 2022-07-16 DIAGNOSIS — G8929 Other chronic pain: Secondary | ICD-10-CM | POA: Diagnosis not present

## 2022-07-16 DIAGNOSIS — M25561 Pain in right knee: Secondary | ICD-10-CM | POA: Diagnosis not present

## 2022-07-26 ENCOUNTER — Ambulatory Visit (INDEPENDENT_AMBULATORY_CARE_PROVIDER_SITE_OTHER): Payer: BC Managed Care – PPO | Admitting: Neurology

## 2022-07-26 ENCOUNTER — Encounter: Payer: Self-pay | Admitting: Neurology

## 2022-07-26 VITALS — BP 160/87 | HR 79 | Ht 66.0 in | Wt 151.0 lb

## 2022-07-26 DIAGNOSIS — G3781 Myelin oligodendrocyte glycoprotein antibody disease: Secondary | ICD-10-CM

## 2022-07-26 DIAGNOSIS — E559 Vitamin D deficiency, unspecified: Secondary | ICD-10-CM

## 2022-07-26 DIAGNOSIS — Z8669 Personal history of other diseases of the nervous system and sense organs: Secondary | ICD-10-CM | POA: Diagnosis not present

## 2022-07-26 DIAGNOSIS — Z79899 Other long term (current) drug therapy: Secondary | ICD-10-CM | POA: Diagnosis not present

## 2022-07-26 DIAGNOSIS — G373 Acute transverse myelitis in demyelinating disease of central nervous system: Secondary | ICD-10-CM

## 2022-07-26 NOTE — Progress Notes (Signed)
[facility]  PATIENT: Lauren [name] DOB: 13-Jun-1966  REFERRING DOCTOR OR PCP:  Rachell Cipro SOURCE:   _________________________________   HISTORICAL  CHIEF COMPLAINT:  Chief Complaint  Patient presents with   Room 10    Pt is here Alone. Pt states that things have been going fine. Pt states no new symptoms.     HISTORY OF PRESENT ILLNESS:  Lauren [name], at the [place] center at [facility] for neurologic consultation regarding her transverse myelitis and recent diagnosis of anti-MOG positive demyelination.  07/26/2022 She has been on Rituximab and her next infusion is March 2024      She tolerates it well.   MOGAD is doing well - no exacerbations  Gait is doing well.   She walks at work the entire night (third shift at [employer] as [employer]).   Balance is near baseline. And she goes up and down ladders.    She denies any new weakness.   She denies any change in bladder functions.  She denies any numbness now.  She does note some knee pain on the right.     Bladder function is fine.     Vision is back to baseline and symmetric colors.    She saw ophtometry Engineer, building services on [place]).  She sees optometry annually.     Fatigue is not much of a problem.    She sleeps well most days (third shift work).     Cognition is fine.  She denies depression or anxiety.      NMOSD History: She had transverse myelitis 2 days after receiving the first [place] vaccination 09/18/2019.   On 09/20/2019, her left leg went numb.  Over the next 2 days, the right leg started to get numb and the left leg numbness worsen and she became weaker.   She was able to walk though she felt she had a limp.  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  She did not have any numbness or weakness in the arms.  Because symptoms persisted, she went to Urgent care 09/28/19 and had standard labs that were fine.  She was prescribed 20 mg prednisone x 5 days and  advised to call back if not better.  Symptoms persisted and she was referred to Dr. Manuella Ghazi..  He saw her 10/15/19 and then sent her that day to [facility] due to Brown-Sequard like symptoms.   She was seen in the emergency room and then admitted.  She was treated with IV Solumedrol for 5 days.   She had a lumbar puncture and her CSF showed oligoclonal bands (9).  Additionally, she had testing for neuromyelitis optica and is anti-MOG positive.  She is anti-NMO negative.   The anti-Mog antibody test had not returned yet when she was discharged.  She followed up with Dr. Manuella Ghazi and is referred for possible DMT therapy.    She received 5 days IV Solumedrol with improvement.   She had the onset of right eye pain about a week ago, worse with movements and over the next couple days she lost vision in both eyes, right worse than left, consistent with optic neuritis..  At the visit, 01/10/2020, she had perception of shapes but could not count fingers in the majority of the visual field and was able to count fingers in the upper nasal quadrant.  On the left, she had mildly reduced vision but could read.  She received 4 days of IV Solu-Medrol  and vision improved over the next couple weeks and has continued to improve towards baseline.  Imaging: MRI of the cervical and thoracic spine 10/16/2019 showed an enhancing lesion to the left of midline from T10 to T11.  The cervical spine was normal.  MRI of the brain showed some nonspecific white matter foci in the subcortical and deep white matter but no acute findings.  Laboratory test: 01/10/2020: Hepatitis B core and surface antibodies were negative 10/16/2019: CSF showed 9 oligoclonal bands unique to the CSF. 10/17/2019: Anti-NMO was negative and anti-MOG was positive 1:100 ([facility]).  ESR and ANA were negative.  CCP IgG/IgAwas negative.  Quant TB negative    REVIEW OF SYSTEMS: Constitutional: No fevers, chills, sweats, or change in appetite Eyes: No visual changes, double vision,  eye pain Ear, nose and throat: No hearing loss, ear pain, nasal congestion, sore throat Cardiovascular: No chest pain, palpitations Respiratory:  No shortness of breath at rest or with exertion.   No wheezes GastrointestinaI: No nausea, vomiting, diarrhea, abdominal pain, fecal incontinence Genitourinary:  No dysuria, urinary retention or frequency.  No nocturia. Musculoskeletal:  No neck pain, back pain Integumentary: No rash, pruritus, skin lesions Neurological: as above Psychiatric: No depression at this time.  No anxiety Endocrine: No palpitations, diaphoresis, change in appetite, change in weigh or increased thirst Hematologic/Lymphatic:  No anemia, purpura, petechiae. Allergic/Immunologic: No itchy/runny eyes, nasal congestion, recent allergic reactions, rashes  ALLERGIES: Allergies  Allergen Reactions   Penicillins Anaphylaxis, Hives and Swelling   Other Other (See Comments) and Swelling    Banana - Swelling and hives. Banana - Swelling and hives.    HOME MEDICATIONS:  Current Outpatient Medications:    diltiazem (CARDIZEM CD) 360 MG 24 hr capsule, Take 1 capsule (360 mg total) by mouth daily., Disp: 90 capsule, Rfl: 3   diltiazem (CARDIZEM) 30 MG tablet, Take 1 tablet (30 mg total) by mouth 4 (four) times daily as needed (palpitations/fast heart rate)., Disp: 60 tablet, Rfl: 3   linaclotide (LINZESS) 72 MCG capsule, Take 72 mcg by mouth daily before breakfast., Disp: , Rfl:    lisinopril (ZESTRIL) 40 MG tablet, TAKE 1 TABLET BY MOUTH EVERY DAY, Disp: 90 tablet, Rfl: 3   Multiple Vitamins-Minerals (CENTRUM SILVER 50+WOMEN PO), Take by mouth., Disp: , Rfl:    rosuvastatin (CRESTOR) 5 MG tablet, Take 5 mg by mouth at bedtime., Disp: , Rfl:    Wheat Dextrin (BENEFIBER PO), Take 2 Scoops by mouth daily., Disp: , Rfl:   PAST MEDICAL HISTORY: Past Medical History:  Diagnosis Date   Allergy    seasonal   Boils 12/15/2010   multiple axilla   Carpal tunnel syndrome of left  wrist    Chronic headaches 2008    improving   Constipation    Constipation 03/19/2015   DDD (degenerative disc disease), cervical    De Quervain's disease (tenosynovitis)    Right per pt   Encounter for Papanicolaou smear for cervical cancer screening 12/08/2016   Epigastric pain 09/21/2017   Heart murmur    History of Heart murmur as child.   Heart palpitations    Heartburn    Occ   HLD (hyperlipidemia) 2008   Hypertension    Menorrhagia    OA (osteoarthritis) of neck    Palpable abdominal aorta 09/21/2017   Right hip pain    Right ovarian cyst 11/16/2009   2 small, noted on pelvic US   Seizures ([place])    history of one time  Shoulder pain, right 07/2008   Ventral hernia    Weight loss 12/30/2017    PAST SURGICAL HISTORY: Past Surgical History:  Procedure Laterality Date   COLONOSCOPY W/ POLYPECTOMY  12/24/2015   INSERTION OF MESH N/A 10/25/2017   Procedure: INSERTION OF MESH;  Surgeon: Kinsinger, Arta Bruce, MD;  Location: [facility];  Service: General;  Laterality: N/A;   LEFT HEART CATH AND CORONARY ANGIOGRAPHY N/A 08/06/2020   Procedure: LEFT HEART CATH AND CORONARY ANGIOGRAPHY;  Surgeon: Jettie Booze, MD;  Location: [facility];  Service: Cardiovascular;  Laterality: N/A;   VENTRAL HERNIA REPAIR N/A 10/25/2017   Procedure: LAPAROSCOPIC VENTRAL HERNIA;  Surgeon: Kieth Brightly, Arta Bruce, MD;  Location: [facility];  Service: General;  Laterality: N/A;    FAMILY HISTORY: Family History  Problem Relation Age of Onset   Brain cancer Mother    Heart failure Father    Cirrhosis Brother        alcohol related   Heart disease Maternal Grandmother    Breast cancer Neg Hx    Colon cancer Neg Hx    Esophageal cancer Neg Hx    Stomach cancer Neg Hx     SOCIAL HISTORY:  Social History   Socioeconomic History   Marital status: Soil scientist    Spouse name: Not on file   Number of children: Not on file   Years of education:  Not on file   Highest education level: Not on file  Occupational History   Not on file  Tobacco Use   Smoking status: Former    Packs/day: 0.30    Years: 36.00    Total pack years: 10.80    Types: Cigarettes   Smokeless tobacco: Never   Tobacco comments:    1/2 pack per day  Vaping Use   Vaping Use: Never used  Substance and Sexual Activity   Alcohol use: Yes    Alcohol/week: 0.0 standard drinks of alcohol    Comment: seldom   Drug use: Not Currently    Types: Marijuana    Comment: 2-3 times per week for pain   Sexual activity: Yes    Birth control/protection: Post-menopausal  Other Topics Concern   Not on file  Social History Narrative   Works as Investment banker, corporate at [employer], lives with significant other in [place]   No EToh, former beer drinker   No illicit drug use.   Current smoker, 1/2 PPDx 26 years   Caffeine use: none   Right handed    Social Determinants of Health   Financial Resource Strain: Not on file  Food Insecurity: Not on file  Transportation Needs: Not on file  Physical Activity: Not on file  Stress: Not on file  Social Connections: Not on file  Intimate Partner Violence: Not on file     PHYSICAL EXAM  Vitals:   07/26/22 0753  BP: (!) 160/87  Pulse: 79  Weight: 151 lb (68.5 kg)  Height: 5' 6"$  (1.676 m)    Body mass index is 24.37 kg/m.  VA 20/30 OD VA 20/50 OS  General: The patient is well-developed and well-nourished and in no acute distress  HEENT:  Head is [place]/AT.  Sclera are anicteri  Skin: Extremities are without rash or  edema.   Neurologic Exam  Mental status: The patient is alert and oriented x 3 at the time of the examination. The patient has apparent normal recent and remote memory, with an apparently  normal attention span and concentration ability.   Speech is normal.  Cranial nerves: Extraocular movements are full.  She has mild ptosis, right greater than left.  Color vision was symmetric.   Visual  fields now FTC.  Facial strength and sensation were fine.   No obvious hearing deficits are noted.  Motor:  Muscle bulk is normal.   Tone is Normal.  Strength is 5/5 now.    Sensory: Sensory testing is intact to pinprick, soft touch and vibration sensation in the arms and legs  Coordination: Cerebellar testing reveals good finger-nose-finger and heel-to-shin bilaterally   Gait and station: Station is normal.   She has normal gait though tandem is mildly wide.   Romberg is negative     Reflexes: Deep tendon reflexes are symmetric and normal bilaterally.       DIAGNOSTIC DATA (LABS, IMAGING, TESTING) - I reviewed patient records, labs, notes, testing and imaging myself where available.  Lab Results  Component Value Date   WBC 9.1 01/18/2022   HGB 13.3 01/18/2022   HCT 39.7 01/18/2022   MCV 89 01/18/2022   PLT 319 01/18/2022      Component Value Date/Time   NA 139 02/18/2021 1708   NA 140 07/29/2020 1016   K 4.2 02/18/2021 1708   CL 107 02/18/2021 1708   CO2 23 02/18/2021 1708   GLUCOSE 102 (H) 02/18/2021 1708   BUN 15 02/18/2021 1708   BUN 10 07/29/2020 1016   CREATININE 0.65 02/18/2021 1708   CREATININE 0.76 06/01/2013 1120   CALCIUM 9.3 02/18/2021 1708   PROT 7.2 02/18/2021 1708   PROT 7.8 12/28/2017 1614   ALBUMIN 4.0 02/18/2021 1708   ALBUMIN 4.3 12/28/2017 1614   AST 27 02/18/2021 1708   ALT 23 02/18/2021 1708   ALKPHOS 53 02/18/2021 1708   BILITOT 0.6 02/18/2021 1708   BILITOT 0.2 12/28/2017 1614   GFRNONAA >60 02/18/2021 1708   GFRNONAA >89 06/01/2013 1120   GFRAA 108 07/29/2020 1016   GFRAA >89 06/01/2013 1120   Lab Results  Component Value Date   CHOL 158 10/08/2015   HDL 41 10/08/2015   LDLCALC 101 (H) 10/08/2015   TRIG 78 10/08/2015   CHOLHDL 3.9 10/08/2015   No results found for: "HGBA1C" Lab Results  Component Value Date   VITAMINB12 >2000 (H) 07/29/2020   Lab Results  Component Value Date   TSH 0.763 09/28/2019       ASSESSMENT AND  PLAN  MOG antibody disease  High risk medication use  History of optic neuritis  Transverse myelitis (HCC)  Vitamin D deficiency   1.   She has not had any more episodes of transverse myelitis or optic neuritis.  She has anti-MOG antibodies c/w MOGAD  . Marland Kitchen    Interestingly, she does have oligoclonal bands.  This was seen in about 13% of adults (Jarius 2020), usually during a relapse, in 1 study of anti-MOG  .  She has done very well on rituximab and should continue for the time being.   There is still a possibility that her disease was monophasic at that following the vaccination and we will continue to reassess as new information becomes available. 2.  Check IgG/IgM, CBC with differential and CD19/CD20.  We will recheck anti-MOG. 3.  Return in 6 months.    Will need IgG/IgM and CBC/D at that time   Yadira Hada A. Felecia Shelling, MD, [facility] [id number], [id number] Certified in Neurology, Clinical Neurophysiology, Sleep Medicine and Neuroimaging  [facility]  [address], [address], [place] [address] [phone]

## 2022-07-27 LAB — CBC WITH DIFFERENTIAL/PLATELET
Basophils Absolute: 0.1 10*3/uL (ref 0.0–0.2)
Basos: 1 %
EOS (ABSOLUTE): 0.3 10*3/uL (ref 0.0–0.4)
Eos: 3 %
Hematocrit: 38.5 % (ref 34.0–46.6)
Hemoglobin: 12.5 g/dL (ref 11.1–15.9)
Immature Grans (Abs): 0 10*3/uL (ref 0.0–0.1)
Immature Granulocytes: 0 %
Lymphocytes Absolute: 2.5 10*3/uL (ref 0.7–3.1)
Lymphs: 23 %
MCH: 29.8 pg (ref 26.6–33.0)
MCHC: 32.5 g/dL (ref 31.5–35.7)
MCV: 92 fL (ref 79–97)
Monocytes Absolute: 0.9 10*3/uL (ref 0.1–0.9)
Monocytes: 8 %
Neutrophils Absolute: 7.1 10*3/uL — ABNORMAL HIGH (ref 1.4–7.0)
Neutrophils: 65 %
Platelets: 314 10*3/uL (ref 150–450)
RBC: 4.2 x10E6/uL (ref 3.77–5.28)
RDW: 12.4 % (ref 11.7–15.4)
WBC: 10.9 10*3/uL — ABNORMAL HIGH (ref 3.4–10.8)

## 2022-07-27 LAB — ANTI-MOG, SERUM: MOG Antibody, Cell-based IFA: NEGATIVE

## 2022-08-02 ENCOUNTER — Telehealth: Payer: Self-pay | Admitting: Neurology

## 2022-08-02 NOTE — Telephone Encounter (Signed)
Pt is calling and said someone left her a voice mail. Stated she need a blood re-draw. Pt said she is calling to confirm before she comes back over here for the re-draw.

## 2022-08-02 NOTE — Telephone Encounter (Signed)
Called pt and asked her if she could come in tomorrow 08/03/2022 for her missing lab draw. Pt agreed to come in tomorrow morning.

## 2022-08-03 ENCOUNTER — Other Ambulatory Visit: Payer: Self-pay | Admitting: *Deleted

## 2022-08-03 ENCOUNTER — Other Ambulatory Visit (INDEPENDENT_AMBULATORY_CARE_PROVIDER_SITE_OTHER): Payer: Self-pay

## 2022-08-03 ENCOUNTER — Other Ambulatory Visit: Payer: Self-pay

## 2022-08-03 DIAGNOSIS — G3781 Myelin oligodendrocyte glycoprotein antibody disease: Secondary | ICD-10-CM

## 2022-08-03 DIAGNOSIS — Z79899 Other long term (current) drug therapy: Secondary | ICD-10-CM

## 2022-08-03 DIAGNOSIS — Z0289 Encounter for other administrative examinations: Secondary | ICD-10-CM

## 2022-08-04 ENCOUNTER — Other Ambulatory Visit: Payer: Self-pay | Admitting: Family Medicine

## 2022-08-04 DIAGNOSIS — Z1231 Encounter for screening mammogram for malignant neoplasm of breast: Secondary | ICD-10-CM

## 2022-08-05 LAB — CD20 B CELLS
% CD19-B Cells: 0 % — ABNORMAL LOW (ref 4.6–22.1)
% CD20-B Cells: 0 % — ABNORMAL LOW (ref 5.0–22.3)

## 2022-08-12 ENCOUNTER — Telehealth: Payer: Self-pay | Admitting: Neurology

## 2022-08-12 NOTE — Telephone Encounter (Signed)
Called pt. After she got home from work, she noticed her legs were locking up. Denies any weakness. 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 No signs infection/illness. Reports this has happened but when she was newly dx w/ transverse myelitis.  Sx improving now, not as severe. However, she is concerned about sx and wanting to know if she should continue to monitor or what MD recommends. Aware I will send to [facility] since Dr. Felecia Shelling out to review.  Next Rituximab infusion 08/24/22. She last saw Dr. Felecia Shelling 07/26/22. Followed for MOG antibody disease/transverse myelitis.

## 2022-08-12 NOTE — Telephone Encounter (Signed)
I already confirmed with pt that she had no signs illness/infection (see previously note)  I called pt. Relayed Dr. Rhea Belton recommendation. She will continue to monitor and make sure she stays well hydrated. She will call if she develops any new or worsening sx moving forward.

## 2022-08-12 NOTE — Telephone Encounter (Signed)
Pt would like a call to discuss her right leg locking up on her since yesterday when trying to get ready for work.

## 2022-08-12 NOTE — Telephone Encounter (Signed)
Will continue to monitor, make sure she does not have any signs of active infection, please make sure she is also well-hydrated,

## 2022-09-02 DIAGNOSIS — G360 Neuromyelitis optica [Devic]: Secondary | ICD-10-CM | POA: Diagnosis not present

## 2022-09-16 ENCOUNTER — Ambulatory Visit
Admission: RE | Admit: 2022-09-16 | Discharge: 2022-09-16 | Disposition: A | Discharge status: Home / Self Care | Payer: BC Managed Care – PPO | Source: Ambulatory Visit | Attending: Family Medicine | Admitting: Family Medicine

## 2022-09-16 DIAGNOSIS — G360 Neuromyelitis optica [Devic]: Secondary | ICD-10-CM | POA: Diagnosis not present

## 2022-09-16 DIAGNOSIS — Z1231 Encounter for screening mammogram for malignant neoplasm of breast: Secondary | ICD-10-CM

## 2022-10-27 DIAGNOSIS — D4101 Neoplasm of uncertain behavior of right kidney: Secondary | ICD-10-CM | POA: Diagnosis not present

## 2022-10-27 DIAGNOSIS — R8271 Bacteriuria: Secondary | ICD-10-CM | POA: Diagnosis not present

## 2022-10-27 DIAGNOSIS — R319 Hematuria, unspecified: Secondary | ICD-10-CM | POA: Diagnosis not present

## 2022-12-09 ENCOUNTER — Other Ambulatory Visit: Payer: Self-pay | Admitting: *Deleted

## 2022-12-09 DIAGNOSIS — Z79899 Other long term (current) drug therapy: Secondary | ICD-10-CM

## 2022-12-09 DIAGNOSIS — G3781 Myelin oligodendrocyte glycoprotein antibody disease: Secondary | ICD-10-CM

## 2022-12-27 ENCOUNTER — Other Ambulatory Visit (INDEPENDENT_AMBULATORY_CARE_PROVIDER_SITE_OTHER): Payer: Self-pay

## 2022-12-27 DIAGNOSIS — Z79899 Other long term (current) drug therapy: Secondary | ICD-10-CM | POA: Diagnosis not present

## 2022-12-27 DIAGNOSIS — Z0289 Encounter for other administrative examinations: Secondary | ICD-10-CM

## 2022-12-27 DIAGNOSIS — G3781 Myelin oligodendrocyte glycoprotein antibody disease: Secondary | ICD-10-CM | POA: Diagnosis not present

## 2022-12-28 LAB — CBC WITH DIFFERENTIAL/PLATELET
Basophils Absolute: 0.1 10*3/uL (ref 0.0–0.2)
Basos: 1 %
EOS (ABSOLUTE): 0.2 10*3/uL (ref 0.0–0.4)
Eos: 2 %
Hematocrit: 39 % (ref 34.0–46.6)
Hemoglobin: 12.2 g/dL (ref 11.1–15.9)
Immature Grans (Abs): 0 10*3/uL (ref 0.0–0.1)
Immature Granulocytes: 0 %
Lymphocytes Absolute: 2.4 10*3/uL (ref 0.7–3.1)
Lymphs: 22 %
MCH: 29.3 pg (ref 26.6–33.0)
MCHC: 31.3 g/dL — ABNORMAL LOW (ref 31.5–35.7)
MCV: 94 fL (ref 79–97)
Monocytes Absolute: 0.9 10*3/uL (ref 0.1–0.9)
Monocytes: 8 %
Neutrophils Absolute: 7.2 10*3/uL — ABNORMAL HIGH (ref 1.4–7.0)
Neutrophils: 67 %
Platelets: 287 10*3/uL (ref 150–450)
RBC: 4.16 x10E6/uL (ref 3.77–5.28)
RDW: 12.2 % (ref 11.7–15.4)
WBC: 10.7 10*3/uL (ref 3.4–10.8)

## 2022-12-28 LAB — IGG, IGA, IGM
IgA/Immunoglobulin A, Serum: 323 mg/dL (ref 87–352)
IgG (Immunoglobin G), Serum: 1000 mg/dL (ref 586–1602)
IgM (Immunoglobulin M), Srm: 103 mg/dL (ref 26–217)

## 2023-01-05 DIAGNOSIS — I10 Essential (primary) hypertension: Secondary | ICD-10-CM | POA: Diagnosis not present

## 2023-01-05 DIAGNOSIS — I471 Supraventricular tachycardia, unspecified: Secondary | ICD-10-CM | POA: Diagnosis not present

## 2023-01-05 DIAGNOSIS — K5900 Constipation, unspecified: Secondary | ICD-10-CM | POA: Diagnosis not present

## 2023-01-11 ENCOUNTER — Telehealth: Payer: Self-pay | Admitting: Family Medicine

## 2023-01-11 NOTE — Telephone Encounter (Signed)
LVM and sent mychart msg informing pt of need to reschedule 01/27/23 appt - NP out

## 2023-01-19 DIAGNOSIS — I471 Supraventricular tachycardia, unspecified: Secondary | ICD-10-CM | POA: Diagnosis not present

## 2023-01-19 DIAGNOSIS — I10 Essential (primary) hypertension: Secondary | ICD-10-CM | POA: Diagnosis not present

## 2023-01-19 DIAGNOSIS — F411 Generalized anxiety disorder: Secondary | ICD-10-CM | POA: Diagnosis not present

## 2023-01-27 ENCOUNTER — Ambulatory Visit: Payer: BC Managed Care – PPO | Admitting: Family Medicine

## 2023-02-10 DIAGNOSIS — G4700 Insomnia, unspecified: Secondary | ICD-10-CM | POA: Diagnosis not present

## 2023-02-10 DIAGNOSIS — F331 Major depressive disorder, recurrent, moderate: Secondary | ICD-10-CM | POA: Diagnosis not present

## 2023-02-10 DIAGNOSIS — I10 Essential (primary) hypertension: Secondary | ICD-10-CM | POA: Diagnosis not present

## 2023-02-10 DIAGNOSIS — F411 Generalized anxiety disorder: Secondary | ICD-10-CM | POA: Diagnosis not present

## 2023-02-16 DIAGNOSIS — Z13220 Encounter for screening for lipoid disorders: Secondary | ICD-10-CM | POA: Diagnosis not present

## 2023-02-16 DIAGNOSIS — E782 Mixed hyperlipidemia: Secondary | ICD-10-CM | POA: Diagnosis not present

## 2023-02-16 DIAGNOSIS — I10 Essential (primary) hypertension: Secondary | ICD-10-CM | POA: Diagnosis not present

## 2023-02-16 DIAGNOSIS — Z0000 Encounter for general adult medical examination without abnormal findings: Secondary | ICD-10-CM | POA: Diagnosis not present

## 2023-02-17 DIAGNOSIS — G360 Neuromyelitis optica [Devic]: Secondary | ICD-10-CM | POA: Diagnosis not present

## 2023-02-21 DIAGNOSIS — R110 Nausea: Secondary | ICD-10-CM | POA: Diagnosis not present

## 2023-02-21 DIAGNOSIS — Z23 Encounter for immunization: Secondary | ICD-10-CM | POA: Diagnosis not present

## 2023-02-21 DIAGNOSIS — K219 Gastro-esophageal reflux disease without esophagitis: Secondary | ICD-10-CM | POA: Diagnosis not present

## 2023-02-21 DIAGNOSIS — Z0000 Encounter for general adult medical examination without abnormal findings: Secondary | ICD-10-CM | POA: Diagnosis not present

## 2023-02-22 DIAGNOSIS — K219 Gastro-esophageal reflux disease without esophagitis: Secondary | ICD-10-CM | POA: Diagnosis not present

## 2023-02-22 DIAGNOSIS — R109 Unspecified abdominal pain: Secondary | ICD-10-CM | POA: Diagnosis not present

## 2023-03-08 ENCOUNTER — Ambulatory Visit (INDEPENDENT_AMBULATORY_CARE_PROVIDER_SITE_OTHER): Payer: BC Managed Care – PPO | Admitting: Family Medicine

## 2023-03-08 ENCOUNTER — Encounter: Payer: Self-pay | Admitting: Family Medicine

## 2023-03-08 VITALS — BP 135/83 | HR 64 | Ht 66.0 in | Wt 144.0 lb

## 2023-03-08 DIAGNOSIS — E559 Vitamin D deficiency, unspecified: Secondary | ICD-10-CM

## 2023-03-08 DIAGNOSIS — G360 Neuromyelitis optica [Devic]: Secondary | ICD-10-CM

## 2023-03-08 DIAGNOSIS — G3781 Myelin oligodendrocyte glycoprotein antibody disease: Secondary | ICD-10-CM | POA: Diagnosis not present

## 2023-03-08 DIAGNOSIS — A048 Other specified bacterial intestinal infections: Secondary | ICD-10-CM | POA: Diagnosis not present

## 2023-03-08 DIAGNOSIS — R110 Nausea: Secondary | ICD-10-CM | POA: Diagnosis not present

## 2023-03-08 DIAGNOSIS — Z79899 Other long term (current) drug therapy: Secondary | ICD-10-CM | POA: Diagnosis not present

## 2023-03-08 DIAGNOSIS — K219 Gastro-esophageal reflux disease without esophagitis: Secondary | ICD-10-CM | POA: Diagnosis not present

## 2023-03-08 NOTE — Patient Instructions (Signed)
Below is our plan:  We will continue current treatment plan.   Please make sure you are staying well hydrated. I recommend 50-60 ounces daily. Well balanced diet and regular exercise encouraged. Consistent sleep schedule with 6-8 hours recommended.   Please continue follow up with care team as directed.   Follow up with Dr Epimenio Foot in 6 months   You may receive a survey regarding today's visit. I encourage you to leave honest feed back as I do use this information to improve patient care. Thank you for seeing me today!

## 2023-03-08 NOTE — Progress Notes (Signed)
Chief Complaint  Patient presents with   Room 2    Pt is here Alone. Pt states that she isn't feeling well today. Pt has H yplori and is on treatment for it and wants to know wether or not she can get her infusion tomorrow.     HISTORY OF PRESENT ILLNESS:  03/08/23 ALL:  Lauren [name] is a 57 y.o. female here today for follow up for NMO. She continues rituximab and tolerating well. Next infusion scheduled for tomorrow   She reports doing fairly well. No changes in gait. No trouble with balance. No assistive device needed. She can go up and down steps without difficulty.   She reports being diagnosed with H Pylori last week. She is on day 9 of 14 day course of metronidazole, tetracycline and Protonix. She continues to have significant acid reflux and stomach pain.   She works 3rd shift at [employer]. She is sleeping well. She usually goes to bed around 7am, and wakes around 12-1pm but then will lie back down around 2p and wakes around 6p. Mood is good. Memory is good.   Last eye exam was 05/2021. Vision is at baseline. No new symptoms.    HISTORY (copied from Dr Bonnita Hollow previous note)  Lauren [name], at the MS center at [facility] for neurologic consultation regarding her transverse myelitis and recent diagnosis of anti-MOG positive demyelination.   07/26/2022 She has been on Rituximab and her next infusion is March 2024      She tolerates it well.   MOGAD is doing well - no exacerbations   Gait is doing well.   She walks at work the entire night (third shift at [employer] as [employer]).   Balance is near baseline. And she goes up and down ladders.    She denies any new weakness.   She denies any change in bladder functions.  She denies any numbness now.  She does note some knee pain on the right.     Bladder function is fine.     Vision is back to baseline and symmetric colors.    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  She sees optometry annually.      Fatigue is not  much of a problem.    She sleeps well most days (third shift work).     Cognition is fine.  She denies depression or anxiety.         NMOSD History: She had transverse myelitis 2 days after receiving the first Pfizer vaccination 09/18/2019.   On 09/20/2019, her left leg went numb.  Over the next 2 days, the right leg started to get numb and the left leg numbness worsen and she became weaker.   She was able to walk though she felt she had a limp.  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  She did not have any numbness or weakness in the arms.  Because symptoms persisted, she went to Urgent care 09/28/19 and had standard labs that were fine.  She was prescribed 20 mg prednisone x 5 days and advised to call back if not better.  Symptoms persisted and she was referred to Dr. Sherryll Burger..  He saw her 10/15/19 and then sent her that day to [facility] due to Brown-Sequard like symptoms.   She was seen in the emergency room and then admitted.  She was treated with IV Solumedrol for 5 days.   She had a lumbar  puncture and her CSF showed oligoclonal bands (9).  Additionally, she had testing for neuromyelitis optica and is anti-MOG positive.  She is anti-NMO negative.   The anti-Mog antibody test had not returned yet when she was discharged.  She followed up with Dr. Sherryll Burger and is referred for possible DMT therapy.    She received 5 days IV Solumedrol with improvement.   She had the onset of right eye pain about a week ago, worse with movements and over the next couple days she lost vision in both eyes, right worse than left, consistent with optic neuritis..  At the visit, 01/10/2020, she had perception of shapes but could not count fingers in the majority of the visual field and was able to count fingers in the upper nasal quadrant.  On the left, she had mildly reduced vision but could read.  She received 4 days of IV Solu-Medrol and vision improved over the next couple weeks and has continued to  improve towards baseline.   Imaging: MRI of the cervical and thoracic spine 10/16/2019 showed an enhancing lesion to the left of midline from T10 to T11.  The cervical spine was normal.   MRI of the brain showed some nonspecific white matter foci in the subcortical and deep white matter but no acute findings.   Laboratory test: 01/10/2020: Hepatitis B core and surface antibodies were negative 10/16/2019: CSF showed 9 oligoclonal bands unique to the CSF. 10/17/2019: Anti-NMO was negative and anti-MOG was positive 1:100 ([facility]).  ESR and ANA were negative.  CCP IgG/IgAwas negative.  Quant TB negative   REVIEW OF SYSTEMS: Out of a complete 14 system review of symptoms, the patient complains only of the following symptoms, intermittent right knee pain, and all other reviewed systems are negative.   ALLERGIES: Allergies  Allergen Reactions   Penicillins Anaphylaxis, Hives and Swelling   Other Other (See Comments) and Swelling    Banana - Swelling and hives. Banana - Swelling and hives.     HOME MEDICATIONS: Outpatient Medications Prior to Visit  Medication Sig Dispense Refill   diltiazem (CARDIZEM CD) 360 MG 24 hr capsule Take 1 capsule (360 mg total) by mouth daily. 90 capsule 3   diltiazem (CARDIZEM) 30 MG tablet Take 1 tablet (30 mg total) by mouth 4 (four) times daily as needed (palpitations/fast heart rate). 60 tablet 3   metroNIDAZOLE (FLAGYL) 500 MG tablet Take 500 mg by mouth 2 (two) times daily. For 14 days     omeprazole (PRILOSEC) 40 MG capsule Take 40 mg by mouth daily.     rosuvastatin (CRESTOR) 5 MG tablet Take 5 mg by mouth at bedtime.     tetracycline (SUMYCIN) 500 MG capsule Take 500 mg by mouth every 6 (six) hours. For 14 days     linaclotide (LINZESS) 72 MCG capsule Take 72 mcg by mouth daily before breakfast.     lisinopril (ZESTRIL) 40 MG tablet TAKE 1 TABLET BY MOUTH EVERY DAY 90 tablet 3   Multiple Vitamins-Minerals (CENTRUM SILVER 50+WOMEN PO) Take by mouth.      Wheat Dextrin (BENEFIBER PO) Take 2 Scoops by mouth daily. (Patient not taking: Reported on 03/08/2023)     No facility-administered medications prior to visit.     PAST MEDICAL HISTORY: Past Medical History:  Diagnosis Date   Allergy    seasonal   Boils 12/15/2010   multiple axilla   Carpal tunnel syndrome of left wrist    Chronic headaches 2008    improving  Constipation    Constipation 03/19/2015   DDD (degenerative disc disease), cervical    De Quervain's disease (tenosynovitis)    Right per pt   Encounter for Papanicolaou smear for cervical cancer screening 12/08/2016   Epigastric pain 09/21/2017   Heart murmur    History of Heart murmur as child.   Heart palpitations    Heartburn    Occ   HLD (hyperlipidemia) 2008   Hypertension    Menorrhagia    OA (osteoarthritis) of neck    Palpable abdominal aorta 09/21/2017   Right hip pain    Right ovarian cyst 11/16/2009   2 small, noted on pelvic US   Seizures (HCC)    history of one time   Shoulder pain, right 07/2008   Ventral hernia    Weight loss 12/30/2017     PAST SURGICAL HISTORY: Past Surgical History:  Procedure Laterality Date   COLONOSCOPY W/ POLYPECTOMY  12/24/2015   INSERTION OF MESH N/A 10/25/2017   Procedure: INSERTION OF MESH;  Surgeon: Sheliah Hatch De Blanch, MD;  Location: [facility];  Service: General;  Laterality: N/A;   LEFT HEART CATH AND CORONARY ANGIOGRAPHY N/A 08/06/2020   Procedure: LEFT HEART CATH AND CORONARY ANGIOGRAPHY;  Surgeon: Corky Crafts, MD;  Location: [facility] INVAS[facility] CV [facility];  Service: Cardiovascular;  Laterality: N/A;   VENTRAL HERNIA REPAIR N/A 10/25/2017   Procedure: LAPAROSCOPIC VENTRAL HERNIA;  Surgeon: Sheliah Hatch, De Blanch, MD;  Location: [facility];  Service: General;  Laterality: N/A;     FAMILY HISTORY: Family History  Problem Relation Age of Onset   Brain cancer Mother    Heart failure Father    Cirrhosis Brother        alcohol related    Heart disease Maternal Grandmother    Breast cancer Neg Hx    Colon cancer Neg Hx    Esophageal cancer Neg Hx    Stomach cancer Neg Hx      SOCIAL HISTORY: Social History   Socioeconomic History   Marital status: Media planner    Spouse name: Not on file   Number of children: Not on file   Years of education: Not on file   Highest education level: Not on file  Occupational History   Not on file  Tobacco Use   Smoking status: Former    Current packs/day: 0.30    Average packs/day: 0.3 packs/day for 36.0 years (10.8 ttl pk-yrs)    Types: Cigarettes   Smokeless tobacco: Never   Tobacco comments:    1/2 pack per day  Vaping Use   Vaping status: Never Used  Substance and Sexual Activity   Alcohol use: Yes    Alcohol/week: 0.0 standard drinks of alcohol    Comment: seldom   Drug use: Not Currently    Types: Marijuana    Comment: 2-3 times per week for pain   Sexual activity: Yes    Birth control/protection: Post-menopausal  Other Topics Concern   Not on file  Social History Narrative   Works as Government social research officer at [employer], lives with significant other in [place]   No EToh, former beer drinker   No illicit drug use.   Current smoker, 1/2 PPDx 26 years   Caffeine use: none   Right handed    Social Determinants of Health   Financial Resource Strain: Not on file  Food Insecurity: Not on file  Transportation Needs: Not on file  Physical Activity: Not on file  Stress: Not on file  Social Connections: Not on file  Intimate Partner Violence: Not on file     PHYSICAL EXAM  Vitals:   03/08/23 1120  BP: 135/83  Pulse: 64  Weight: 144 lb (65.3 kg)  Height: 5\' 6"  (1.676 m)    Body mass index is 23.24 kg/m.  Generalized: Well developed, in no acute distress  Cardiology: normal rate and rhythm, no murmur auscultated  Respiratory: clear to auscultation bilaterally    Neurological examination  Mentation: Alert oriented to time, place,  history taking. Follows all commands speech and language fluent Cranial nerve II-XII: Pupils were equal round reactive to light. Extraocular movements were full, visual field were full on confrontational test with exception of right upper quad. Facial sensation and strength were normal. Head turning and shoulder shrug  were normal and symmetric. Motor: The motor testing reveals 5 over 5 strength of all 4 extremities. Good symmetric motor tone is noted throughout.  Sensory: Sensory testing is intact to soft touch on all 4 extremities. No evidence of extinction is noted.  Coordination: Cerebellar testing reveals good finger-nose-finger and heel-to-shin bilaterally.  Gait and station: Gait is normal.  Reflexes: Deep tendon reflexes are symmetric and normal bilaterally.    DIAGNOSTIC DATA (LABS, IMAGING, TESTING) - I reviewed patient records, labs, notes, testing and imaging myself where available.  Lab Results  Component Value Date   WBC 10.7 12/27/2022   HGB 12.2 12/27/2022   HCT 39.0 12/27/2022   MCV 94 12/27/2022   PLT 287 12/27/2022      Component Value Date/Time   NA 139 02/18/2021 1708   NA 140 07/29/2020 1016   K 4.2 02/18/2021 1708   CL 107 02/18/2021 1708   CO2 23 02/18/2021 1708   GLUCOSE 102 (H) 02/18/2021 1708   BUN 15 02/18/2021 1708   BUN 10 07/29/2020 1016   CREATININE 0.65 02/18/2021 1708   CREATININE 0.76 06/01/2013 1120   CALCIUM 9.3 02/18/2021 1708   PROT 7.2 02/18/2021 1708   PROT 7.8 12/28/2017 1614   ALBUMIN 4.0 02/18/2021 1708   ALBUMIN 4.3 12/28/2017 1614   AST 27 02/18/2021 1708   ALT 23 02/18/2021 1708   ALKPHOS 53 02/18/2021 1708   BILITOT 0.6 02/18/2021 1708   BILITOT 0.2 12/28/2017 1614   GFRNONAA >60 02/18/2021 1708   GFRNONAA >89 06/01/2013 1120   GFRAA 108 07/29/2020 1016   GFRAA >89 06/01/2013 1120   Lab Results  Component Value Date   CHOL 158 10/08/2015   HDL 41 10/08/2015   LDLCALC 101 (H) 10/08/2015   TRIG 78 10/08/2015   CHOLHDL  3.9 10/08/2015   No results found for: "HGBA1C" Lab Results  Component Value Date   VITAMINB12 >2000 (H) 07/29/2020   Lab Results  Component Value Date   TSH 0.763 09/28/2019        No data to display               No data to display           ASSESSMENT AND PLAN  57 y.o. year old female  has a past medical history of Allergy, Boils (12/15/2010), Carpal tunnel syndrome of left wrist, Chronic headaches (2008), Constipation, Constipation (03/19/2015), DDD (degenerative disc disease), cervical, De Quervain's disease (tenosynovitis), Encounter for Papanicolaou smear for cervical cancer screening (12/08/2016), Epigastric pain (09/21/2017), Heart murmur, Heart palpitations, Heartburn, HLD (hyperlipidemia) (2008), Hypertension, Menorrhagia, OA (osteoarthritis) of neck, Palpable abdominal aorta (09/21/2017), Right hip pain, Right ovarian cyst (11/16/2009), Seizures (  HCC), Shoulder pain, right (07/2008), Ventral hernia, and Weight loss (12/30/2017). here with    Neuromyelitis optica (devic) (HCC)  MOG antibody disease  High risk medication use  Vitamin D deficiency  Lauren [name] is doing very well. We will continue rituximab infusions every 6 months. Will reschedule infusion tomorrow and reschedule for after H Pylori treatment is completed. Labs reviewed in Epic 12/2022. She will continue vitamin D OTC. Healthy lifestyle habits encouraged. She will follow up with PCP as directed. She will return to see Dr Epimenio Foot in 6 months, sooner if needed. She verbalizes understanding and agreement with this plan.   No orders of the defined types were placed in this encounter.    No orders of the defined types were placed in this encounter.    Shawnie Dapper, MSN, FNP-C 03/08/2023, 11:34 AM  [facility] [address], [address] [place], [place] [address] [phone]

## 2023-03-09 ENCOUNTER — Telehealth: Payer: Self-pay | Admitting: Family Medicine

## 2023-03-09 NOTE — Telephone Encounter (Signed)
Provided message to intrafusion.

## 2023-03-09 NOTE — Telephone Encounter (Signed)
-----   Message from Asa Lente sent at 03/08/2023  5:25 PM EDT ----- We will have her do her infusions after she completes her antibiotic course ----- Message ----- From: Shawnie Dapper, NP Sent: 03/08/2023  11:37 AM EDT To: Asa Lente, MD  She received first of two infusions 9/11 and was scheduled again, tomorrow. She is on day 9 of 14 of metronidazole, tetracycline and pantoprazole. She requested to reschedule infusion tomorrow. Is there a specific time line I need to stay from abx use?

## 2023-03-09 NOTE — Telephone Encounter (Signed)
Can you guys please alert Kim in infusion and Mrs Corts that Dr Epimenio Foot said we should complete abx course prior to next rituximab infusion. I believe this is day 10 of 14 so she should be able to schedule for next week. TY!

## 2023-03-16 DIAGNOSIS — K219 Gastro-esophageal reflux disease without esophagitis: Secondary | ICD-10-CM | POA: Diagnosis not present

## 2023-03-16 DIAGNOSIS — A048 Other specified bacterial intestinal infections: Secondary | ICD-10-CM | POA: Diagnosis not present

## 2023-04-01 DIAGNOSIS — I10 Essential (primary) hypertension: Secondary | ICD-10-CM | POA: Diagnosis not present

## 2023-04-01 DIAGNOSIS — K219 Gastro-esophageal reflux disease without esophagitis: Secondary | ICD-10-CM | POA: Diagnosis not present

## 2023-04-07 DIAGNOSIS — G360 Neuromyelitis optica [Devic]: Secondary | ICD-10-CM | POA: Diagnosis not present

## 2023-05-23 ENCOUNTER — Telehealth: Payer: Self-pay | Admitting: *Deleted

## 2023-08-05 ENCOUNTER — Other Ambulatory Visit: Payer: Self-pay | Admitting: Family Medicine

## 2023-08-05 DIAGNOSIS — Z1231 Encounter for screening mammogram for malignant neoplasm of breast: Secondary | ICD-10-CM

## 2023-09-08 ENCOUNTER — Ambulatory Visit (INDEPENDENT_AMBULATORY_CARE_PROVIDER_SITE_OTHER): Payer: BC Managed Care – PPO | Admitting: Neurology

## 2023-09-08 ENCOUNTER — Encounter: Payer: Self-pay | Admitting: Neurology

## 2023-09-08 VITALS — BP 118/70 | HR 67 | Ht 66.0 in | Wt 147.0 lb

## 2023-09-08 DIAGNOSIS — G3781 Myelin oligodendrocyte glycoprotein antibody disease: Secondary | ICD-10-CM

## 2023-09-08 DIAGNOSIS — Z8669 Personal history of other diseases of the nervous system and sense organs: Secondary | ICD-10-CM | POA: Diagnosis not present

## 2023-09-08 DIAGNOSIS — Z8661 Personal history of infections of the central nervous system: Secondary | ICD-10-CM | POA: Diagnosis not present

## 2023-09-08 DIAGNOSIS — Z79899 Other long term (current) drug therapy: Secondary | ICD-10-CM

## 2023-09-08 NOTE — Progress Notes (Signed)
 [facility]  PATIENT: Lauren [name] DOB: [birth year]  REFERRING DOCTOR OR PCP:  Maryelizabeth Rowan SOURCE:   _________________________________   HISTORICAL  CHIEF COMPLAINT:  Chief Complaint  Patient presents with   Follow-up    Pt in 11 aloine Pt states no questions or concerns for todays visit     HISTORY OF PRESENT ILLNESS:  Lauren [name], at the MS center at [facility] for neurologic consultation regarding her transverse myelitis and recent diagnosis of anti-MOG positive demyelination.  Update 09/08/2023 She has been on Rituximab and her next infusion is 09/15/2023     She tolerates it well.   No infections.   MOGAD is doing well - no exacerbations when last checked, the anti-MOG antibodies were down to nondetectable.  B cells were at 0% right before her second to last infusion  Gait is doing well.   She walks at work the entire night (third shift at [employer] as [employer]).   Balance is near baseline. And she goes up and down ladders without concerns.    She denies any new weakness.   She denies any change in bladder functions.  She denies any numbness now.  She does note some knee pain on the right.     Bladder function is fine.     Vision is back to baseline and symmetric colors.   She sees optometry annually.     Fatigue is not much of a problem.    She sleeps well most days (third shift work).     Cognition is fine.  She denies depression or anxiety.      MOGAD History: She had transverse myelitis 2 days after receiving the first Pfizer vaccination 09/18/2019.   On 09/20/2019, her left leg went numb.  Over the next 2 days, the right leg started to get numb and the left leg numbness worsen and she became weaker.   She was able to walk though she felt she had a limp.  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  She did not have any numbness or weakness in the arms.  Because symptoms persisted, she went to  Urgent care 09/28/19 and had standard labs that were fine.  She was prescribed 20 mg prednisone x 5 days and advised to call back if not better.  Symptoms persisted and she was referred to Dr. Sherryll Burger..  He saw her 10/15/19 and then sent her that day to [facility] due to Brown-Sequard like symptoms.   She was seen in the emergency room and then admitted.  She was treated with IV Solumedrol for 5 days.   She had a lumbar puncture and her CSF showed oligoclonal bands (9).  Additionally, she had testing for neuromyelitis optica and is anti-MOG positive.  She is anti-NMO negative.   The anti-Mog antibody test had not returned yet when she was discharged.  She followed up with Dr. Sherryll Burger and is referred for possible DMT therapy.    She received 5 days IV Solumedrol with improvement.   She had the onset of right eye pain about a week ago, worse with movements and over the next couple days she lost vision in both eyes, right worse than left, consistent with optic neuritis..  At the visit, 01/10/2020, she had perception of shapes but could not count fingers in the majority of the visual field and was able to count fingers in the upper nasal quadrant.  On the left, she  had mildly reduced vision but could read.  She received 4 days of IV Solu-Medrol and vision improved over the next couple weeks and has continued to improve towards baseline.  Imaging: MRI of the cervical and thoracic spine 10/16/2019 showed an enhancing lesion to the left of midline from T10 to T11.  The cervical spine was normal.  MRI of the brain showed some nonspecific white matter foci in the subcortical and deep white matter but no acute findings.  Laboratory test: 01/10/2020: Hepatitis B core and surface antibodies were negative 10/16/2019: CSF showed 9 oligoclonal bands unique to the CSF. 10/17/2019: Anti-NMO was negative and anti-MOG was positive 1:100 ([facility]).  ESR and ANA were negative.  CCP IgG/IgAwas negative.  Quant TB negative    REVIEW OF  SYSTEMS: Constitutional: No fevers, chills, sweats, or change in appetite Eyes: No visual changes, double vision, eye pain Ear, nose and throat: No hearing loss, ear pain, nasal congestion, sore throat Cardiovascular: No chest pain, palpitations Respiratory:  No shortness of breath at rest or with exertion.   No wheezes GastrointestinaI: No nausea, vomiting, diarrhea, abdominal pain, fecal incontinence Genitourinary:  No dysuria, urinary retention or frequency.  No nocturia. Musculoskeletal:  No neck pain, back pain Integumentary: No rash, pruritus, skin lesions Neurological: as above Psychiatric: No depression at this time.  No anxiety Endocrine: No palpitations, diaphoresis, change in appetite, change in weigh or increased thirst Hematologic/Lymphatic:  No anemia, purpura, petechiae. Allergic/Immunologic: No itchy/runny eyes, nasal congestion, recent allergic reactions, rashes  ALLERGIES: Allergies  Allergen Reactions   Penicillins Anaphylaxis, Hives and Swelling   Other Other (See Comments) and Swelling    Banana - Swelling and hives. Banana - Swelling and hives.    HOME MEDICATIONS:  Current Outpatient Medications:    diltiazem (CARDIZEM CD) 360 MG 24 hr capsule, Take 1 capsule (360 mg total) by mouth daily., Disp: 90 capsule, Rfl: 3   diltiazem (CARDIZEM) 30 MG tablet, Take 1 tablet (30 mg total) by mouth 4 (four) times daily as needed (palpitations/fast heart rate)., Disp: 60 tablet, Rfl: 3   rosuvastatin (CRESTOR) 5 MG tablet, Take 5 mg by mouth at bedtime., Disp: , Rfl:    Wheat Dextrin (BENEFIBER PO), Take 2 Scoops by mouth as needed., Disp: , Rfl:    metroNIDAZOLE (FLAGYL) 500 MG tablet, Take 500 mg by mouth 2 (two) times daily. For 14 days, Disp: , Rfl:    omeprazole (PRILOSEC) 40 MG capsule, Take 40 mg by mouth daily., Disp: , Rfl:    tetracycline (SUMYCIN) 500 MG capsule, Take 500 mg by mouth every 6 (six) hours. For 14 days, Disp: , Rfl:   PAST MEDICAL HISTORY: Past  Medical History:  Diagnosis Date   Allergy    seasonal   Boils 12/15/2010   multiple axilla   Carpal tunnel syndrome of left wrist    Chronic headaches 2008    improving   Constipation    Constipation 03/19/2015   DDD (degenerative disc disease), cervical    De Quervain's disease (tenosynovitis)    Right per pt   Encounter for Papanicolaou smear for cervical cancer screening 12/08/2016   Epigastric pain 09/21/2017   Heart murmur    History of Heart murmur as child.   Heart palpitations    Heartburn    Occ   HLD (hyperlipidemia) 2008   Hypertension    Menorrhagia    OA (osteoarthritis) of neck    Palpable abdominal aorta 09/21/2017   Right hip pain  Right ovarian cyst 11/16/2009   2 small, noted on pelvic US   Seizures (HCC)    history of one time   Shoulder pain, right 07/2008   Ventral hernia    Weight loss 12/30/2017    PAST SURGICAL HISTORY: Past Surgical History:  Procedure Laterality Date   COLONOSCOPY W/ POLYPECTOMY  12/24/2015   INSERTION OF MESH N/A 10/25/2017   Procedure: INSERTION OF MESH;  Surgeon: Sheliah Hatch De Blanch, MD;  Location: [facility];  Service: General;  Laterality: N/A;   LEFT HEART CATH AND CORONARY ANGIOGRAPHY N/A 08/06/2020   Procedure: LEFT HEART CATH AND CORONARY ANGIOGRAPHY;  Surgeon: Corky Crafts, MD;  Location: [facility] INVASIVE CV LAB;  Service: Cardiovascular;  Laterality: N/A;   VENTRAL HERNIA REPAIR N/A 10/25/2017   Procedure: LAPAROSCOPIC VENTRAL HERNIA;  Surgeon: Sheliah Hatch, De Blanch, MD;  Location: [facility];  Service: General;  Laterality: N/A;    FAMILY HISTORY: Family History  Problem Relation Age of Onset   Brain cancer Mother    Heart failure Father    Cirrhosis Brother        alcohol related   Heart disease Maternal Grandmother    Breast cancer Neg Hx    Colon cancer Neg Hx    Esophageal cancer Neg Hx    Stomach cancer Neg Hx     SOCIAL HISTORY:  Social History   Socioeconomic  History   Marital status: Media planner    Spouse name: Not on file   Number of children: Not on file   Years of education: Not on file   Highest education level: Not on file  Occupational History   Not on file  Tobacco Use   Smoking status: Former    Current packs/day: 0.30    Average packs/day: 0.3 packs/day for 36.0 years (10.8 ttl pk-yrs)    Types: Cigarettes   Smokeless tobacco: Never   Tobacco comments:    1/2 pack per day  Vaping Use   Vaping status: Never Used  Substance and Sexual Activity   Alcohol use: Yes    Alcohol/week: 0.0 standard drinks of alcohol    Comment: seldom   Drug use: Not Currently    Types: Marijuana    Comment: 2-3 times per week for pain   Sexual activity: Yes    Birth control/protection: Post-menopausal  Other Topics Concern   Not on file  Social History Narrative   Works as Government social research officer at [employer], lives with significant other in [place]   No EToh, former beer drinker   No illicit drug use.   Current smoker, 1/2 PPDx 26 years   Caffeine use: none   Right handed    Social Drivers of Corporate investment banker Strain: Not on file  Food Insecurity: Not on file  Transportation Needs: Not on file  Physical Activity: Not on file  Stress: Not on file  Social Connections: Not on file  Intimate Partner Violence: Not on file     PHYSICAL EXAM  Vitals:   09/08/23 1055  BP: 118/70  Pulse: 67  SpO2: 97%  Weight: 147 lb (66.7 kg)  Height: 5\' 6"  (1.676 m)    Body mass index is 23.73 kg/m.  VA 20/30 OD VA 20/50 OS  General: The patient is well-developed and well-nourished and in no acute distress  HEENT:  Head is [place]/AT.  Sclera are anicteri  Skin: Extremities are without rash or  edema.   Neurologic Exam  Mental status: The patient is alert and oriented x 3 at the time of the examination. The patient has apparent normal recent and remote memory, with an apparently normal attention span and  concentration ability.   Speech is normal.  Cranial nerves: Extraocular movements are full.  She has mild ptosis, right greater than left.  Color vision was symmetric.   Visual fields now FTC.  Facial strength and sensation were fine.   No obvious hearing deficits are noted.  Motor:  Muscle bulk is normal.   Tone is Normal.  Strength is 5/5 now.    Sensory: Sensory testing is intact to pinprick, soft touch and vibration sensation in the arms and legs  Coordination: Cerebellar testing reveals good finger-nose-finger and heel-to-shin bilaterally   Gait and station: Station is normal.   She has normal gait though tandem is mildly wide.   Romberg is negative     Reflexes: Deep tendon reflexes are symmetric and normal bilaterally.       DIAGNOSTIC DATA (LABS, IMAGING, TESTING) - I reviewed patient records, labs, notes, testing and imaging myself where available.  Lab Results  Component Value Date   WBC 10.7 12/27/2022   HGB 12.2 12/27/2022   HCT 39.0 12/27/2022   MCV 94 12/27/2022   PLT 287 12/27/2022      Component Value Date/Time   NA 139 02/18/2021 1708   NA 140 07/29/2020 1016   K 4.2 02/18/2021 1708   CL 107 02/18/2021 1708   CO2 23 02/18/2021 1708   GLUCOSE 102 (H) 02/18/2021 1708   BUN 15 02/18/2021 1708   BUN 10 07/29/2020 1016   CREATININE 0.65 02/18/2021 1708   CREATININE 0.76 06/01/2013 1120   CALCIUM 9.3 02/18/2021 1708   PROT 7.2 02/18/2021 1708   PROT 7.8 12/28/2017 1614   ALBUMIN 4.0 02/18/2021 1708   ALBUMIN 4.3 12/28/2017 1614   AST 27 02/18/2021 1708   ALT 23 02/18/2021 1708   ALKPHOS 53 02/18/2021 1708   BILITOT 0.6 02/18/2021 1708   BILITOT 0.2 12/28/2017 1614   GFRNONAA >60 02/18/2021 1708   GFRNONAA >89 06/01/2013 1120   GFRAA 108 07/29/2020 1016   GFRAA >89 06/01/2013 1120   Lab Results  Component Value Date   CHOL 158 10/08/2015   HDL 41 10/08/2015   LDLCALC 101 (H) 10/08/2015   TRIG 78 10/08/2015   CHOLHDL 3.9 10/08/2015   No results  found for: "HGBA1C" Lab Results  Component Value Date   VITAMINB12 >2000 (H) 07/29/2020   Lab Results  Component Value Date   TSH 0.763 09/28/2019       ASSESSMENT AND PLAN  No diagnosis found.   1.   She has not had any more episodes of transverse myelitis or optic neuritis.  She has anti-MOG antibodies c/w MOGAD  . Marland Kitchen    Interestingly, she does have oligoclonal bands.  This was seen in about 13% of adults (Jarius 2020), usually during a relapse, in 1 study of anti-MOG  .  She has done very well on rituximab and should continue for the time being.   There is still a possibility that her disease was monophasic at that following the vaccination and we will continue to reassess as new information becomes available. 2.  Check IgG/IgM, CBC with differential and CD19/CD20.  We will recheck anti-MOG. 3.  Return in 6 months.    Will need IgG/IgM and CBC/D at that time   Teiana Hajduk A. Epimenio Foot, MD, [facility] 09/08/2023, 11:05 AM Certified in  Neurology, Clinical Neurophysiology, Sleep Medicine and Neuroimaging  [facility] [address], [address] [place], [place] [address] [phone]

## 2023-09-10 LAB — CD20 B CELLS
% CD19-B Cells: 0 % — ABNORMAL LOW (ref 4.6–22.1)
% CD20-B Cells: 0 % — ABNORMAL LOW (ref 5.0–22.3)

## 2023-09-10 LAB — IGG, IGA, IGM
IgA/Immunoglobulin A, Serum: 303 mg/dL (ref 87–352)
IgG (Immunoglobin G), Serum: 1017 mg/dL (ref 586–1602)
IgM (Immunoglobulin M), Srm: 100 mg/dL (ref 26–217)

## 2023-09-11 ENCOUNTER — Encounter: Payer: Self-pay | Admitting: Neurology

## 2023-09-12 ENCOUNTER — Emergency Department (HOSPITAL_COMMUNITY)

## 2023-09-12 ENCOUNTER — Encounter (HOSPITAL_COMMUNITY): Payer: Self-pay | Admitting: *Deleted

## 2023-09-12 ENCOUNTER — Emergency Department (HOSPITAL_COMMUNITY)
Admission: EM | Admit: 2023-09-12 | Discharge: 2023-09-12 | Disposition: A | Discharge status: Home / Self Care | Attending: Emergency Medicine | Admitting: Emergency Medicine

## 2023-09-12 ENCOUNTER — Other Ambulatory Visit: Payer: Self-pay

## 2023-09-12 DIAGNOSIS — E876 Hypokalemia: Secondary | ICD-10-CM | POA: Diagnosis not present

## 2023-09-12 DIAGNOSIS — R002 Palpitations: Secondary | ICD-10-CM

## 2023-09-12 LAB — BASIC METABOLIC PANEL WITH GFR
Anion gap: 10 (ref 5–15)
BUN: 12 mg/dL (ref 6–20)
CO2: 25 mmol/L (ref 22–32)
Calcium: 9.4 mg/dL (ref 8.9–10.3)
Chloride: 105 mmol/L (ref 98–111)
Creatinine, Ser: 0.68 mg/dL (ref 0.44–1.00)
GFR, Estimated: >60 mL/min (ref >60)
Glucose, Bld: 103 mg/dL — ABNORMAL HIGH (ref 70–99)
Potassium: 3.1 mmol/L — ABNORMAL LOW (ref 3.5–5.1)
Sodium: 140 mmol/L (ref 135–145)

## 2023-09-12 LAB — CBC
HCT: 38.7 % (ref 36.0–46.0)
Hemoglobin: 12.4 g/dL (ref 12.0–15.0)
MCH: 29.5 pg (ref 26.0–34.0)
MCHC: 32 g/dL (ref 30.0–36.0)
MCV: 91.9 fL (ref 80.0–100.0)
Platelets: 264 10*3/uL (ref 150–400)
RBC: 4.21 MIL/uL (ref 3.87–5.11)
RDW: 13.3 % (ref 11.5–15.5)
WBC: 9 10*3/uL (ref 4.0–10.5)
nRBC: 0 % (ref 0.0–0.2)

## 2023-09-12 LAB — TROPONIN I (HIGH SENSITIVITY)
Troponin I (High Sensitivity): 5 ng/L (ref <18)
Troponin I (High Sensitivity): 4 ng/L (ref <18)

## 2023-09-12 LAB — TSH: TSH: 1.335 u[IU]/mL (ref 0.350–4.500)

## 2023-09-12 MED ORDER — POTASSIUM CHLORIDE CRYS ER 20 MEQ PO TBCR
40.0000 meq | EXTENDED_RELEASE_TABLET | Freq: Once | ORAL | Status: AC
  Administered 2023-09-12: 40 meq via ORAL
  Filled 2023-09-12: qty 2

## 2023-09-12 NOTE — ED Provider Notes (Signed)
 [place] EMERGENCY DEPARTMENT AT [facility] Provider Note   CSN: [id number] Arrival date & time: 09/12/23  9528     History Chief Complaint  Patient presents with   Irregular Heart Beat    Lauren [name] is a 58 y.o. female with history of SVT on diltiazem presents emerged from today for evaluation of palpitations.  She denies any chest pain during this.  Reports she does only have some shortness of breath with the episode.  Episodes are momentary and then relieve themselves.  She reports that she has had these palpitations for years however she has felt some become more frequent over the past 3 to 4 days.  She denies any leg swelling.  Denies any caffeine or alcohol consumption.  Denies any drug use.  Denies any recent flulike symptoms or illnesses.  Denies any fevers.  She recently had her diltiazem dose increased from 3 60-400 along 3 weeks ago.  She also had her valsartan cut in half from 1 60-80.  She does have diltiazem 20s to take for breakthrough however has not tried this given that her provider told her to try to hold off on those as to not drop her blood pressure too much.  She denies any lightheadedness or syncope.  Denies any room having sensation.  Denies any trouble walking or talking.  Palpitations happen at random and there is no exacerbating or relieving factors.  HPI     Home Medications Prior to Admission medications   Medication Sig Start Date End Date Taking? Authorizing Provider  diltiazem (CARDIZEM CD) 360 MG 24 hr capsule Take 1 capsule (360 mg total) by mouth daily. 04/03/21   Allred, Fayrene Fearing, MD  diltiazem (CARDIZEM) 30 MG tablet Take 1 tablet (30 mg total) by mouth 4 (four) times daily as needed (palpitations/fast heart rate). 01/19/21   Dyann Kief, PA-C  rosuvastatin (CRESTOR) 5 MG tablet Take 5 mg by mouth at bedtime.    [provider]  Wheat Dextrin (BENEFIBER PO) Take 2 Scoops by mouth as needed.    [provider]       Allergies    Penicillins and Other    Review of Systems   Review of Systems  Constitutional:  Negative for chills and fever.  HENT:  Negative for congestion.   Respiratory:  Positive for shortness of breath. Negative for cough.   Cardiovascular:  Positive for palpitations. Negative for chest pain and leg swelling.  Gastrointestinal:  Negative for abdominal pain, diarrhea and vomiting.  Neurological:  Negative for weakness, light-headedness and headaches.    Physical Exam Updated Vital Signs BP (!) 159/81 (BP Location: Right Arm)   Pulse 83   Temp 98.2 F (36.8 C)   Resp 14   Ht 5\' 6"  (1.676 m)   Wt 64.4 kg   LMP 08/09/2012   SpO2 100%   BMI 22.92 kg/m  Physical Exam Vitals and nursing note reviewed.  Constitutional:      General: She is not in acute distress.    Appearance: She is not ill-appearing or toxic-appearing.     Comments: Sitting on stretcher no acute distress  HENT:     Mouth/Throat:     Mouth: Mucous membranes are moist.  Eyes:     General: No scleral icterus. Cardiovascular:     Rate and Rhythm: Normal rate.     Pulses: Normal pulses.          Radial pulses are 2+ on the right side and  2+ on the left side.       Dorsalis pedis pulses are 2+ on the right side and 2+ on the left side.       Posterior tibial pulses are 2+ on the right side and 2+ on the left side.  Pulmonary:     Effort: Pulmonary effort is normal. No respiratory distress.     Breath sounds: Normal breath sounds.  Abdominal:     Tenderness: There is no abdominal tenderness.  Musculoskeletal:     Right lower leg: No edema.     Left lower leg: No edema.  Skin:    General: Skin is warm and dry.  Neurological:     General: No focal deficit present.     Mental Status: She is alert.     ED Results / Procedures / Treatments   Labs (all labs ordered are listed, but only abnormal results are displayed) Labs Reviewed  BASIC METABOLIC PANEL WITH GFR - Abnormal; Notable for the  following components:      Result Value   Potassium 3.1 (*)    Glucose, Bld 103 (*)    All other components within normal limits  CBC    EKG EKG Interpretation Date/Time:  Monday September 12 2023 09:47:08 EDT Ventricular Rate:  72 PR Interval:  204 QRS Duration:  84 QT Interval:  396 QTC Calculation: 434 R Axis:   55  Text Interpretation: Sinus rhythm Atrial premature complex Borderline prolonged PR interval Confirmed by Alona Bene [phone]) on 09/13/2023 8:02:32 PM  Radiology DG Chest 1 View Result Date: 09/12/2023 CLINICAL DATA:  Shortness of breath, atrial fibrillation, and dizziness EXAM: CHEST  1 VIEW COMPARISON:  Chest radiograph dated 02/18/2021 FINDINGS: Normal lung volumes. No focal consolidations. No pleural effusion or pneumothorax. Enlarged cardiomediastinal silhouette is likely projectional. No acute osseous abnormality. IMPRESSION: No acute disease. Electronically Signed   By: Agustin Cree M.D.   On: 09/12/2023 10:00     Procedures Procedures   Medications Ordered in ED Medications  potassium chloride SA (KLOR-CON M) CR tablet 40 mEq (40 mEq Oral Given 09/12/23 1914)    ED Course/ Medical Decision Making/ A&P    Medical Decision Making Amount and/or Complexity of Data Reviewed Labs: ordered. Radiology: ordered.  Risk Prescription drug management.   58 y.o. female presents to the ER for evaluation of palpitations. Differential diagnosis includes but is not limited to Cardiac arrhythmias, ACS, CHF, pericarditis, valvular disease, panic/anxiety, ETOH, stimulant use, medication side effect, anemia, hyperthyroidism, pulmonary embolism. Vital signs unremarkable. Physical exam as noted above.   Monitor in the room, patient is having some PACs and PVCs, but no regularity to them.  Will order TSH as well as chest pain workup with EKG and chest x-ray.  I independently reviewed and interpreted the patient's labs.  TSH within normal limits, troponin within normal limits.   BMP does show hypokalemia 3.1.  Glucose at 103.  Otherwise no electrolyte abnormality.  CBC without leukocytosis or anemia.  CXR shows no acute cardiopulmonary process. Per radiologist's interpretation.    EKG reviewed and interpreted by my attending and read as Sinus rhythm Atrial premature complex Borderline prolonged PR interval.   The patient's primary care doctor's limit manages her diltiazem.  She did see cardiology at 1 time, however patient reports that she was told that she no longer needed to follow-up for this.  She is having few PVCs/PACs but there is no significant burden seen on cardiac monitoring when in the room.  She  is able to feel them while in watching the monitor.  Her lab work does not show any significant normality other than a low potassium.  This could be was worsening her palpitations.  Will orally replenish this here with 40 mill equivalents of p.o. potassium.  I discussed this case with my attending.  Recommended follow-up with her PCP for further management.  Doubt any ACS given reassuring troponins and EKG.  She is not having any shortness of breath worsening on exertion.  Only reports of momentary like catching her breath whenever she feels the palpitations but does not have trouble breathing.  I doubt a PE.  We discussed return precautions as well.  We discussed the results of the labs/imaging. The plan is follow up with PCP, take at home meds, return if any concern. We discussed strict return precautions and red flag symptoms. The patient verbalized their understanding and agrees to the plan. The patient is stable and being discharged home in good condition.  I discussed this case with my attending physician who cosigned this note including patient's presenting symptoms, physical exam, and planned diagnostics and interventions. Attending physician stated agreement with plan or made changes to plan which were implemented.   Portions of this report may have been transcribed  using voice recognition software. Every effort was made to ensure accuracy; however, inadvertent computerized transcription errors may be present.   Final Clinical Impression(s) / ED Diagnoses Final diagnoses:  Hypokalemia  Palpitations    Rx / DC Orders ED Discharge Orders     None         Achille Rich, PA-C 09/14/23 1211    Estelle June A, DO 09/19/23 1154

## 2023-09-12 NOTE — Discharge Instructions (Signed)
 You were seen in the ER today for evaluation of your heart palpitations.  You are having some premature contractions, likely why you are on the medication.  I would like for you to follow-up with your PCP for reevaluation of this of this as you may need medication adjustment.  Your potassium was also slightly low today which could exacerbate your palpitations.  Please make sure you call them later today to schedule an appointment.  If start having chest pain, shortness breath, you palpitations become worse or last longer, leg swelling, fever, please return to your nearest emerged part for evaluation.  If you have any other concerns, new or worsening symptoms, please return to your nearest emerged part for evaluation.  Contact a doctor if: You keep having fast or uneven heartbeats for a long time. Your symptoms happen more often. Get help right away if: You have chest pain. You feel short of breath. You have a very bad headache. You feel dizzy. You faint. These symptoms may be an emergency. Get help right away. Call your local emergency services (911 in the [place] Do not wait to see if the symptoms will go away. Do not drive yourself to the hospital.

## 2023-09-12 NOTE — ED Triage Notes (Signed)
 C/o heart fluttering for several days , today c/o sob and dizziness, states she was put on cardizem states she takes it everyday  however seems to not be working

## 2023-09-12 NOTE — ED Notes (Signed)
 Nt called CCMD to put pt on monitor

## 2023-09-15 ENCOUNTER — Other Ambulatory Visit: Payer: Self-pay

## 2023-09-15 ENCOUNTER — Other Ambulatory Visit

## 2023-09-15 ENCOUNTER — Other Ambulatory Visit: Payer: Self-pay | Admitting: *Deleted

## 2023-09-15 DIAGNOSIS — Z79899 Other long term (current) drug therapy: Secondary | ICD-10-CM

## 2023-09-15 DIAGNOSIS — G3781 Myelin oligodendrocyte glycoprotein antibody disease: Secondary | ICD-10-CM

## 2023-09-16 ENCOUNTER — Encounter: Payer: Self-pay | Admitting: Neurology

## 2023-09-16 LAB — CBC WITH DIFFERENTIAL/PLATELET
Basophils Absolute: 0.1 10*3/uL (ref 0.0–0.2)
Basos: 1 %
EOS (ABSOLUTE): 0 10*3/uL (ref 0.0–0.4)
Eos: 0 %
Hematocrit: 40.3 % (ref 34.0–46.6)
Hemoglobin: 13.1 g/dL (ref 11.1–15.9)
Immature Grans (Abs): 0 10*3/uL (ref 0.0–0.1)
Immature Granulocytes: 0 %
Lymphocytes Absolute: 1 10*3/uL (ref 0.7–3.1)
Lymphs: 9 %
MCH: 29.1 pg (ref 26.6–33.0)
MCHC: 32.5 g/dL (ref 31.5–35.7)
MCV: 90 fL (ref 79–97)
Monocytes Absolute: 0.1 10*3/uL (ref 0.1–0.9)
Monocytes: 1 %
Neutrophils Absolute: 9.6 10*3/uL — ABNORMAL HIGH (ref 1.4–7.0)
Neutrophils: 89 %
Platelets: 308 10*3/uL (ref 150–450)
RBC: 4.5 x10E6/uL (ref 3.77–5.28)
RDW: 12.5 % (ref 11.7–15.4)
WBC: 10.7 10*3/uL (ref 3.4–10.8)

## 2023-09-19 ENCOUNTER — Ambulatory Visit
Admission: RE | Admit: 2023-09-19 | Discharge: 2023-09-19 | Disposition: A | Discharge status: Home / Self Care | Payer: BC Managed Care – PPO | Source: Ambulatory Visit | Attending: Family Medicine | Admitting: Family Medicine

## 2023-10-06 IMAGING — MR MR ABDOMEN WO/W CM
18 series · 48 of 48 positions shown · IV contrast (14ml multihance)
Comparison: 03/08/2021

CLINICAL DATA: Follow-up right renal cyst

EXAM:
MRI ABDOMEN WITHOUT AND WITH CONTRAST
TECHNIQUE: Multiplanar multisequence MR imaging of the abdomen was performed
both before and after the administration of intravenous contrast.
CONTRAST:  14mL MULTIHANCE GADOBENATE DIMEGLUMINE 529 MG/ML IV SOLN

[Series 4: T2 · coronal · 5.0mm · 0.74mm/px · 1 of 24 slices shown (1 of 3)]
[im 1/24]
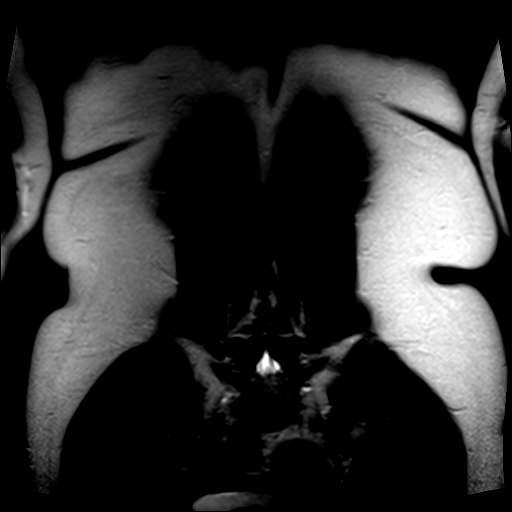

[Series 5: T2 · axial · 5.0mm · 0.68mm/px · 1 of 27 slices shown (2 of 3)]
[im 1/27]
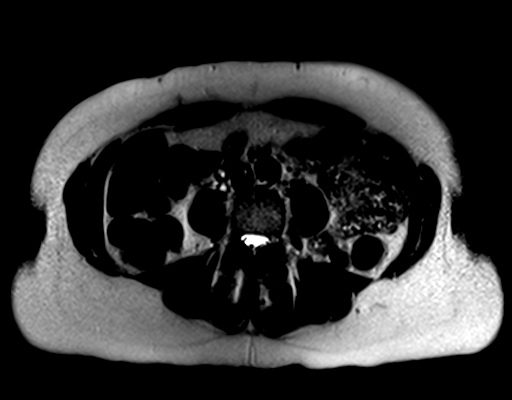

[Series 6: bSSFP · axial · 4.0mm · 0.68mm/px · 1 of 48 slices shown]
[im 1/48]
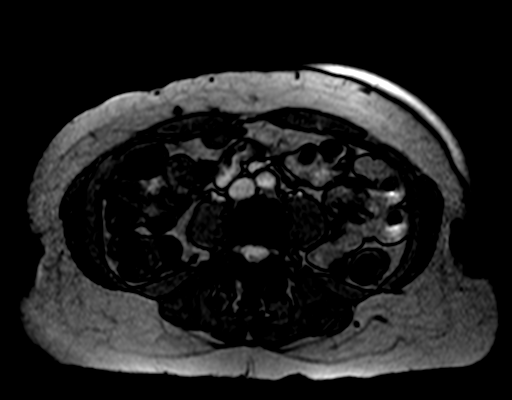

[Series 7: T2 · axial · 5.0mm · 1.37mm/px · 1 of 27 slices shown (3 of 3)]
[im 1/27]
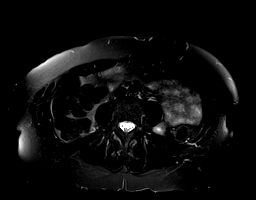

[Series 8: ep2d_diff_b50_500_800_p2_trig · axial · 5.0mm · 1.82mm/px · z∈[-20,+133]mm · 2 of 80 slices shown]
[im 1/80]
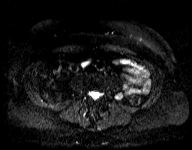
[im 80/80]
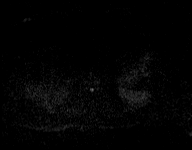

[Series 9: ep2d_diff_b50_500_800_p2_trig_adc · axial · 5.0mm · 1.82mm/px · 1 of 27 slices shown]
[im 1/27]
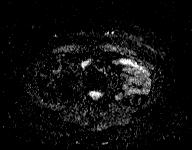

[Series 10: T1 dynamic · coronal · non-contrast · 2.5mm · 0.74mm/px · 1 of 52 slices shown (1 of 2)]
[im 1/52]
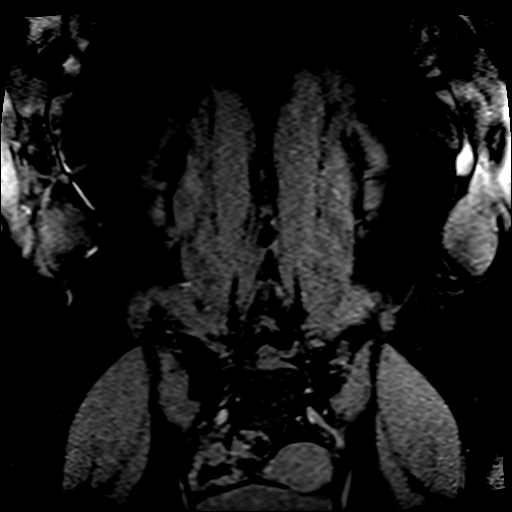

[Series 11: T1 · axial · 5.0mm · 0.68mm/px · z∈[-45,+110]mm · 2 of 54 slices shown]
[im 1/54]
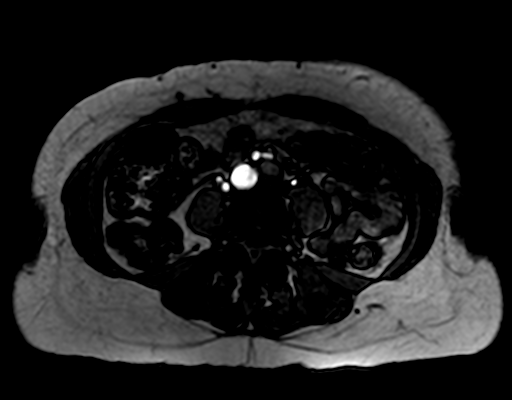
[im 54/54]
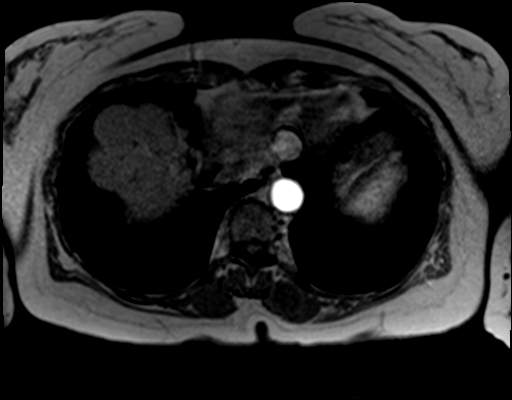

[Series 12: T1 dynamic · axial · non-contrast · 2.3mm · 1.37mm/px · z∈[-50,+148]mm · 4 of 88 slices shown (2 of 2)]
[im 1/88]
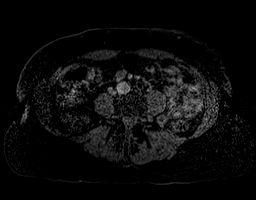
[im 30/88]
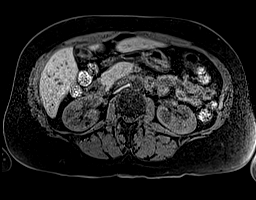
[im 59/88]
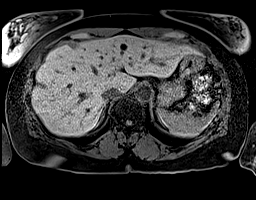
[im 88/88]
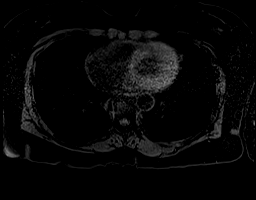

[Series 13: post 25 sec · axial · 2.3mm · 1.37mm/px · z∈[-50,+148]mm · 4 of 88 slices shown]
[im 1/88]
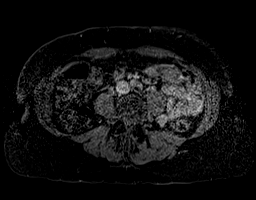
[im 30/88]
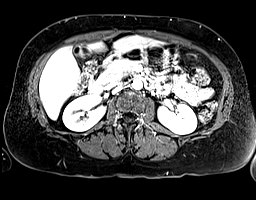
[im 59/88]
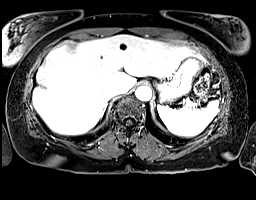
[im 88/88]
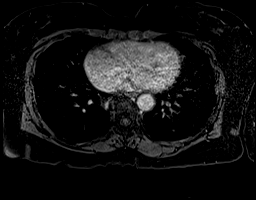

[Series 14: post 25 sec_sub · axial · 2.3mm · 1.37mm/px · z∈[-50,+148]mm · 4 of 88 slices shown]
[im 1/88]
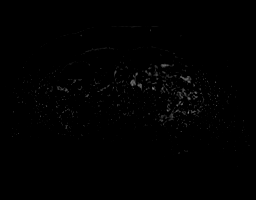
[im 30/88]
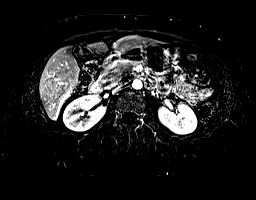
[im 59/88]
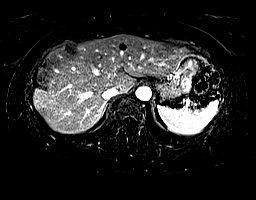
[im 88/88]
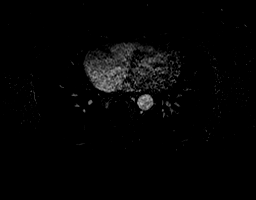

[Series 15: post 60 sec · axial · 2.3mm · 1.37mm/px · z∈[-50,+148]mm · 4 of 88 slices shown]
[im 1/88]
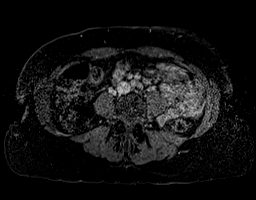
[im 30/88]
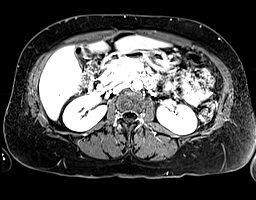
[im 59/88]
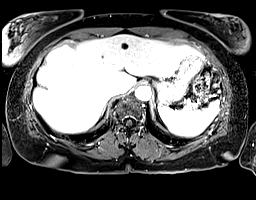
[im 88/88]
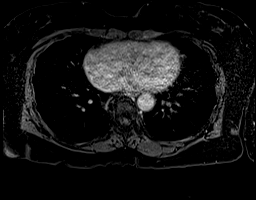

[Series 16: post 60 sec_sub · axial · 2.3mm · 1.37mm/px · z∈[-50,+148]mm · 4 of 88 slices shown]
[im 1/88]
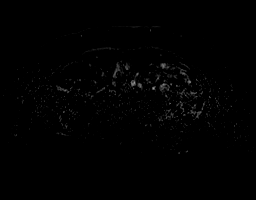
[im 30/88]
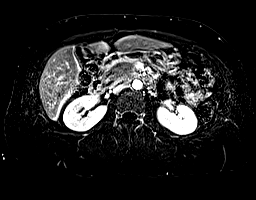
[im 59/88]
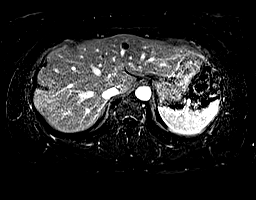
[im 88/88]
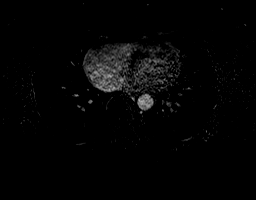

[Series 17: post 90 sec · axial · 2.3mm · 1.37mm/px · z∈[-50,+148]mm · 4 of 88 slices shown]
[im 1/88]
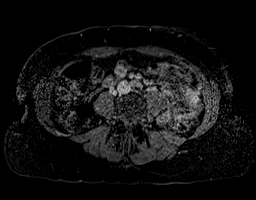
[im 30/88]
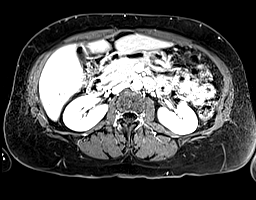
[im 59/88]
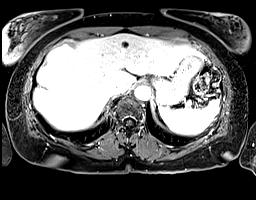
[im 88/88]
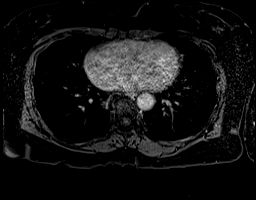

[Series 18: post 90 sec_sub · axial · 2.3mm · 1.37mm/px · z∈[-50,+148]mm · 4 of 88 slices shown]
[im 1/88]
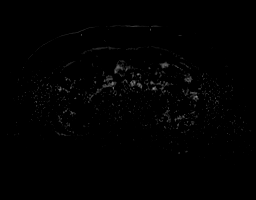
[im 30/88]
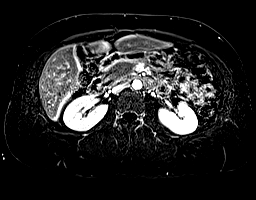
[im 59/88]
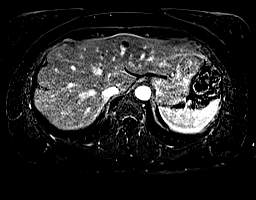
[im 88/88]
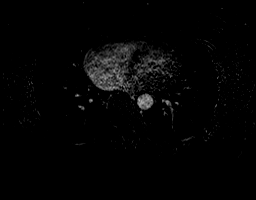

[Series 19: T1 dynamic post-contrast · coronal · 2.5mm · 0.74mm/px · 2 of 52 slices shown]
[im 1/52]
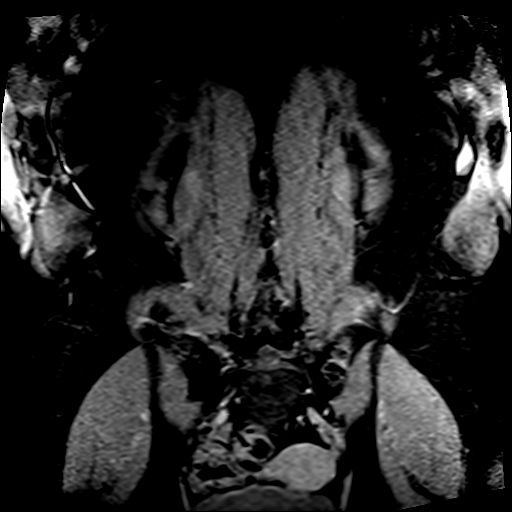
[im 52/52]
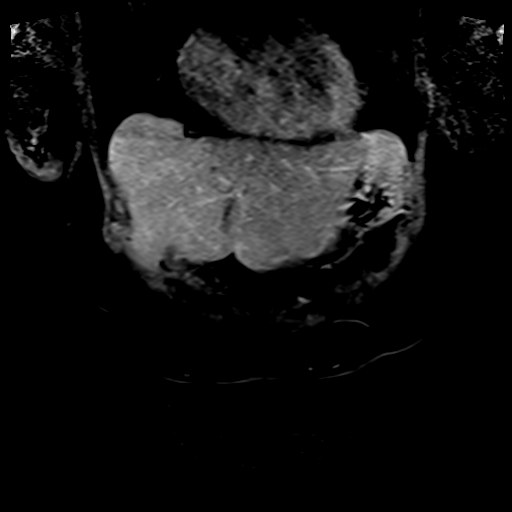

[Series 20: post axial 3+ · axial · 2.3mm · 1.37mm/px · z∈[-50,+148]mm · 4 of 88 slices shown (1 of 2)]
[im 1/88]
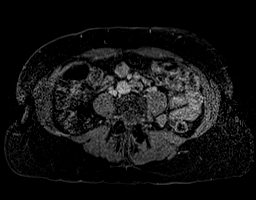
[im 30/88]
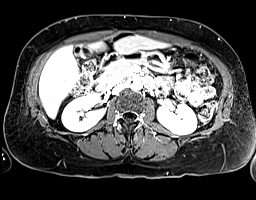
[im 59/88]
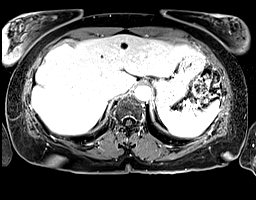
[im 88/88]
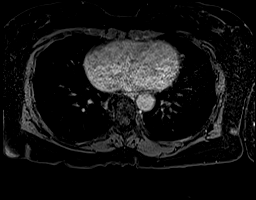

[Series 21: post axial 3+ · axial · 2.3mm · 1.37mm/px · z∈[-50,+148]mm · 4 of 88 slices shown (2 of 2)]
[im 1/88]
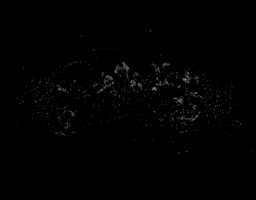
[im 30/88]
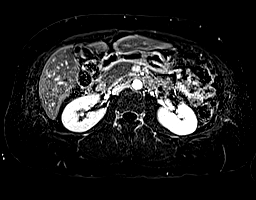
[im 59/88]
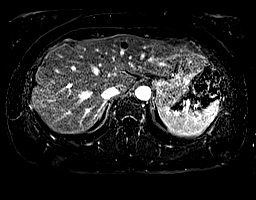
[im 88/88]
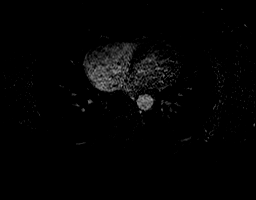

[48 of 48 positions shown; findings below may reference images not displayed]

FINDINGS: Lower chest: No acute findings.

Hepatobiliary: No mass or other parenchymal abnormality identified.
Multiple simple, benign liver cysts, for which no further follow-up
characterization is required. No gallstones. No biliary ductal
dilatation.

Pancreas: No mass, inflammatory changes, or other parenchymal
abnormality identified.No pancreatic ductal dilatation.

Spleen:  Within normal limits in size and appearance.

Adrenals/Urinary Tract: Normal adrenal glands. Unchanged exophytic
cyst of the inferior pole of the right kidney with a single thin,
perceptible contrast enhancing septation, lesion measuring 2.4 x
cm (series 15, image 80, series 19, image 21). Multiple additional
simple, definitively benign small renal cysts for which no further
characterization or follow-up is specifically required. No solid
renal masses identified. No evidence of hydronephrosis.

Stomach/Bowel: Visualized portions within the abdomen are
unremarkable.

Vascular/Lymphatic: No pathologically enlarged lymph nodes
identified. No abdominal aortic aneurysm demonstrated.

Other:  None.

Musculoskeletal: No suspicious osseous lesions identified.
IMPRESSION: Unchanged exophytic cyst of the inferior pole of the right kidney
with a single thin, perceptible contrast enhancing septation, lesion
measuring 2.4 x 2.4 cm. This remains Bosniak category IIF. Consider
follow-up in 6 months.

## 2024-03-15 ENCOUNTER — Other Ambulatory Visit: Payer: Self-pay

## 2024-03-15 DIAGNOSIS — G3781 Myelin oligodendrocyte glycoprotein antibody disease: Secondary | ICD-10-CM

## 2024-03-16 ENCOUNTER — Other Ambulatory Visit (INDEPENDENT_AMBULATORY_CARE_PROVIDER_SITE_OTHER): Payer: Self-pay

## 2024-03-16 DIAGNOSIS — G3781 Myelin oligodendrocyte glycoprotein antibody disease: Secondary | ICD-10-CM

## 2024-03-16 DIAGNOSIS — Z0289 Encounter for other administrative examinations: Secondary | ICD-10-CM

## 2024-03-17 ENCOUNTER — Ambulatory Visit: Payer: Self-pay | Admitting: Neurology

## 2024-03-17 LAB — CBC WITH DIFFERENTIAL/PLATELET
Basophils Absolute: 0 x10E3/uL (ref 0.0–0.2)
Basos: 0 %
EOS (ABSOLUTE): 0 x10E3/uL (ref 0.0–0.4)
Eos: 0 %
Hematocrit: 41.9 % (ref 34.0–46.6)
Hemoglobin: 13.2 g/dL (ref 11.1–15.9)
Immature Grans (Abs): 0 x10E3/uL (ref 0.0–0.1)
Immature Granulocytes: 0 %
Lymphocytes Absolute: 1.1 x10E3/uL (ref 0.7–3.1)
Lymphs: 6 %
MCH: 28.9 pg (ref 26.6–33.0)
MCHC: 31.5 g/dL (ref 31.5–35.7)
MCV: 92 fL (ref 79–97)
Monocytes Absolute: 1.1 x10E3/uL — ABNORMAL HIGH (ref 0.1–0.9)
Monocytes: 6 %
Neutrophils Absolute: 17.1 x10E3/uL — ABNORMAL HIGH (ref 1.4–7.0)
Neutrophils: 88 %
Platelets: 320 x10E3/uL (ref 150–450)
RBC: 4.56 x10E6/uL (ref 3.77–5.28)
RDW: 12.8 % (ref 11.7–15.4)
WBC: 19.4 x10E3/uL — ABNORMAL HIGH (ref 3.4–10.8)

## 2024-03-17 LAB — IGG, IGA, IGM
IgA/Immunoglobulin A, Serum: 331 mg/dL (ref 87–352)
IgG (Immunoglobin G), Serum: 1146 mg/dL (ref 586–1602)
IgM (Immunoglobulin M), Srm: 103 mg/dL (ref 26–217)

## 2024-04-12 ENCOUNTER — Ambulatory Visit (INDEPENDENT_AMBULATORY_CARE_PROVIDER_SITE_OTHER): Admitting: Neurology

## 2024-04-12 ENCOUNTER — Encounter: Payer: Self-pay | Admitting: Neurology

## 2024-04-12 VITALS — BP 124/77 | Ht 66.0 in | Wt 145.0 lb

## 2024-04-12 DIAGNOSIS — Z79899 Other long term (current) drug therapy: Secondary | ICD-10-CM | POA: Diagnosis not present

## 2024-04-12 DIAGNOSIS — Z8661 Personal history of infections of the central nervous system: Secondary | ICD-10-CM | POA: Diagnosis not present

## 2024-04-12 DIAGNOSIS — Z8669 Personal history of other diseases of the nervous system and sense organs: Secondary | ICD-10-CM

## 2024-04-12 DIAGNOSIS — G3781 Myelin oligodendrocyte glycoprotein antibody disease: Secondary | ICD-10-CM | POA: Diagnosis not present

## 2024-04-12 DIAGNOSIS — E559 Vitamin D deficiency, unspecified: Secondary | ICD-10-CM

## 2024-04-12 NOTE — Progress Notes (Signed)
 [facility]  PATIENT: Lauren [name] DOB: 1966-01-12  REFERRING DOCTOR OR PCP:  Almarie Scala SOURCE:   _________________________________   HISTORICAL  CHIEF COMPLAINT:  Chief Complaint  Patient presents with   RM 10    MOG; patient reports doing well; no complaints; alone    HISTORY OF PRESENT ILLNESS:  Evelene Maisel, at the MS center at [facility] for neurologic consultation regarding her transverse myelitis and recent diagnosis of anti-MOG positive demyelination.  Update 04/12/2024 She has been on Rituximab  and her next last infusion was 03/29/2024     She tolerates it well.   No infections.   MOGAD is doing well with no exacerbations or new neurologic symptom.  . When last checked, the anti-MOG antibodies were down to nondetectable.  B cells were at 0% right before one of the 2024 infusion  Gait is doing well.   She walks at work the entire night (third shift at [employer] as [employer]).   Balance is mildly reduced but she can go up and down stairs and ladders easily.  She keeps up with others on long walks.   She denies any new weakness.   She denies any change in bladder functions.  She has no numbness now.   Bladder function is fine.     Vision is back to baseline and symmetric colors now.   She sees optometry annually.     Fatigue is mild.    She sleeps well most days (third shift work).    Cognition is fine.  She denies depression or anxiety.    On vit D 5000 U daily supplement  MOGAD History: She had transverse myelitis 2 days after receiving the first Pfizer vaccination 09/18/2019.   On 09/20/2019, her left leg went numb.  Over the next 2 days, the right leg started to get numb and the left leg numbness worsen and she became weaker.   She was able to walk though she felt she had a limp.  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  She did not have any numbness or weakness in the arms.  Because  symptoms persisted, she went to Urgent care 09/28/19 and had standard labs that were fine.  She was prescribed 20 mg prednisone  x 5 days and advised to call back if not better.  Symptoms persisted and she was referred to Dr. Maree..  He saw her 10/15/19 and then sent her that day to [facility] due to Brown-Sequard like symptoms.   She was seen in the emergency room and then admitted.  She was treated with IV Solumedrol for 5 days.   She had a lumbar puncture and her CSF showed oligoclonal bands (9).  Additionally, she had testing for neuromyelitis optica and is anti-MOG positive.  She is anti-NMO negative.   The anti-Mog antibody test had not returned yet when she was discharged.  She followed up with Dr. Maree and is referred for possible DMT therapy.    She received 5 days IV Solumedrol with improvement.   She had the onset of right eye pain about a week ago, worse with movements and over the next couple days she lost vision in both eyes, right worse than left, consistent with optic neuritis..  At the visit, 01/10/2020, she had perception of shapes but could not count fingers in the majority of the visual field and was able to count fingers in the upper nasal quadrant.  On the  left, she had mildly reduced vision but could read.  She received 4 days of IV Solu-Medrol  and vision improved over the next couple weeks and has continued to improve towards baseline.  Imaging: MRI of the cervical and thoracic spine 10/16/2019 showed an enhancing lesion to the left of midline from T10 to T11.  The cervical spine was normal.  MRI of the brain showed some nonspecific white matter foci in the subcortical and deep white matter but no acute findings.  Laboratory test: 01/10/2020: Hepatitis B core and surface antibodies were negative 10/16/2019: CSF showed 9 oligoclonal bands unique to the CSF. 10/17/2019: Anti-NMO was negative and anti-MOG was positive 1:100 ([facility]).  ESR and ANA were negative.  CCP IgG/IgAwas negative.  Quant TB  negative    REVIEW OF SYSTEMS: Constitutional: No fevers, chills, sweats, or change in appetite Eyes: No visual changes, double vision, eye pain Ear, nose and throat: No hearing loss, ear pain, nasal congestion, sore throat Cardiovascular: No chest pain, palpitations Respiratory:  No shortness of breath at rest or with exertion.   No wheezes GastrointestinaI: No nausea, vomiting, diarrhea, abdominal pain, fecal incontinence Genitourinary:  No dysuria, urinary retention or frequency.  No nocturia. Musculoskeletal:  No neck pain, back pain Integumentary: No rash, pruritus, skin lesions Neurological: as above Psychiatric: No depression at this time.  No anxiety Endocrine: No palpitations, diaphoresis, change in appetite, change in weigh or increased thirst Hematologic/Lymphatic:  No anemia, purpura, petechiae. Allergic/Immunologic: No itchy/runny eyes, nasal congestion, recent allergic reactions, rashes  ALLERGIES: Allergies  Allergen Reactions   Banana (Diagnostic) Anaphylaxis   Penicillins Anaphylaxis, Hives and Swelling    HOME MEDICATIONS:  Current Outpatient Medications:    diltiazem  (CARDIZEM ) 30 MG tablet, Take 1 tablet (30 mg total) by mouth 4 (four) times daily as needed (palpitations/fast heart rate)., Disp: 60 tablet, Rfl: 3   diltiazem  (TIAZAC ) 420 MG 24 hr capsule, Take 420 mg by mouth daily., Disp: , Rfl:    rosuvastatin (CRESTOR) 5 MG tablet, Take 5 mg by mouth at bedtime., Disp: , Rfl:    Wheat Dextrin (BENEFIBER PO), Take 2 Scoops by mouth as needed., Disp: , Rfl:   PAST MEDICAL HISTORY: Past Medical History:  Diagnosis Date   Allergy    seasonal   Boils 12/15/2010   multiple axilla   Carpal tunnel syndrome of left wrist    Chronic headaches 2008    improving   Constipation    Constipation 03/19/2015   DDD (degenerative disc disease), cervical    De Quervain's disease (tenosynovitis)    Right per pt   Encounter for Papanicolaou smear for cervical cancer  screening 12/08/2016   Epigastric pain 09/21/2017   Heart murmur    History of Heart murmur as child.   Heart palpitations    Heartburn    Occ   HLD (hyperlipidemia) 2008   Hypertension    Menorrhagia    OA (osteoarthritis) of neck    Palpable abdominal aorta 09/21/2017   Right hip pain    Right ovarian cyst 11/16/2009   2 small, noted on pelvic US    Seizures (HCC)    history of one time   Shoulder pain, right 07/2008   Ventral hernia    Weight loss 12/30/2017    PAST SURGICAL HISTORY: Past Surgical History:  Procedure Laterality Date   COLONOSCOPY W/ POLYPECTOMY  12/24/2015   INSERTION OF MESH N/A 10/25/2017   Procedure: INSERTION OF MESH;  Surgeon: Stevie Herlene Righter, MD;  Location: [facility]  [place];  Service: General;  Laterality: N/A;   LEFT HEART CATH AND CORONARY ANGIOGRAPHY N/A 08/06/2020   Procedure: LEFT HEART CATH AND CORONARY ANGIOGRAPHY;  Surgeon: Dann Candyce RAMAN, MD;  Location: [facility] INVASIVE CV LAB;  Service: Cardiovascular;  Laterality: N/A;   VENTRAL HERNIA REPAIR N/A 10/25/2017   Procedure: LAPAROSCOPIC VENTRAL HERNIA;  Surgeon: Stevie, Herlene Righter, MD;  Location: [facility] ;  Service: General;  Laterality: N/A;    FAMILY HISTORY: Family History  Problem Relation Age of Onset   Brain cancer Mother    Heart failure Father    Cirrhosis Brother        alcohol related   Heart disease Maternal Grandmother    Breast cancer Neg Hx    Colon cancer Neg Hx    Esophageal cancer Neg Hx    Stomach cancer Neg Hx     SOCIAL HISTORY:  Social History   Socioeconomic History   Marital status: Media Planner    Spouse name: Not on file   Number of children: Not on file   Years of education: Not on file   Highest education level: Not on file  Occupational History   Not on file  Tobacco Use   Smoking status: Former    Current packs/day: 0.30    Average packs/day: 0.3 packs/day for 36.0 years (10.8 ttl pk-yrs)    Types: Cigarettes    Smokeless tobacco: Never   Tobacco comments:    1/2 pack per day  Vaping Use   Vaping status: Never Used  Substance and Sexual Activity   Alcohol use: Not Currently    Comment: seldom   Drug use: Not Currently    Types: Marijuana    Comment: 2-3 times per week for pain   Sexual activity: Yes    Birth control/protection: Post-menopausal  Other Topics Concern   Not on file  Social History Narrative   Works as Government social research officer at [employer], lives with significant other in [place]   No EToh, former beer drinker   No illicit drug use.   Current smoker, 1/2 PPDx 26 years   Caffeine use: none   Right handed    Social Drivers of Corporate Investment Banker Strain: Not on file  Food Insecurity: Not on file  Transportation Needs: Not on file  Physical Activity: Not on file  Stress: Not on file  Social Connections: Not on file  Intimate Partner Violence: Not on file     PHYSICAL EXAM  Vitals:   04/12/24 0827  BP: 124/77  Weight: 145 lb (65.8 kg)  Height: 5' 6 (1.676 m)    Body mass index is 23.4 kg/m.  VA 20/30 OD VA 20/50 OS  General: The patient is well-developed and well-nourished and in no acute distress  HEENT:  Head is [place]/AT.  Sclera are anicteri  Skin: Extremities are without rash or  edema.   Neurologic Exam  Mental status: The patient is alert and oriented x 3 at the time of the examination. The patient has apparent normal recent and remote memory, with an apparently normal attention span and concentration ability.   Speech is normal.  Cranial nerves: Extraocular movements are full.  She has mild ptosis, right greater than left.  Color vision was symmetric.   Visual fields now FTC.  Facial strength and sensation were fine.   No obvious hearing deficits are noted.  Motor:  Muscle bulk is normal.   Tone is Normal.  Strength is 5/5 now.    Sensory: Sensory testing is intact to pinprick, soft touch and vibration sensation in the arms and  legs  Coordination: Cerebellar testing reveals good finger-nose-finger and heel-to-shin bilaterally   Gait and station: Station is normal.   She has normal gait though tandem is mildly wide.   Romberg is negative     Reflexes: Deep tendon reflexes are symmetric and normal bilaterally.       DIAGNOSTIC DATA (LABS, IMAGING, TESTING) - I reviewed patient records, labs, notes, testing and imaging myself where available.  Lab Results  Component Value Date   WBC 19.4 (H) 03/16/2024   HGB 13.2 03/16/2024   HCT 41.9 03/16/2024   MCV 92 03/16/2024   PLT 320 03/16/2024      Component Value Date/Time   NA 140 09/12/2023 0813   NA 140 07/29/2020 1016   K 3.1 (L) 09/12/2023 0813   CL 105 09/12/2023 0813   CO2 25 09/12/2023 0813   GLUCOSE 103 (H) 09/12/2023 0813   BUN 12 09/12/2023 0813   BUN 10 07/29/2020 1016   CREATININE 0.68 09/12/2023 0813   CREATININE 0.76 06/01/2013 1120   CALCIUM 9.4 09/12/2023 0813   PROT 7.2 02/18/2021 1708   PROT 7.8 12/28/2017 1614   ALBUMIN 4.0 02/18/2021 1708   ALBUMIN 4.3 12/28/2017 1614   AST 27 02/18/2021 1708   ALT 23 02/18/2021 1708   ALKPHOS 53 02/18/2021 1708   BILITOT 0.6 02/18/2021 1708   BILITOT 0.2 12/28/2017 1614   GFRNONAA >60 09/12/2023 0813   GFRNONAA >89 06/01/2013 1120   GFRAA 108 07/29/2020 1016   GFRAA >89 06/01/2013 1120   Lab Results  Component Value Date   CHOL 158 10/08/2015   HDL 41 10/08/2015   LDLCALC 101 (H) 10/08/2015   TRIG 78 10/08/2015   CHOLHDL 3.9 10/08/2015   No results found for: HGBA1C Lab Results  Component Value Date   VITAMINB12 >2000 (H) 07/29/2020   Lab Results  Component Value Date   TSH 1.335 09/12/2023       ASSESSMENT AND PLAN  MOG antibody disease (HCC)  High risk medication use  History of optic neuritis  History of myelitis  Vitamin D deficiency   1.   She has not had any more episodes of transverse myelitis or optic neuritis.  She has anti-MOG antibodies c/w MOGAD and  oligoclonal bands.  This was seen in about 13% of adults (Jarius 2020), usually during a relapse, in 1 study of anti-MOG  .  She has done very well on rituximab .   As her Mogad may have been post-vaccination, may be able to discontinue at some point 2.  Change Rituxan to 1000 mg IV q 24 weeks (is currently on 2 doses every 24 weeks).  IgG/IgM, CBC with differential were fine.   3.  Return in 5 months.    Will need IgG/IgM and CBC/D  and CD20/19 at that time   Arian Mcquitty A. Vear, MD, [facility] 04/12/2024, 9:08 AM Certified in Neurology, Clinical Neurophysiology, Sleep Medicine and Neuroimaging  [facility] [address], [address] [place], [place] [address] [phone]

## 2024-07-20 ENCOUNTER — Telehealth: Payer: Self-pay | Admitting: Neurology

## 2024-07-20 NOTE — Telephone Encounter (Signed)
 Patient brought in paperwork from new [employer]. Per letter as of April 1st 2026 [employer]  [phone]) will no longer be covered on medical benefit plan, alternative options they will cover are Riabni [phone]) and Tru[employer]a  [phone]). Made copy of letter and gave to pt, brought letter back to POD

## 2024-08-15 ENCOUNTER — Ambulatory Visit: Admitting: Neurology
